# Patient Record
Sex: Female | Born: 1965 | Race: White | Hispanic: No | Smoking: Never smoker
Health system: Southern US, Community
[De-identification: ages and names within clinical notes are randomized; demographics above are authoritative.]

## PROBLEM LIST (undated history)

## (undated) DIAGNOSIS — F32A Depression, unspecified: Secondary | ICD-10-CM

## (undated) DIAGNOSIS — T8859XA Other complications of anesthesia, initial encounter: Secondary | ICD-10-CM

## (undated) DIAGNOSIS — Z9889 Other specified postprocedural states: Secondary | ICD-10-CM

## (undated) DIAGNOSIS — K219 Gastro-esophageal reflux disease without esophagitis: Secondary | ICD-10-CM

## (undated) DIAGNOSIS — C50919 Malignant neoplasm of unspecified site of unspecified female breast: Secondary | ICD-10-CM

## (undated) DIAGNOSIS — F329 Major depressive disorder, single episode, unspecified: Secondary | ICD-10-CM

## (undated) DIAGNOSIS — Z9221 Personal history of antineoplastic chemotherapy: Secondary | ICD-10-CM

## (undated) DIAGNOSIS — F419 Anxiety disorder, unspecified: Secondary | ICD-10-CM

## (undated) DIAGNOSIS — Z9882 Breast implant status: Secondary | ICD-10-CM

## (undated) DIAGNOSIS — IMO0002 Reserved for concepts with insufficient information to code with codable children: Secondary | ICD-10-CM

## (undated) DIAGNOSIS — R112 Nausea with vomiting, unspecified: Secondary | ICD-10-CM

## (undated) DIAGNOSIS — T4145XA Adverse effect of unspecified anesthetic, initial encounter: Secondary | ICD-10-CM

## (undated) DIAGNOSIS — Z923 Personal history of irradiation: Secondary | ICD-10-CM

## (undated) HISTORY — DX: Breast implant status: Z98.82

## (undated) HISTORY — PX: OTHER SURGICAL HISTORY: SHX169

## (undated) HISTORY — DX: Malignant neoplasm of unspecified site of unspecified female breast: C50.919

## (undated) HISTORY — DX: Personal history of antineoplastic chemotherapy: Z92.21

## (undated) HISTORY — PX: PORTACATH PLACEMENT: SHX2246

## (undated) HISTORY — DX: Personal history of irradiation: Z92.3

## (undated) HISTORY — DX: Gastro-esophageal reflux disease without esophagitis: K21.9

## (undated) HISTORY — PX: TONSILLECTOMY AND ADENOIDECTOMY: SUR1326

---

## 2000-11-04 ENCOUNTER — Other Ambulatory Visit: Admission: RE | Admit: 2000-11-04 | Discharge: 2000-11-04 | Payer: Self-pay | Admitting: Obstetrics & Gynecology

## 2000-12-15 DIAGNOSIS — C50919 Malignant neoplasm of unspecified site of unspecified female breast: Secondary | ICD-10-CM

## 2000-12-15 HISTORY — DX: Malignant neoplasm of unspecified site of unspecified female breast: C50.919

## 2001-11-08 ENCOUNTER — Other Ambulatory Visit: Admission: RE | Admit: 2001-11-08 | Discharge: 2001-11-08 | Payer: Self-pay | Admitting: Obstetrics & Gynecology

## 2001-11-10 ENCOUNTER — Encounter: Payer: Self-pay | Admitting: Obstetrics & Gynecology

## 2001-11-10 ENCOUNTER — Encounter (INDEPENDENT_AMBULATORY_CARE_PROVIDER_SITE_OTHER): Payer: Self-pay | Admitting: *Deleted

## 2001-11-10 ENCOUNTER — Encounter: Admission: RE | Admit: 2001-11-10 | Discharge: 2001-11-10 | Payer: Self-pay | Admitting: Obstetrics & Gynecology

## 2001-11-10 ENCOUNTER — Other Ambulatory Visit: Admission: RE | Admit: 2001-11-10 | Discharge: 2001-11-10 | Payer: Self-pay | Admitting: Obstetrics & Gynecology

## 2001-11-12 ENCOUNTER — Encounter: Admission: RE | Admit: 2001-11-12 | Discharge: 2001-11-12 | Payer: Self-pay | Admitting: Obstetrics & Gynecology

## 2001-11-12 ENCOUNTER — Encounter: Payer: Self-pay | Admitting: Obstetrics & Gynecology

## 2001-11-14 HISTORY — PX: BREAST LUMPECTOMY: SHX2

## 2001-11-14 HISTORY — PX: SENTINEL LYMPH NODE BIOPSY: SHX2392

## 2001-11-17 ENCOUNTER — Encounter: Payer: Self-pay | Admitting: Surgery

## 2001-11-17 ENCOUNTER — Encounter: Admission: RE | Admit: 2001-11-17 | Discharge: 2001-11-17 | Payer: Self-pay | Admitting: Surgery

## 2001-11-17 ENCOUNTER — Ambulatory Visit: Admission: RE | Admit: 2001-11-17 | Discharge: 2002-02-15 | Payer: Self-pay | Admitting: Radiation Oncology

## 2001-11-19 ENCOUNTER — Encounter (INDEPENDENT_AMBULATORY_CARE_PROVIDER_SITE_OTHER): Payer: Self-pay | Admitting: Specialist

## 2001-11-19 ENCOUNTER — Encounter: Admission: RE | Admit: 2001-11-19 | Discharge: 2001-11-19 | Payer: Self-pay | Admitting: Otolaryngology

## 2001-11-19 ENCOUNTER — Ambulatory Visit (HOSPITAL_BASED_OUTPATIENT_CLINIC_OR_DEPARTMENT_OTHER): Admission: RE | Admit: 2001-11-19 | Discharge: 2001-11-19 | Payer: Self-pay | Admitting: Surgery

## 2001-11-19 ENCOUNTER — Encounter: Payer: Self-pay | Admitting: Surgery

## 2001-12-15 HISTORY — PX: BREAST ENHANCEMENT SURGERY: SHX7

## 2001-12-16 ENCOUNTER — Encounter: Payer: Self-pay | Admitting: *Deleted

## 2001-12-16 ENCOUNTER — Ambulatory Visit (HOSPITAL_COMMUNITY): Admission: RE | Admit: 2001-12-16 | Discharge: 2001-12-16 | Payer: Self-pay | Admitting: *Deleted

## 2001-12-17 ENCOUNTER — Ambulatory Visit (HOSPITAL_BASED_OUTPATIENT_CLINIC_OR_DEPARTMENT_OTHER): Admission: RE | Admit: 2001-12-17 | Discharge: 2001-12-17 | Payer: Self-pay | Admitting: Surgery

## 2001-12-18 ENCOUNTER — Emergency Department (HOSPITAL_COMMUNITY): Admission: EM | Admit: 2001-12-18 | Discharge: 2001-12-18 | Payer: Self-pay | Admitting: Emergency Medicine

## 2001-12-18 ENCOUNTER — Encounter: Payer: Self-pay | Admitting: Surgery

## 2001-12-19 ENCOUNTER — Emergency Department (HOSPITAL_COMMUNITY): Admission: EM | Admit: 2001-12-19 | Discharge: 2001-12-19 | Payer: Self-pay | Admitting: Emergency Medicine

## 2002-02-28 ENCOUNTER — Ambulatory Visit: Admission: RE | Admit: 2002-02-28 | Discharge: 2002-05-29 | Payer: Self-pay | Admitting: Radiation Oncology

## 2002-11-09 ENCOUNTER — Other Ambulatory Visit: Admission: RE | Admit: 2002-11-09 | Discharge: 2002-11-09 | Payer: Self-pay | Admitting: Obstetrics & Gynecology

## 2002-11-14 ENCOUNTER — Encounter: Payer: Self-pay | Admitting: Oncology

## 2002-11-14 ENCOUNTER — Encounter: Admission: RE | Admit: 2002-11-14 | Discharge: 2002-11-14 | Payer: Self-pay | Admitting: Oncology

## 2003-01-05 ENCOUNTER — Ambulatory Visit: Admission: RE | Admit: 2003-01-05 | Discharge: 2003-01-05 | Payer: Self-pay | Admitting: Radiation Oncology

## 2003-05-23 ENCOUNTER — Encounter: Admission: RE | Admit: 2003-05-23 | Discharge: 2003-05-23 | Payer: Self-pay | Admitting: Oncology

## 2003-05-23 ENCOUNTER — Encounter: Payer: Self-pay | Admitting: Oncology

## 2003-11-13 ENCOUNTER — Other Ambulatory Visit: Admission: RE | Admit: 2003-11-13 | Discharge: 2003-11-13 | Payer: Self-pay | Admitting: Obstetrics & Gynecology

## 2003-11-17 ENCOUNTER — Encounter: Admission: RE | Admit: 2003-11-17 | Discharge: 2003-11-17 | Payer: Self-pay | Admitting: Obstetrics & Gynecology

## 2004-11-21 ENCOUNTER — Other Ambulatory Visit: Admission: RE | Admit: 2004-11-21 | Discharge: 2004-11-21 | Payer: Self-pay | Admitting: Obstetrics & Gynecology

## 2004-11-22 ENCOUNTER — Encounter: Admission: RE | Admit: 2004-11-22 | Discharge: 2004-11-22 | Payer: Self-pay | Admitting: Surgery

## 2004-11-22 ENCOUNTER — Other Ambulatory Visit: Admission: RE | Admit: 2004-11-22 | Discharge: 2004-11-22 | Payer: Self-pay | Admitting: Obstetrics & Gynecology

## 2005-01-20 ENCOUNTER — Ambulatory Visit: Payer: Self-pay | Admitting: Oncology

## 2005-07-28 ENCOUNTER — Ambulatory Visit: Payer: Self-pay | Admitting: Oncology

## 2005-09-16 ENCOUNTER — Ambulatory Visit: Payer: Self-pay | Admitting: Oncology

## 2005-11-24 ENCOUNTER — Encounter: Admission: RE | Admit: 2005-11-24 | Discharge: 2005-11-24 | Payer: Self-pay | Admitting: Oncology

## 2005-11-24 ENCOUNTER — Other Ambulatory Visit: Admission: RE | Admit: 2005-11-24 | Discharge: 2005-11-24 | Payer: Self-pay | Admitting: Obstetrics & Gynecology

## 2006-01-26 ENCOUNTER — Ambulatory Visit: Payer: Self-pay | Admitting: Oncology

## 2006-02-07 ENCOUNTER — Encounter: Admission: RE | Admit: 2006-02-07 | Discharge: 2006-02-07 | Payer: Self-pay | Admitting: Oncology

## 2006-06-29 ENCOUNTER — Encounter: Admission: RE | Admit: 2006-06-29 | Discharge: 2006-06-29 | Payer: Self-pay | Admitting: Oncology

## 2006-07-23 ENCOUNTER — Ambulatory Visit: Payer: Self-pay | Admitting: Oncology

## 2006-07-28 LAB — CBC WITH DIFFERENTIAL/PLATELET
BASO%: 0.3 % (ref 0.0–2.0)
Basophils Absolute: 0 10*3/uL (ref 0.0–0.1)
EOS%: 3.1 % (ref 0.0–7.0)
Eosinophils Absolute: 0.2 10*3/uL (ref 0.0–0.5)
MCH: 29.9 pg (ref 26.0–34.0)
MCHC: 34.1 g/dL (ref 32.0–36.0)
MCV: 87.6 fL (ref 81.0–101.0)
MONO#: 0.5 10*3/uL (ref 0.1–0.9)
NEUT#: 4.3 10*3/uL (ref 1.5–6.5)
Platelets: 240 10*3/uL (ref 145–400)
RDW: 14 % (ref 11.3–14.5)
lymph#: 2.3 10*3/uL (ref 0.9–3.3)

## 2006-07-28 LAB — COMPREHENSIVE METABOLIC PANEL
ALT: 15 U/L (ref 0–40)
Albumin: 4.7 g/dL (ref 3.5–5.2)
Calcium: 9.5 mg/dL (ref 8.4–10.5)
Chloride: 103 mEq/L (ref 96–112)
Potassium: 3.7 mEq/L (ref 3.5–5.3)

## 2006-11-30 ENCOUNTER — Encounter: Admission: RE | Admit: 2006-11-30 | Discharge: 2006-11-30 | Payer: Self-pay | Admitting: Oncology

## 2007-01-14 ENCOUNTER — Ambulatory Visit: Payer: Self-pay | Admitting: Oncology

## 2007-01-19 LAB — COMPREHENSIVE METABOLIC PANEL
AST: 16 U/L (ref 0–37)
Albumin: 4.3 g/dL (ref 3.5–5.2)
CO2: 23 mEq/L (ref 19–32)
Chloride: 105 mEq/L (ref 96–112)
Creatinine, Ser: 0.87 mg/dL (ref 0.40–1.20)
Glucose, Bld: 88 mg/dL (ref 70–99)

## 2007-01-19 LAB — CBC WITH DIFFERENTIAL/PLATELET
EOS%: 2.9 % (ref 0.0–7.0)
HGB: 13.7 g/dL (ref 11.6–15.9)
LYMPH%: 32.4 % (ref 14.0–48.0)
MCHC: 34.9 g/dL (ref 32.0–36.0)
MCV: 87 fL (ref 81.0–101.0)
MONO#: 0.6 10*3/uL (ref 0.1–0.9)
MONO%: 8.1 % (ref 0.0–13.0)
NEUT#: 4.2 10*3/uL (ref 1.5–6.5)
NEUT%: 56.2 % (ref 39.6–76.8)
Platelets: 252 10*3/uL (ref 145–400)
RBC: 4.49 10*6/uL (ref 3.70–5.32)
RDW: 13.9 % (ref 11.3–14.5)
lymph#: 2.4 10*3/uL (ref 0.9–3.3)

## 2007-07-16 ENCOUNTER — Ambulatory Visit: Payer: Self-pay | Admitting: Oncology

## 2007-07-20 LAB — COMPREHENSIVE METABOLIC PANEL
ALT: 16 U/L (ref 0–35)
Alkaline Phosphatase: 60 U/L (ref 39–117)
CO2: 17 mEq/L — ABNORMAL LOW (ref 19–32)
Creatinine, Ser: 0.88 mg/dL (ref 0.40–1.20)
Total Bilirubin: 0.7 mg/dL (ref 0.3–1.2)

## 2007-07-20 LAB — CBC WITH DIFFERENTIAL/PLATELET
BASO%: 0.3 % (ref 0.0–2.0)
HCT: 40.2 % (ref 34.8–46.6)
LYMPH%: 21.3 % (ref 14.0–48.0)
MCH: 30.8 pg (ref 26.0–34.0)
MCHC: 34.6 g/dL (ref 32.0–36.0)
MCV: 88.9 fL (ref 81.0–101.0)
MONO#: 0.6 10*3/uL (ref 0.1–0.9)
MONO%: 7.4 % (ref 0.0–13.0)
NEUT%: 68.4 % (ref 39.6–76.8)
Platelets: 203 10*3/uL (ref 145–400)
WBC: 7.5 10*3/uL (ref 3.9–10.0)

## 2007-07-20 LAB — CANCER ANTIGEN 27.29: CA 27.29: 16 U/mL (ref 0–39)

## 2007-12-02 ENCOUNTER — Encounter: Admission: RE | Admit: 2007-12-02 | Discharge: 2007-12-02 | Payer: Self-pay | Admitting: Oncology

## 2008-01-14 ENCOUNTER — Ambulatory Visit: Payer: Self-pay | Admitting: Oncology

## 2008-01-18 LAB — COMPREHENSIVE METABOLIC PANEL
ALT: 10 U/L (ref 0–35)
Albumin: 4.4 g/dL (ref 3.5–5.2)
Alkaline Phosphatase: 68 U/L (ref 39–117)
BUN: 16 mg/dL (ref 6–23)
CO2: 23 mEq/L (ref 19–32)
Chloride: 107 mEq/L (ref 96–112)
Creatinine, Ser: 0.97 mg/dL (ref 0.40–1.20)
Potassium: 4.1 mEq/L (ref 3.5–5.3)
Total Bilirubin: 0.3 mg/dL (ref 0.3–1.2)

## 2008-01-18 LAB — CBC WITH DIFFERENTIAL/PLATELET
Basophils Absolute: 0 10*3/uL (ref 0.0–0.1)
EOS%: 4.3 % (ref 0.0–7.0)
HCT: 42.1 % (ref 34.8–46.6)
HGB: 14.4 g/dL (ref 11.6–15.9)
LYMPH%: 36.2 % (ref 14.0–48.0)
NEUT#: 2.3 10*3/uL (ref 1.5–6.5)
NEUT%: 46.9 % (ref 39.6–76.8)
Platelets: 204 10*3/uL (ref 145–400)
WBC: 4.9 10*3/uL (ref 3.9–10.0)
lymph#: 1.8 10*3/uL (ref 0.9–3.3)

## 2008-12-04 ENCOUNTER — Encounter: Admission: RE | Admit: 2008-12-04 | Discharge: 2008-12-04 | Payer: Self-pay | Admitting: Oncology

## 2009-02-26 ENCOUNTER — Ambulatory Visit: Payer: Self-pay | Admitting: Oncology

## 2009-02-28 LAB — CBC WITH DIFFERENTIAL/PLATELET
BASO%: 0.1 % (ref 0.0–2.0)
EOS%: 2.9 % (ref 0.0–7.0)
HCT: 42.8 % (ref 34.8–46.6)
HGB: 14.8 g/dL (ref 11.6–15.9)
LYMPH%: 29.4 % (ref 14.0–49.7)
MCH: 30 pg (ref 25.1–34.0)
MCHC: 34.6 g/dL (ref 31.5–36.0)
NEUT#: 5.8 10*3/uL (ref 1.5–6.5)
nRBC: 0 % (ref 0–0)

## 2009-02-28 LAB — COMPREHENSIVE METABOLIC PANEL
Albumin: 4.5 g/dL (ref 3.5–5.2)
BUN: 14 mg/dL (ref 6–23)
Sodium: 138 mEq/L (ref 135–145)
Total Bilirubin: 0.3 mg/dL (ref 0.3–1.2)
Total Protein: 7.2 g/dL (ref 6.0–8.3)

## 2009-02-28 LAB — CANCER ANTIGEN 27.29: CA 27.29: 17 U/mL (ref 0–39)

## 2009-04-09 LAB — HEPATIC FUNCTION PANEL
ALT: 20 U/L (ref 0–35)
Albumin: 4.3 g/dL (ref 3.5–5.2)
Total Bilirubin: 0.4 mg/dL (ref 0.3–1.2)

## 2009-12-06 ENCOUNTER — Encounter: Admission: RE | Admit: 2009-12-06 | Discharge: 2009-12-06 | Payer: Self-pay | Admitting: Oncology

## 2010-02-15 ENCOUNTER — Ambulatory Visit: Payer: Self-pay | Admitting: Oncology

## 2010-02-19 LAB — CBC WITH DIFFERENTIAL/PLATELET
HCT: 42.6 % (ref 34.8–46.6)
LYMPH%: 28.2 % (ref 14.0–49.7)
MCHC: 34.6 g/dL (ref 31.5–36.0)
MONO%: 7.8 % (ref 0.0–14.0)
NEUT#: 4.2 10*3/uL (ref 1.5–6.5)
Platelets: 191 10*3/uL (ref 145–400)
WBC: 6.9 10*3/uL (ref 3.9–10.3)

## 2010-02-19 LAB — COMPREHENSIVE METABOLIC PANEL
AST: 17 U/L (ref 0–37)
BUN: 12 mg/dL (ref 6–23)
CO2: 19 mEq/L (ref 19–32)
Chloride: 106 mEq/L (ref 96–112)
Creatinine, Ser: 0.84 mg/dL (ref 0.40–1.20)
Glucose, Bld: 88 mg/dL (ref 70–99)
Potassium: 4.2 mEq/L (ref 3.5–5.3)
Sodium: 138 mEq/L (ref 135–145)

## 2010-12-10 ENCOUNTER — Encounter
Admission: RE | Admit: 2010-12-10 | Discharge: 2010-12-10 | Payer: Self-pay | Source: Home / Self Care | Attending: Oncology | Admitting: Oncology

## 2011-01-03 ENCOUNTER — Other Ambulatory Visit: Payer: Self-pay | Admitting: Obstetrics & Gynecology

## 2011-01-05 ENCOUNTER — Encounter: Payer: Self-pay | Admitting: Oncology

## 2011-01-05 ENCOUNTER — Encounter: Payer: Self-pay | Admitting: Orthopaedic Surgery

## 2011-02-20 ENCOUNTER — Other Ambulatory Visit: Payer: Self-pay | Admitting: Oncology

## 2011-02-20 ENCOUNTER — Encounter (HOSPITAL_BASED_OUTPATIENT_CLINIC_OR_DEPARTMENT_OTHER): Payer: 59 | Admitting: Oncology

## 2011-02-20 DIAGNOSIS — Z853 Personal history of malignant neoplasm of breast: Secondary | ICD-10-CM

## 2011-02-20 LAB — COMPREHENSIVE METABOLIC PANEL
ALT: 20 U/L (ref 0–35)
AST: 19 U/L (ref 0–37)
CO2: 24 mEq/L (ref 19–32)
Calcium: 9.2 mg/dL (ref 8.4–10.5)
Chloride: 105 mEq/L (ref 96–112)
Creatinine, Ser: 0.73 mg/dL (ref 0.40–1.20)
Glucose, Bld: 117 mg/dL — ABNORMAL HIGH (ref 70–99)
Sodium: 137 mEq/L (ref 135–145)
Total Bilirubin: 0.5 mg/dL (ref 0.3–1.2)
Total Protein: 7.1 g/dL (ref 6.0–8.3)

## 2011-02-20 LAB — CBC WITH DIFFERENTIAL/PLATELET
Basophils Absolute: 0 10*3/uL (ref 0.0–0.1)
MCH: 30.5 pg (ref 25.1–34.0)
MCV: 89.4 fL (ref 79.5–101.0)
RBC: 4.64 10*6/uL (ref 3.70–5.45)

## 2011-02-20 LAB — CANCER ANTIGEN 27.29: CA 27.29: 23 U/mL (ref 0–39)

## 2011-02-27 ENCOUNTER — Encounter (HOSPITAL_BASED_OUTPATIENT_CLINIC_OR_DEPARTMENT_OTHER): Payer: 59 | Admitting: Oncology

## 2011-02-27 DIAGNOSIS — Z853 Personal history of malignant neoplasm of breast: Secondary | ICD-10-CM

## 2011-05-02 NOTE — Op Note (Signed)
[place]  Patient:    Christina Blackwell, Christina Blackwell Visit Number: [id number] MRN: [id number]          Service Type: DSU Location: [facility] Attending Physician:  Andre Lefort Dictated by:   Sandria Bales. Ezzard Standing, M.D. Proc. Date: 11/19/01 Admit Date:  11/19/2001   CC:         Christina Blackwell, M.D.  Christina Blackwell. Aldona Bar, M.D.  Christina Blackwell, M.D.   Operative Report  DATE OF BIRTH:  25-Nov-1966  PREOPERATIVE DIAGNOSIS:  Carcinoma of left breast 12 oclock position.  POSTOPERATIVE DIAGNOSIS:  Carcinoma of left breast 12 oclock position with grossly clear margins and negative sentinel lymph node.  PROCEDURE:  Left partial mastectomy (lumpectomy), with needle localization, injection of ______ blue, removal of sentinel lymph nodes.  SURGEON:  Sandria Bales. Ezzard Standing, M.D.  ANESTHESIA:  LMA general.  COMPLICATIONS:  None.  INDICATIONS FOR PROCEDURE:  [name] is a 45 year old white female who has a biopsy proven carcinoma of her left breast.  This mass is barely palpable, therefore, I used a needle localization from [facility] to identify the mass.  She was also injected with radioisotope as a marker for identifying a sentinel lymph node.  DESCRIPTION OF PROCEDURE:  The patient presented to the OR at [facility] with a wire coming from her left breast at the 2 oclock position with an X over the 12 oclock position approximately 3 to 4 cm above the edge of the areola.  I injected around the nipple with ______ blue using about 1 cc total.  Four injections sites around the nipple, and directly over the tumor site.  I then prepped the left breast with Betadine solution and sterilely draped.  I had also identified before draping it a hot spot in her left axilla which I thought would coincide with her sentinel lymph node.  At first, after the breast was prepped and draped was towards the sentinel lymph node, I took out some fatty tissue which did  not appear to have lymph node, and I sent this for permanent to identify the approximately 1 cm hot blue node off the edge of the pectoralis major muscle and her left axilla.  The node had counts of 7200, with a background of 15, and was blue in color.  The sentinel lymph had a touch prep by Dr. Jimmy Picket, and he said this was a negative touch prep.  The wound was then irrigated, and closed with 3-0 Vicryl suture and a 5-0 subcuticular Monocryl suture.  I then turned my attention to the breast where I tried to excise at least a 2 to 3 cm margin around the area that was palpably abnormal.  Again, I had the guide wire as a marker.  I went down to the chest wall, excising the breast tissue, excising a mass of breast tissue about maybe 8 or 9 cm, about 3 cm deep.  I placed a short suture medially, a long suture cephalad, and the wire came out lateral. I then sent it for specimen mammography, where they confirmed the mass had been excised, and sent it for a gross examination of the margins where Dr. Luisa Hart felt that I had at least 1 cm margins, actually the closest margin was a deep margin of which I was already down to the chest wall.  I then irrigated the wound.  I marked the borders of the lumpectomy site or partial mastectomy site with medium clips.  I then closed the subcutaneous tissues with 3-0 Vicryl suture, and the skin with a 5-0 subcuticular Monocryl suture.  I then painted both wounds with Benzoin, placed Steri-Strips, and sterilely dressed the wound.  She will be discharged home today to see me back in 5 to 7 days for a check of her wound and review of her pathology. Dictated by:   Sandria Bales. Ezzard Standing, M.D. Attending Physician:  Andre Lefort DD:  11/19/01 TD:  11/20/01 Job: [id number]

## 2011-05-02 NOTE — Op Note (Signed)
[facility]  Patient:    Christina Blackwell, Christina Blackwell. Visit Number: [id number] MRN: [id number]          Service Type: Attending:  Zigmund Daniel, M.D. Dictated by:   Zigmund Daniel, M.D. Proc. Date: 12/17/01                             Operative Report  PREOPERATIVE DIAGNOSIS:  Breast cancer.  POSTOPERATIVE DIAGNOSIS:  Breast cancer.  PROCEDURE:  Implantation of a central venous access port.  SURGEON:  Zigmund Daniel, M.D.  ANESTHESIA:  Local with sedation.  DESCRIPTION OF PROCEDURE:  After the patient was adequately monitored and sedated and had routine preparation and draping of the anterior chest and neck, I infused local anesthetic in the right deltopectoral groove and subclavicular region and anterior chest wall.  I made a small incision in the deltopectoral groove and accessed the subclavian vein on the first stick with the patient in Trendelenburg position.  I passed in a J-wire and confirmed that it went into the superior vena cava.  I then created a pocket for the port and implanted it with two sutures of 2-0 Vicryl, and it lay in a very comfortable and accessible position.  I passed the venous tubing up to the vein access incision and cut it to estimated necessary length fluoroscopically.  Then passed the dilator and introducer assembly over the J-wire, removed the J-wire and the dilator, and passed the venous tubing through the introducer.  It went down easily, and then I stripped away the introducer sheath.  I confirmed that it was in good position without any kinks or twists by fluoroscopic exam.  It allowed easy withdrawal of blood, and the infusion was easy as well.  I flushed it with concentrated heparin solution. I closed the skin with intracuticular 4-0 Vicryl and Steri-Strips for both incisions and concluded the procedure.  The patient tolerated the operation well. Dictated by:   Zigmund Daniel, M.D. Attending:   Zigmund Daniel, M.D. DD:  12/17/01 TD:  12/17/01 Job: [id number]

## 2011-11-11 ENCOUNTER — Other Ambulatory Visit: Payer: Self-pay | Admitting: Oncology

## 2011-11-11 DIAGNOSIS — Z1231 Encounter for screening mammogram for malignant neoplasm of breast: Secondary | ICD-10-CM

## 2011-11-20 ENCOUNTER — Telehealth: Payer: Self-pay | Admitting: *Deleted

## 2011-11-20 NOTE — Telephone Encounter (Signed)
left voice messge to inform the patient of  the new date and time on 04-29-2012 at 3:30pm dr.magrinat 04-22-2012 for labs at 4:00pm

## 2011-12-12 ENCOUNTER — Ambulatory Visit: Payer: 59

## 2011-12-23 ENCOUNTER — Inpatient Hospital Stay: Admission: RE | Admit: 2011-12-23 | Payer: 59 | Source: Ambulatory Visit

## 2011-12-24 ENCOUNTER — Ambulatory Visit: Payer: 59

## 2011-12-31 ENCOUNTER — Ambulatory Visit: Admission: RE | Admit: 2011-12-31 | Payer: 59 | Source: Ambulatory Visit

## 2011-12-31 ENCOUNTER — Ambulatory Visit
Admission: RE | Admit: 2011-12-31 | Discharge: 2011-12-31 | Disposition: A | Discharge status: Home / Self Care | Payer: 59 | Source: Ambulatory Visit | Attending: Oncology | Admitting: Oncology

## 2011-12-31 ENCOUNTER — Other Ambulatory Visit: Payer: Self-pay | Admitting: Oncology

## 2011-12-31 DIAGNOSIS — Z1239 Encounter for other screening for malignant neoplasm of breast: Secondary | ICD-10-CM

## 2012-01-06 ENCOUNTER — Other Ambulatory Visit: Payer: Self-pay | Admitting: Oncology

## 2012-01-06 DIAGNOSIS — R928 Other abnormal and inconclusive findings on diagnostic imaging of breast: Secondary | ICD-10-CM

## 2012-01-09 ENCOUNTER — Other Ambulatory Visit: Payer: Self-pay | Admitting: Oncology

## 2012-01-09 ENCOUNTER — Ambulatory Visit
Admission: RE | Admit: 2012-01-09 | Discharge: 2012-01-09 | Disposition: A | Discharge status: Home / Self Care | Payer: 59 | Source: Ambulatory Visit | Attending: Oncology | Admitting: Oncology

## 2012-01-09 DIAGNOSIS — R928 Other abnormal and inconclusive findings on diagnostic imaging of breast: Secondary | ICD-10-CM

## 2012-01-12 ENCOUNTER — Other Ambulatory Visit: Payer: Self-pay | Admitting: Oncology

## 2012-01-12 ENCOUNTER — Ambulatory Visit
Admission: RE | Admit: 2012-01-12 | Discharge: 2012-01-12 | Disposition: A | Discharge status: Home / Self Care | Payer: 59 | Source: Ambulatory Visit | Attending: Oncology | Admitting: Oncology

## 2012-01-12 DIAGNOSIS — R928 Other abnormal and inconclusive findings on diagnostic imaging of breast: Secondary | ICD-10-CM

## 2012-01-12 DIAGNOSIS — C50911 Malignant neoplasm of unspecified site of right female breast: Secondary | ICD-10-CM

## 2012-01-16 ENCOUNTER — Ambulatory Visit: Payer: 59

## 2012-01-16 ENCOUNTER — Ambulatory Visit (HOSPITAL_BASED_OUTPATIENT_CLINIC_OR_DEPARTMENT_OTHER): Payer: 59 | Admitting: Oncology

## 2012-01-16 ENCOUNTER — Ambulatory Visit
Admission: RE | Admit: 2012-01-16 | Discharge: 2012-01-16 | Disposition: A | Discharge status: Home / Self Care | Payer: 59 | Source: Ambulatory Visit | Attending: Oncology | Admitting: Oncology

## 2012-01-16 VITALS — BP 131/85 | HR 90 | Temp 98.7°F | Ht 65.0 in | Wt 164.9 lb

## 2012-01-16 DIAGNOSIS — Z923 Personal history of irradiation: Secondary | ICD-10-CM

## 2012-01-16 DIAGNOSIS — Z170 Estrogen receptor positive status [ER+]: Secondary | ICD-10-CM

## 2012-01-16 DIAGNOSIS — C50911 Malignant neoplasm of unspecified site of right female breast: Secondary | ICD-10-CM

## 2012-01-16 DIAGNOSIS — C50919 Malignant neoplasm of unspecified site of unspecified female breast: Secondary | ICD-10-CM

## 2012-01-16 DIAGNOSIS — Z853 Personal history of malignant neoplasm of breast: Secondary | ICD-10-CM

## 2012-01-16 MED ORDER — GADOBENATE DIMEGLUMINE 529 MG/ML IV SOLN
15.0000 mL | Freq: Once | INTRAVENOUS | Status: AC | PRN
  Administered 2012-01-16: 15 mL via INTRAVENOUS

## 2012-01-16 NOTE — Progress Notes (Signed)
Christina Blackwell  DOB: 23-Jun-1966  MR#: [id number]  CSN#: [id number]    History of present illness:   Christina Blackwell is a 46 year old [place] woman formerly followed here for a left-sided stage I invasive ductal carcinoma, which was treated with adjuvant chemotherapy completed in 2003. She was released from followup last year.  On 12/28/2011 she had bilateral screening mammography showing a possible area of distortion in the right breast. She was recalled for additional views 01/09/2012 and this confirmed an area of architectural distortion in the lateral aspect of the right breast measuring 2.9 cm. This was not palpable by exam. Biopsy was performed the same day and showed ([id number]) and invasive lobular breast cancer, e-cadherin negative, strongly estrogen receptor positive at 85%, progesterone receptor positive at 98%, with an MIB-1 of 9%, and no HER-2 amplification.  With this information the patient has been set up for bilateral breast MRIs later today, and to see Dr. Ovidio Kin next week to discuss surgical options.  Past medical history:    Prior left-sided breast cancer as discussed above, with bilateral implant placement; history of GERD, history of tonsillectomy and adenoidectomy; history of port placement and removal remotely.  Family history:   The patient's father is alive at age 53 the patient's mother is alive at age 24. She was diagnosed with breast cancer at age 20, and is doing well. The patient is an only child. One of the patient's father's 4 sisters was diagnosed with breast cancer at the age of 23.   Gynecologic history:   Menarche age 69, she is GX P0. She is currently using a Mirena implant   Social history:   She works for an [employer], but her job was recently eliminated, and she is temporarily employed by them at present. She is divorced and lives by herself, with 2 birds, and [place] parrot and a cockateal. 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    ADVANCED  DIRECTIVES:  Health maintenance:       History  Substance Use Topics  . Smoking status: Not on file  . Smokeless tobacco: Not on file  . Alcohol Use: Not on file   she never smoked. She drinks one or 2 beers a week at most. She tells me her cholesterol is okay. Pap smear is due January of this year under Dr. Aldona Bar. She has never had a colonoscopy or bone density.     Review of systems:  She had no symptoms at the time of mammography. She feels a little tired and currently very stressed of course. Otherwise a detailed review of systems today was entirely negative.  Allergies:    No Known Allergies  Medications:      Current Outpatient Prescriptions  Medication Sig Dispense Refill  . zolpidem (AMBIEN) 5 MG tablet Take 5 mg by mouth at bedtime as needed.        Physical exam:  Middle-aged white woman who is tearrful    Filed Vitals:   01/16/12 1409  BP: 131/85  Pulse: 90  Temp: 98.7 F (37.1 C)     Body mass index is 27.44 kg/(m^2).  ECOG PS:  Sclerae unicteric Oropharynx clear No peripheral adenopathy Lungs no rales or rhonchi Heart regular rate and rhythm Abd benign MSK no focal spinal tenderness, no peripheral edema Neuro: nonfocal Breasts: Right breast shows ecchymosis in the lower outer quadrant. There is no palpable mass there. There are no skin changes or nipple retraction. The right axilla is unremarkable. The left breast is  status post remote lumpectomy. There is no evidence of local recurrence.   Lab results:      CBC No results found for this basename: WBC:5,HGB:5,HCT:5,PLT:5,MCV:5,MCH:5,MCHC:5,RDW:5,NEUTRABS:5,LYMPHSABS:5,MONOABS:5,EOSABS:5,BASOSABS:5,BANDABS:5,BANDSABD:5 in the last 168 hours  Chemistries  No results found for this basename: NA:5,K:5,CL:5,CO2:5,GLUCOSE:5,BUN:5,CREATININE:5,CALCIUM:5,MG:5 in the last 168 hours   Studies:    01/09/2012  *RADIOLOGY REPORT*  Clinical Data:  Possible right breast architectural distortion at recent screening  mammography.  Status post left lumpectomy, radiation therapy and chemotherapy for breast cancer in 2002.  DIGITAL DIAGNOSTIC RIGHT MAMMOGRAM  AND RIGHT BREAST ULTRASOUND:  Comparison:  Previous examinations, including the screening mammogram dated 12/31/2011.  Findings:  Spot compression views of the right breast confirm an area of architectural distortion and increased density in the outer right breast.  On physical exam, no mass is palpable in the outer right breast.  Ultrasound is performed, showing a 2.9 x 2.0 x 1.5 cm irregular hypoechoic area in the 9 o'clock position of the right breast, 8 cm from the nipple.  This corresponds to the architectural distortion and increased density seen mammographically.  IMPRESSION: 2.9 cm irregular hypoechoic area with architectural distortion in the 9 o'clock position of the right breast.  This is suspicious for malignancy.  Ultrasound-guided core needle biopsy is recommended and scheduled to follow.  BI-RADS CATEGORY 4:  Suspicious abnormality - biopsy should be considered.  Recommendation:  Right breast ultrasound guided core needle biopsy (scheduled to follow).  Original Report Authenticated By: Darrol Angel, M.D.     Assessment: 46 year old [place] woman status post left lumpectomy and sentinel lymph node sampling December of 2002 for a T1 N0, stage I invasive ductal carcinoma, triple negative, treated with dose dense cyclophosphamide and doxorubicin x4 followed by radiation all treatment completed in June of 2003; now with a right breast invasive lobular breast cancer, clinically T2 N0, strongly estrogen and progesterone receptor positive, HER-2 negative, with an MIB-1 of less than 10.      Plan: We spent the better part of the visit discussing her cancer. She understands this is a completely different cancer and not related to the earlier one. It is of concern that she has bilateral breast cancers and a first degree relative with breast cancer. We had  originally offered her genetic testing in 2002 but she could not afford it at that time. I think she will easily meet the criteria now and that appointment is being scheduled. She is very likely to need to have her ovaries and fallopian tubes removed. She is planning to get her Mirena exchanged next week, and that probably should not be replaced, given the above.  This cancer is a very low proliferation fraction is lobular, as well as strongly estrogen and progesterone receptor positive. If she proves to be lymph node negative, I will send an Oncotype I. an effort to spare her of further chemotherapy. On the other hand she is likely to get significant benefit from anti-estrogens, and I would likely treat her on a ten-year plan, with 5 years of tamoxifen followed by 5 years of an aromatase inhibitor.  On the assumption that she will have surgery within the next 2 or 3 weeks and that the Oncotype DX about 2 weeks for results I have made her return appointment with me in March. We should double the information available at that point to make a definitive decision regarding adjuvant treatment Brenan Modesto C 01/16/2012

## 2012-01-20 ENCOUNTER — Encounter (INDEPENDENT_AMBULATORY_CARE_PROVIDER_SITE_OTHER): Payer: Self-pay

## 2012-01-22 ENCOUNTER — Other Ambulatory Visit (INDEPENDENT_AMBULATORY_CARE_PROVIDER_SITE_OTHER): Payer: Self-pay | Admitting: Surgery

## 2012-01-22 ENCOUNTER — Encounter (INDEPENDENT_AMBULATORY_CARE_PROVIDER_SITE_OTHER): Payer: Self-pay | Admitting: Surgery

## 2012-01-22 ENCOUNTER — Telehealth: Payer: Self-pay | Admitting: *Deleted

## 2012-01-22 ENCOUNTER — Ambulatory Visit (INDEPENDENT_AMBULATORY_CARE_PROVIDER_SITE_OTHER): Payer: 59 | Admitting: Surgery

## 2012-01-22 VITALS — BP 124/82 | HR 80 | Temp 98.3°F | Resp 16 | Ht 65.0 in | Wt 165.4 lb

## 2012-01-22 DIAGNOSIS — C50919 Malignant neoplasm of unspecified site of unspecified female breast: Secondary | ICD-10-CM

## 2012-01-22 NOTE — Telephone Encounter (Signed)
Confirmed 01/26/12 genetics appt w/ pt.  Pt a little hesitant and wants to speak w/ Dr. Ezzard Standing about it today. Called [facility] & they will let Zella Ball know to address the issue today.

## 2012-01-22 NOTE — Progress Notes (Signed)
 Re:   Christina Blackwell DOB:   02/19/1966 MRN:   [id number]  ASSESSMENT AND PLAN: 1.  Right breast cancer.  Lobular.  About 7 o'clock position.  ER - 85%.  PR - 98%. Ki67 - 9%.  Her2Neu - neg.  Saw Dr. G. Magrinat.  This area measures 3.9 cm by MRI.  She'll need a generous lumpectomy or a mastectomy. This makes it a T2 tumor.  I discussed the options for breast cancer treatment with the patient.  I discussed the idea of a multidisciplinary approach to the treatment of breast cancer, which often includes medical oncology and radiation oncology.  I discussed the surgical options of lumpectomy vs. mastectomy.  I discussed the options of lymph node biopsy.  The treatment plan depends on the pathologic staging of the tumor and the patient's personal wishes.  The risks of surgery include, but are not limited to, bleeding, infection, the need for further surgery, and nerve injury.  The patient has been given literature on the treatment of breast cancer.  I talked about the genetic counseling/testing.  Though large on MRI, I think she is a candidate for lumpectomy (needle loc) and we will set this up.  2.  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 Had cyclophosphamide and doxorubicin.  Radiation by Dr. M. Manning. 3.  Has subpectoral implants.  By Dr. H. Holderness. 4.  She had a lot of pain with the nuc med injection.  I gave her some EMLA cream    Chief Complaint  Patient presents with  . Breast Cancer    eval rt breast cancer   REFERRING PHYSICIAN:   Dr. G. Magrinat  HISTORY OF PRESENT ILLNESS: Christina Blackwell is a 46 y.o. (DOB: 07/30/1966)  white female whose primary care physician is  Lori Beane, PA, [facility] [place], and comes to me today for new right sided breast cancer. This new right breast cancer was found on a screening mammogram.  She could not feel anything.  She has no concerns of her left breast.   Her right breast biopsy showed Right breast cancer,  Lobular.  ER - 85%.  PR - 98%. Ki67 - 9%.  Her2Neu - neg.  Uses Mirena implant. Mother had breast cancer at age 60. (about 3 years ago) Father's sister had breast cancer in her 60's.  First cousin had breast cancer in her 50's.  She went through BRCA testing, which was negative.    Past Medical History  Diagnosis Date  . Breast cancer 2002    left   . Breast cancer 2012    right  . History of bilateral breast implants   . GERD (gastroesophageal reflux disease)       Past Surgical History  Procedure Date  . Portacath placement   . Portacath removal   . Tonsillectomy and adenoidectomy   . Breast lumpectomy 11/2001    left  . Sentinel lymph node biopsy 11/2001    left axillary  . Breast enhancement surgery 2003    bilateral      Current Outpatient Prescriptions  Medication Sig Dispense Refill  . LORazepam (ATIVAN) 0.5 MG tablet Take 0.5 mg by mouth as needed.      . zolpidem (AMBIEN) 5 MG tablet Take 5 mg by mouth at bedtime as needed.         No Known Allergies  REVIEW OF SYSTEMS: Skin:  No history of rash.  No history of abnormal moles. Infection:  No   history of hepatitis or HIV.  No history of MRSA. Neurologic:  No history of stroke.  No history of seizure.  No history of headaches. Cardiac:  No history of hypertension. No history of heart disease.  No history of prior cardiac catheterization.  No history of seeing a cardiologist. Pulmonary:  Does not smoke cigarettes.  No asthma or bronchitis.  No OSA/CPAP.  Endocrine:  No diabetes. No thyroid disease. Gastrointestinal:  No history of stomach disease.  No history of liver disease.  No history of gall bladder disease.  No history of pancreas disease.  No history of colon disease. Urologic:  Kindey stones at age 11. Musculoskeletal:  No history of joint or back disease. Hematologic:  No bleeding disorder.  No history of anemia.  Not anticoagulated. Psycho-social:  The patient is oriented.   The patient has no obvious  psychologic or social impairment to understanding our conversation and plan.  SOCIAL and FAMILY HISTORY: Divorced.  No children. Works for [employer].  PHYSICAL EXAM: BP 124/82  Pulse 80  Temp(Src) 98.3 F (36.8 C) (Temporal)  Resp 16  Ht 5' 5" (1.651 m)  Wt 165 lb 6.4 oz (75.025 kg)  BMI 27.52 kg/m2  General: WN WF who is alert and generally healthy appearing.  HEENT: Normal. Pupils equal. Good dentition. Neck: Supple. No mass.  No thyroid mass.  Carotid pulse okay with no bruit. Lymph Nodes:  No supraclavicular or cervical nodes. Lungs: Clear to auscultation and symmetric breath sounds. Heart:  RRR. No murmur or rub. Breasts:  Right:  Bruise at  6 o'clock and tender.  But no mass.  Left:  No mass or nipple discharge.  Abdomen: Soft. No mass. No tenderness. No hernia. Normal bowel sounds.  No abdominal scars. Rectal: Not done. Extremities:  Good strength and ROM  in upper and lower extremities. Neurologic:  Grossly intact to motor and sensory function. Psychiatric: Has normal mood and affect. Behavior is normal.   DATA REVIEWED: Path and x-rays.  Tayanna Talford, MD,  FACS [facility] [address]  [address]   [place], [place]    [address] Phone:  [phone] FAX:  [phone]  

## 2012-01-23 ENCOUNTER — Other Ambulatory Visit (INDEPENDENT_AMBULATORY_CARE_PROVIDER_SITE_OTHER): Payer: Self-pay | Admitting: Surgery

## 2012-01-23 DIAGNOSIS — C50919 Malignant neoplasm of unspecified site of unspecified female breast: Secondary | ICD-10-CM

## 2012-01-26 ENCOUNTER — Ambulatory Visit: Payer: 59

## 2012-01-26 NOTE — Progress Notes (Signed)
Pt. Seen for genetic counseling.  Blood drawn for BRCA 1/2 

## 2012-01-30 ENCOUNTER — Telehealth (INDEPENDENT_AMBULATORY_CARE_PROVIDER_SITE_OTHER): Payer: Self-pay

## 2012-01-30 NOTE — Telephone Encounter (Signed)
The patient called because she is scheduled for surgery next Thursday.  She had her needle bx on January 18th.  She is still very tender and sensitive in the area.  She wanted Dr Ezzard Standing to know before surgery.  She hopes this gets better.  She does not like for anything to touch her skin.  She has been taking Ibuprofen.  I asked her if she tried ice or heat but she does not want to.

## 2012-02-02 ENCOUNTER — Encounter (HOSPITAL_BASED_OUTPATIENT_CLINIC_OR_DEPARTMENT_OTHER): Payer: Self-pay | Admitting: *Deleted

## 2012-02-02 NOTE — Progress Notes (Signed)
No labs needed Had lt lump-snbx 02-screamed during nuc meds-really wants to be sedated

## 2012-02-05 ENCOUNTER — Encounter (HOSPITAL_BASED_OUTPATIENT_CLINIC_OR_DEPARTMENT_OTHER): Payer: Self-pay | Admitting: Anesthesiology

## 2012-02-05 ENCOUNTER — Encounter (HOSPITAL_BASED_OUTPATIENT_CLINIC_OR_DEPARTMENT_OTHER): Payer: Self-pay | Admitting: *Deleted

## 2012-02-05 ENCOUNTER — Ambulatory Visit
Admission: RE | Admit: 2012-02-05 | Discharge: 2012-02-05 | Disposition: A | Discharge status: Home / Self Care | Payer: 59 | Source: Ambulatory Visit | Attending: Surgery | Admitting: Surgery

## 2012-02-05 ENCOUNTER — Ambulatory Visit (HOSPITAL_BASED_OUTPATIENT_CLINIC_OR_DEPARTMENT_OTHER): Payer: 59 | Admitting: Anesthesiology

## 2012-02-05 ENCOUNTER — Other Ambulatory Visit (INDEPENDENT_AMBULATORY_CARE_PROVIDER_SITE_OTHER): Payer: Self-pay | Admitting: Surgery

## 2012-02-05 ENCOUNTER — Ambulatory Visit (HOSPITAL_BASED_OUTPATIENT_CLINIC_OR_DEPARTMENT_OTHER)
Admission: RE | Admit: 2012-02-05 | Discharge: 2012-02-05 | Disposition: A | Discharge status: Home / Self Care | Payer: 59 | Source: Ambulatory Visit | Attending: Surgery | Admitting: Surgery

## 2012-02-05 ENCOUNTER — Encounter (HOSPITAL_BASED_OUTPATIENT_CLINIC_OR_DEPARTMENT_OTHER)
Admission: RE | Disposition: A | Discharge status: Home / Self Care | Payer: Self-pay | Source: Ambulatory Visit | Attending: Surgery

## 2012-02-05 ENCOUNTER — Ambulatory Visit (HOSPITAL_COMMUNITY)
Admission: RE | Admit: 2012-02-05 | Discharge: 2012-02-05 | Disposition: A | Discharge status: Home / Self Care | Payer: 59 | Source: Ambulatory Visit | Attending: Surgery | Admitting: Surgery

## 2012-02-05 DIAGNOSIS — C50919 Malignant neoplasm of unspecified site of unspecified female breast: Secondary | ICD-10-CM

## 2012-02-05 DIAGNOSIS — Z803 Family history of malignant neoplasm of breast: Secondary | ICD-10-CM | POA: Insufficient documentation

## 2012-02-05 DIAGNOSIS — K219 Gastro-esophageal reflux disease without esophagitis: Secondary | ICD-10-CM | POA: Insufficient documentation

## 2012-02-05 DIAGNOSIS — Z853 Personal history of malignant neoplasm of breast: Secondary | ICD-10-CM | POA: Insufficient documentation

## 2012-02-05 DIAGNOSIS — C773 Secondary and unspecified malignant neoplasm of axilla and upper limb lymph nodes: Secondary | ICD-10-CM | POA: Insufficient documentation

## 2012-02-05 HISTORY — PX: BREAST LUMPECTOMY W/ NEEDLE LOCALIZATION: SHX1266

## 2012-02-05 HISTORY — DX: Other specified postprocedural states: Z98.890

## 2012-02-05 HISTORY — DX: Nausea with vomiting, unspecified: R11.2

## 2012-02-05 HISTORY — DX: Adverse effect of unspecified anesthetic, initial encounter: T41.45XA

## 2012-02-05 HISTORY — DX: Other complications of anesthesia, initial encounter: T88.59XA

## 2012-02-05 LAB — POCT HEMOGLOBIN-HEMACUE: Hemoglobin: 13.7 g/dL (ref 12.0–15.0)

## 2012-02-05 SURGERY — BREAST LUMPECTOMY WITH NEEDLE LOCALIZATION AND AXILLARY SENTINEL LYMPH NODE BX
Anesthesia: General | Site: Breast | Laterality: Right | Wound class: Clean

## 2012-02-05 MED ORDER — ACETAMINOPHEN 10 MG/ML IV SOLN
1000.0000 mg | Freq: Once | INTRAVENOUS | Status: AC
  Administered 2012-02-05: 1000 mg via INTRAVENOUS

## 2012-02-05 MED ORDER — ONDANSETRON HCL 4 MG/2ML IJ SOLN
INTRAMUSCULAR | Status: DC | PRN
  Administered 2012-02-05: 4 mg via INTRAVENOUS

## 2012-02-05 MED ORDER — PROPOFOL 10 MG/ML IV EMUL
INTRAVENOUS | Status: DC | PRN
  Administered 2012-02-05: 200 mg via INTRAVENOUS

## 2012-02-05 MED ORDER — DROPERIDOL 2.5 MG/ML IJ SOLN
INTRAMUSCULAR | Status: DC | PRN
  Administered 2012-02-05: 0.625 mg via INTRAVENOUS

## 2012-02-05 MED ORDER — TECHNETIUM TC 99M SULFUR COLLOID FILTERED
1.0000 | Freq: Once | INTRAVENOUS | Status: AC | PRN
  Administered 2012-02-05: 1 via INTRADERMAL

## 2012-02-05 MED ORDER — LACTATED RINGERS IV SOLN
INTRAVENOUS | Status: DC
  Administered 2012-02-05 (×2): via INTRAVENOUS

## 2012-02-05 MED ORDER — CEFAZOLIN SODIUM 1-5 GM-% IV SOLN
1.0000 g | INTRAVENOUS | Status: AC
  Administered 2012-02-05: 1 g via INTRAVENOUS

## 2012-02-05 MED ORDER — SODIUM CHLORIDE 0.9 % IJ SOLN
INTRAMUSCULAR | Status: DC | PRN
  Administered 2012-02-05: 10:00:00 via INTRAMUSCULAR

## 2012-02-05 MED ORDER — CHLORHEXIDINE GLUCONATE 4 % EX LIQD: 1.0000 "application " | Freq: Once | CUTANEOUS | Status: DC

## 2012-02-05 MED ORDER — MIDAZOLAM HCL 2 MG/2ML IJ SOLN
0.5000 mg | INTRAMUSCULAR | Status: DC | PRN
  Administered 2012-02-05 (×2): 2 mg via INTRAVENOUS

## 2012-02-05 MED ORDER — HYDROMORPHONE HCL PF 1 MG/ML IJ SOLN
0.2500 mg | INTRAMUSCULAR | Status: DC | PRN
  Administered 2012-02-05 (×2): 0.5 mg via INTRAVENOUS

## 2012-02-05 MED ORDER — BUPIVACAINE HCL (PF) 0.25 % IJ SOLN
INTRAMUSCULAR | Status: DC | PRN
  Administered 2012-02-05: 30 mL

## 2012-02-05 MED ORDER — MIDAZOLAM HCL 5 MG/5ML IJ SOLN
INTRAMUSCULAR | Status: DC | PRN
  Administered 2012-02-05: 2 mg via INTRAVENOUS

## 2012-02-05 MED ORDER — HYDROCODONE-ACETAMINOPHEN 5-325 MG PO TABS: 1.0000 | ORAL_TABLET | Freq: Four times a day (QID) | ORAL | Status: AC | PRN

## 2012-02-05 MED ORDER — FENTANYL CITRATE 0.05 MG/ML IJ SOLN
50.0000 ug | INTRAMUSCULAR | Status: DC | PRN
  Administered 2012-02-05: 100 ug via INTRAVENOUS

## 2012-02-05 MED ORDER — FENTANYL CITRATE 0.05 MG/ML IJ SOLN
INTRAMUSCULAR | Status: DC | PRN
  Administered 2012-02-05 (×4): 25 ug via INTRAVENOUS

## 2012-02-05 MED ORDER — LIDOCAINE HCL (CARDIAC) 20 MG/ML IV SOLN
INTRAVENOUS | Status: DC | PRN
  Administered 2012-02-05: 100 mg via INTRAVENOUS

## 2012-02-05 MED ORDER — METOCLOPRAMIDE HCL 5 MG/ML IJ SOLN: 10.0000 mg | Freq: Once | INTRAMUSCULAR | Status: DC | PRN

## 2012-02-05 MED ORDER — MIDAZOLAM HCL 2 MG/2ML IJ SOLN: 4.0000 mg | INTRAMUSCULAR | Status: DC | PRN

## 2012-02-05 MED ORDER — DEXAMETHASONE SODIUM PHOSPHATE 4 MG/ML IJ SOLN
INTRAMUSCULAR | Status: DC | PRN
  Administered 2012-02-05: 10 mg via INTRAVENOUS

## 2012-02-05 SURGICAL SUPPLY — 65 items
ADH SKN CLS APL DERMABOND .7 (GAUZE/BANDAGES/DRESSINGS) ×1
APPLIER CLIP 11 MED OPEN (CLIP)
BANDAGE ELASTIC 6 VELCRO ST LF (GAUZE/BANDAGES/DRESSINGS) ×2 IMPLANT
BINDER BREAST LRG (GAUZE/BANDAGES/DRESSINGS) ×2 IMPLANT
BINDER BREAST MEDIUM (GAUZE/BANDAGES/DRESSINGS) IMPLANT
BINDER BREAST XLRG (GAUZE/BANDAGES/DRESSINGS) IMPLANT
BINDER BREAST XXLRG (GAUZE/BANDAGES/DRESSINGS) IMPLANT
BLADE HEX COATED 2.75 (ELECTRODE) ×2 IMPLANT
BLADE SURG 10 STRL SS (BLADE) ×2 IMPLANT
BLADE SURG 15 STRL LF DISP TIS (BLADE) ×1 IMPLANT
BLADE SURG 15 STRL SS (BLADE) ×1
CANISTER SUCTION 1200CC (MISCELLANEOUS) ×2 IMPLANT
CHLORAPREP W/TINT 26ML (MISCELLANEOUS) ×2 IMPLANT
CLIP APPLIE 11 MED OPEN (CLIP) IMPLANT
CLIP TI WIDE RED SMALL 6 (CLIP) ×4 IMPLANT
CLOTH BEACON ORANGE TIMEOUT ST (SAFETY) ×2 IMPLANT
COVER MAYO STAND STRL (DRAPES) ×2 IMPLANT
COVER PROBE W GEL 5X96 (DRAPES) ×2 IMPLANT
COVER TABLE BACK 60X90 (DRAPES) ×2 IMPLANT
DECANTER SPIKE VIAL GLASS SM (MISCELLANEOUS) IMPLANT
DERMABOND ADVANCED (GAUZE/BANDAGES/DRESSINGS) ×1
DERMABOND ADVANCED .7 DNX12 (GAUZE/BANDAGES/DRESSINGS) ×1 IMPLANT
DEVICE DUBIN W/COMP PLATE 8390 (MISCELLANEOUS) ×2 IMPLANT
DRAIN CHANNEL 19F RND (DRAIN) IMPLANT
DRAIN HEMOVAC 1/8 X 5 (WOUND CARE) IMPLANT
DRAPE LAPAROSCOPIC ABDOMINAL (DRAPES) ×2 IMPLANT
DRAPE UTILITY XL STRL (DRAPES) ×2 IMPLANT
ELECT COATED BLADE 2.86 ST (ELECTRODE) ×2 IMPLANT
ELECT REM PT RETURN 9FT ADLT (ELECTROSURGICAL) ×2
ELECTRODE REM PT RTRN 9FT ADLT (ELECTROSURGICAL) ×1 IMPLANT
EVACUATOR SILICONE 100CC (DRAIN) IMPLANT
GAUZE SPONGE 4X4 12PLY STRL LF (GAUZE/BANDAGES/DRESSINGS) IMPLANT
GAUZE SPONGE 4X4 16PLY XRAY LF (GAUZE/BANDAGES/DRESSINGS) IMPLANT
GLOVE BIOGEL PI IND STRL 7.0 (GLOVE) ×1 IMPLANT
GLOVE BIOGEL PI INDICATOR 7.0 (GLOVE) ×1
GLOVE ECLIPSE 6.5 STRL STRAW (GLOVE) ×2 IMPLANT
GLOVE SURG SIGNA 7.5 PF LTX (GLOVE) ×4 IMPLANT
GOWN PREVENTION PLUS XLARGE (GOWN DISPOSABLE) IMPLANT
GOWN PREVENTION PLUS XXLARGE (GOWN DISPOSABLE) ×4 IMPLANT
KIT MARKER MARGIN INK (KITS) ×2 IMPLANT
NDL SAFETY ECLIPSE 18X1.5 (NEEDLE) ×1 IMPLANT
NEEDLE HYPO 18GX1.5 SHARP (NEEDLE) ×2
NEEDLE HYPO 25X1 1.5 SAFETY (NEEDLE) ×4 IMPLANT
NS IRRIG 1000ML POUR BTL (IV SOLUTION) ×2 IMPLANT
PACK BASIN DAY SURGERY FS (CUSTOM PROCEDURE TRAY) ×2 IMPLANT
PAD ALCOHOL SWAB (MISCELLANEOUS) ×2 IMPLANT
PENCIL BUTTON HOLSTER BLD 10FT (ELECTRODE) ×2 IMPLANT
PIN SAFETY STERILE (MISCELLANEOUS) IMPLANT
SHEET MEDIUM DRAPE 40X70 STRL (DRAPES) ×2 IMPLANT
SLEEVE SCD COMPRESS KNEE MED (MISCELLANEOUS) ×2 IMPLANT
SPONGE GAUZE 4X4 12PLY (GAUZE/BANDAGES/DRESSINGS) ×2 IMPLANT
SPONGE LAP 18X18 X RAY DECT (DISPOSABLE) ×2 IMPLANT
STAPLER VISISTAT 35W (STAPLE) IMPLANT
SUT ETHILON 2 0 FS 18 (SUTURE) IMPLANT
SUT MON AB 5-0 PS2 18 (SUTURE) ×2 IMPLANT
SUT SILK 3 0 TIES 17X18 (SUTURE)
SUT SILK 3-0 18XBRD TIE BLK (SUTURE) IMPLANT
SUT VICRYL 3-0 CR8 SH (SUTURE) ×2 IMPLANT
SYR CONTROL 10ML LL (SYRINGE) ×4 IMPLANT
TAPE CLOTH SURG 6X10 WHT LF (GAUZE/BANDAGES/DRESSINGS) ×2 IMPLANT
TOWEL OR 17X24 6PK STRL BLUE (TOWEL DISPOSABLE) ×2 IMPLANT
TOWEL OR NON WOVEN STRL DISP B (DISPOSABLE) ×2 IMPLANT
TUBE CONNECTING 20X1/4 (TUBING) ×2 IMPLANT
WATER STERILE IRR 1000ML POUR (IV SOLUTION) IMPLANT
YANKAUER SUCT BULB TIP NO VENT (SUCTIONS) ×2 IMPLANT

## 2012-02-05 NOTE — H&P (View-Only) (Signed)
Re:   Christina Blackwell DOB:   [birth year] MRN:   [id number]  ASSESSMENT AND PLAN: 1.  Right breast cancer.  Lobular.  About 7 o'clock position.  ER - 85%.  PR - 98%. Ki67 - 9%.  Her2Neu - neg.  Saw Dr. Patrice Paradise.  This area measures 3.9 cm by MRI.  She'll need a generous lumpectomy or a mastectomy. This makes it a T2 tumor.  I discussed the options for breast cancer treatment with the patient.  I discussed the idea of a multidisciplinary approach to the treatment of breast cancer, which often includes medical oncology and radiation oncology.  I discussed the surgical options of lumpectomy vs. mastectomy.  I discussed the options of lymph node biopsy.  The treatment plan depends on the pathologic staging of the tumor and the patient's personal wishes.  The risks of surgery include, but are not limited to, bleeding, infection, the need for further surgery, and nerve injury.  The patient has been given literature on the treatment of breast cancer.  I talked about the genetic counseling/testing.  Though large on MRI, I think she is a candidate for lumpectomy (needle loc) and we will set this up.  2.  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 Had cyclophosphamide and doxorubicin.  Radiation by Dr. Reginal Lutes. 3.  Has subpectoral implants.  By Dr. Rexene Edison. Holderness. 4.  She had a lot of pain with the nuc med injection.  I gave her some EMLA cream    Chief Complaint  Patient presents with  . Breast Cancer    eval rt breast cancer   REFERRING PHYSICIAN:   Dr. Reece Agar. Magrinat  HISTORY OF PRESENT ILLNESS: Christina Blackwell is a 46 y.o. (DOB: [birth year])  white female whose primary care physician is  Daphane Shepherd, [place], [facility], and comes to me today for new right sided breast cancer. This new right breast cancer was found on a screening mammogram.  She could not feel anything.  She has no concerns of her left breast.   Her right breast biopsy showed Right breast cancer,  Lobular.  ER - 85%.  PR - 98%. Ki67 - 9%.  Her2Neu - neg.  Uses Mirena implant. Mother had breast cancer at age 87. (about 3 years ago) Father's sister had breast cancer in her 95's.  First cousin had breast cancer in her 41's.  She went through BRCA testing, which was negative.    Past Medical History  Diagnosis Date  . Breast cancer 2002    left   . Breast cancer 2012    right  . History of bilateral breast implants   . GERD (gastroesophageal reflux disease)       Past Surgical History  Procedure Date  . Portacath placement   . Portacath removal   . Tonsillectomy and adenoidectomy   . Breast lumpectomy 11/2001    left  . Sentinel lymph node biopsy 11/2001    left axillary  . Breast enhancement surgery 2003    bilateral      Current Outpatient Prescriptions  Medication Sig Dispense Refill  . LORazepam (ATIVAN) 0.5 MG tablet Take 0.5 mg by mouth as needed.      . zolpidem (AMBIEN) 5 MG tablet Take 5 mg by mouth at bedtime as needed.         No Known Allergies  REVIEW OF SYSTEMS: Skin:  No history of rash.  No history of abnormal moles. Infection:  No  history of hepatitis or HIV.  No history of MRSA. Neurologic:  No history of stroke.  No history of seizure.  No history of headaches. Cardiac:  No history of hypertension. No history of heart disease.  No history of prior cardiac catheterization.  No history of seeing a cardiologist. Pulmonary:  Does not smoke cigarettes.  No asthma or bronchitis.  No OSA/CPAP.  Endocrine:  No diabetes. No thyroid disease. Gastrointestinal:  No history of stomach disease.  No history of liver disease.  No history of gall bladder disease.  No history of pancreas disease.  No history of colon disease. Urologic:  Kindey stones at age 69. Musculoskeletal:  No history of joint or back disease. Hematologic:  No bleeding disorder.  No history of anemia.  Not anticoagulated. Psycho-social:  The patient is oriented.   The patient has no obvious  psychologic or social impairment to understanding our conversation and plan.  SOCIAL and FAMILY HISTORY: Divorced.  No children. Works for [employer].  PHYSICAL EXAM: BP 124/82  Pulse 80  Temp(Src) 98.3 F (36.8 C) (Temporal)  Resp 16  Ht 5\' 5"  (1.651 m)  Wt 165 lb 6.4 oz (75.025 kg)  BMI 27.52 kg/m2  General: WN WF who is alert and generally healthy appearing.  HEENT: Normal. Pupils equal. Good dentition. Neck: Supple. No mass.  No thyroid mass.  Carotid pulse okay with no bruit. Lymph Nodes:  No supraclavicular or cervical nodes. Lungs: Clear to auscultation and symmetric breath sounds. Heart:  RRR. No murmur or rub. Breasts:  Right:  Bruise at  6 o'clock and tender.  But no mass.  Left:  No mass or nipple discharge.  Abdomen: Soft. No mass. No tenderness. No hernia. Normal bowel sounds.  No abdominal scars. Rectal: Not done. Extremities:  Good strength and ROM  in upper and lower extremities. Neurologic:  Grossly intact to motor and sensory function. Psychiatric: Has normal mood and affect. Behavior is normal.   DATA REVIEWED: Path and x-rays.  Ovidio Kin, MD,  [facility] [address] [place]  [address]   [place], [place]    [address] Phone:  [phone] FAX:  [phone]

## 2012-02-05 NOTE — Anesthesia Postprocedure Evaluation (Signed)
Anesthesia Post Note  Patient: Christina Blackwell  Procedure(s) Performed: Procedure(s) (LRB): BREAST LUMPECTOMY WITH NEEDLE LOCALIZATION AND AXILLARY SENTINEL LYMPH NODE BX (Right)  Anesthesia type: General  Patient location: PACU  Post pain: Pain level controlled  Post assessment: Patient's Cardiovascular Status Stable  Last Vitals:  Filed Vitals:   02/05/12 1330  BP: 119/70  Pulse: 76  Temp: 36.8 C  Resp: 16    Post vital signs: Reviewed and stable  Level of consciousness: alert  Complications: No apparent anesthesia complications

## 2012-02-05 NOTE — Progress Notes (Signed)
Emotional support during breast injections °

## 2012-02-05 NOTE — Progress Notes (Signed)
Spoke to Dr Gelene Mink about patient's anxiety, ok to give up to 4 mg Versed for injections

## 2012-02-05 NOTE — Brief Op Note (Signed)
02/05/2012  11:45 AM  PATIENT:  Christina Blackwell, 46 y.o., female, MRN: [id number]  PREOP DIAGNOSIS:  right breast cancer  POSTOP DIAGNOSIS:   Right breast cancer, 7 o'clock position (T2, N0)  PROCEDURE:   Procedure(s):  Right BREAST LUMPECTOMY WITH NEEDLE LOCALIZATION AND right AXILLARY SENTINEL LYMPH NODE BX  SURGEON:   Ovidio Kin, M.D.  ASSISTANT:   NOne  ANESTHESIA:   general  April Carter, CRNA - CRNA Constance Goltz, MD - Anesthesiologist  General  EBL:  <75 cc  BLOOD ADMINISTERED: none  DRAINS: none   LOCAL MEDICATIONS USED:   30 cc 1/4% marcaine  SPECIMEN:   Right breast lumpectomy and right axillary sentinel lymph node  COUNTS CORRECT:  YES  INDICATIONS FOR PROCEDURE:  Christina Blackwell is a 46 y.o. (DOB: 12/21/1965) white female whose primary care physician is Provider Not In System and comes for right breast lumpectomy and right axillary SLNBx.   The indications and risks of the surgery were explained to the patient.  The risks include, but are not limited to, infection, bleeding, and nerve injury.  Note dictated to:   #[id number]

## 2012-02-05 NOTE — Anesthesia Preprocedure Evaluation (Signed)
Anesthesia Evaluation  Patient identified by MRN, date of birth, ID band Patient awake    Reviewed: Allergy & Precautions, H&P , NPO status , Patient's Chart, lab work & pertinent test results, reviewed documented beta blocker date and time   History of Anesthesia Complications (+) PONV  Airway Mallampati: II TM Distance: >3 FB Neck ROM: full    Dental   Pulmonary neg pulmonary ROS,          Cardiovascular neg cardio ROS     Neuro/Psych Negative Neurological ROS  Negative Psych ROS   GI/Hepatic negative GI ROS, Neg liver ROS, GERD-  Medicated and Controlled,  Endo/Other  Negative Endocrine ROS  Renal/GU negative Renal ROS  Genitourinary negative   Musculoskeletal   Abdominal   Peds  Hematology negative hematology ROS (+)   Anesthesia Other Findings See surgeon's H&P   Reproductive/Obstetrics negative OB ROS                           Anesthesia Physical Anesthesia Plan  ASA: II  Anesthesia Plan: General   Post-op Pain Management:    Induction: Intravenous  Airway Management Planned: LMA  Additional Equipment:   Intra-op Plan:   Post-operative Plan: Extubation in OR  Informed Consent: I have reviewed the patients History and Physical, chart, labs and discussed the procedure including the risks, benefits and alternatives for the proposed anesthesia with the patient or authorized representative who has indicated his/her understanding and acceptance.     Plan Discussed with: CRNA and Surgeon  Anesthesia Plan Comments:         Anesthesia Quick Evaluation

## 2012-02-05 NOTE — Transfer of Care (Signed)
Immediate Anesthesia Transfer of Care Note  Patient: Christina Blackwell  Procedure(s) Performed: Procedure(s) (LRB): BREAST LUMPECTOMY WITH NEEDLE LOCALIZATION AND AXILLARY SENTINEL LYMPH NODE BX (Right)  Patient Location: PACU  Anesthesia Type: General  Level of Consciousness: awake and alert   Airway & Oxygen Therapy: Patient Spontanous Breathing and Patient connected to face mask oxygen  Post-op Assessment: Report given to PACU RN and Post -op Vital signs reviewed and stable  Post vital signs: Reviewed and stable  Complications: No apparent anesthesia complications

## 2012-02-05 NOTE — Discharge Instructions (Signed)
DISCHARGE INSTRUCTIONS TO PATIENT  Activity:  Driving - may drive in one to two days if doing wel   Lifting - no heavy lifting for one week (<15 pounds)  Wound Care:   Leave bandage x 2 days, then may remove and shower.  Diet:  Regular  Follow up appointment:  Call Dr. Allene Pyo office [facility]) at [phone] for an appointment in 5 to 7 days  Medications and dosages:  Resume your home medications.  You have a prescription for:  Vicodin  Call Dr. Ezzard Standing or his office  [phone]) if you have:  Temperature greater than 100.4,  Persistent nausea and vomiting,  Severe uncontrolled pain,  Redness, tenderness, or signs of infection (pain, swelling, redness, odor or green/yellow discharge around the site),  Difficulty breathing, headache or visual disturbances,  Any other questions or concerns you may have after discharge.  In an emergency, call 911 or go to an Emergency Department at a nearby hospital.   [facility]  [address] [place], [place] [address] [phone]   Post Anesthesia Home Care Instructions  Activity: Get plenty of rest for the remainder of the day. A responsible adult should stay with you for 24 hours following the procedure.  For the next 24 hours, DO NOT: -Drive a car -Advertising copywriter -Drink alcoholic beverages -Take any medication unless instructed by your physician -Make any legal decisions or sign important papers.  Meals: Start with liquid foods such as gelatin or soup. Progress to regular foods as tolerated. Avoid greasy, spicy, heavy foods. If nausea and/or vomiting occur, drink only clear liquids until the nausea and/or vomiting subsides. Call your physician if vomiting continues.  Special Instructions/Symptoms: Your throat may feel dry or sore from the anesthesia or the breathing tube placed in your throat during surgery. If this causes discomfort, gargle with warm salt water. The discomfort should  disappear within 24 hours.

## 2012-02-05 NOTE — Anesthesia Procedure Notes (Signed)
Procedure Name: LMA Insertion Date/Time: 02/05/2012 10:07 AM Performed by: Gladys Damme Pre-anesthesia Checklist: Patient identified, Emergency Drugs available, Suction available and Patient being monitored Patient Re-evaluated:Patient Re-evaluated prior to inductionOxygen Delivery Method: Circle system utilized Preoxygenation: Pre-oxygenation with 100% oxygen Intubation Type: IV induction Ventilation: Mask ventilation without difficulty LMA: LMA inserted LMA Size: 4.0 Number of attempts: 1 Placement Confirmation: breath sounds checked- equal and bilateral and positive ETCO2 Tube secured with: Tape Dental Injury: Teeth and Oropharynx as per pre-operative assessment

## 2012-02-05 NOTE — Interval H&P Note (Signed)
History and Physical Interval Note:  02/05/2012 9:54 AM  Christina Blackwell  has presented today for surgery, with the diagnosis of right breast cancer  The various methods of treatment have been discussed with the patient and family.  Friend, Babette Relic, at the bedside.  After consideration of risks, benefits and other options for treatment, the patient has consented to  Procedure(s) (LRB): right BREAST LUMPECTOMY WITH NEEDLE LOCALIZATION AND right AXILLARY SENTINEL LYMPH NODE BX (Right) as a surgical intervention .    The patients' history has been reviewed, patient examined, no change in status, stable for surgery.  I have reviewed the patients' chart and labs.  Questions were answered to the patient's satisfaction.     Moishe Schellenberg H

## 2012-02-06 NOTE — Op Note (Signed)
NAME:  LEILAH, POLIMENI                  ACCOUNT NO.:  MEDICAL RECORD NO.:  [id number]  LOCATION:                                 FACILITY:  PHYSICIAN:  Sandria Bales. Ezzard Standing, M.D.       DATE OF BIRTH:  DATE OF PROCEDURE:  02/05/2012                              OPERATIVE REPORT   PREOPERATIVE DIAGNOSIS:  Right breast cancer, 3.9 cm by MRI (T2, N0).  POSTOPERATIVE DIAGNOSIS:  Right breast cancer at 7 o'clock position, 3.9 cm by MRI (T2, N0 tumor).  PROCEDURE:  Injection of right breast with methylene blue, right axillary sentinel lymph node biopsy, and right breast lumpectomy using needle localization.  OPERATIVE SURGEON:  Sandria Bales. Ezzard Standing, M.D.  FIRST ASSISTANT:  None.  ANESTHESIA:  General LMA supervised by Dr. Hart Robinsons.  COMPLICATIONS:  None.  BLOOD LOSS:  Less than 75 mL.  INDICATION FOR PROCEDURE:  Ms. Buske is a 46 year old white female whose primary care doctor is Daphane Shepherd, [place] at [facility] in [place]. She was found to have an abnormality in right breast, underwent a biopsy, which revealed a lobular carcinoma of the right breast.  This measured on MRI about 3.9 cm.  It is a T2 tumor.  Discussion was carried out about proceeding with lumpectomy versus mastectomy.  She preferred to try a lumpectomy.    The indications, potential complications of surgery explained to the patient.  Potential complications include, but are not limited to, bleeding, infection, nerve injury, and need for further surgery.  OPERATIVE NOTE:  The patient placed in a supine position in room number 8 at [facility].  She underwent general anesthesia supervised by Dr. Hart Robinsons.  She was given Ancef  1 g at initiation of procedure.  Her right breast was prepped with ChloraPrep and sterilely draped.  A time-out was held and surgical checklist run.  She had a wire coming from her right breast at about the 7 o'clock Position that localized the tumor.  In the preoperative  area, she had been injected with 1 millicuries of technetium sulfur colloid and immediately preop, I injected her with about 1.5 mL of 40% methylene blue into her right breast.  I started first at the sentinel lymph node.  I found a lymph node in the right axilla that had counts of about 300 with a background of between 5 and 10.  The node was also slightly blue.  This was sent for pathologic evaluation, and it is pending at the time of this dictation.  I controlled bleeding with Bovie electrocautery.  I closed the subcutaneous tissues with 3-0 Vicryl suture, the skin with a 5-0 Monocryl suture.  I then turned my attention to the breast.  The clip lay in the middle of a broad abnormality, so I tried to make a generous lumpectomy.  I followed the guidewire down, excised a block of breast tissue approximately 8 cm x 10 cm.  I went down the chest wall and the pectoralis major muscle.  I did not expose or see her subpectoral implant.  I then painted the specimen with 6 color paint kit and did a specimen mammogram, which confirmed the  clip and the wire were in the middle of the specimen.  I then irrigated the wound, I used approximately 30 mL of 0.25% Marcaine as a local anesthetic, to the main breast biopsy cavity in the axilla. I used small clips to mark the outer borders of the biopsy cavity at 3, 6, 9, and 12 o'clock position.  I put 2 clips on the chest wall.  I then irrigated the wound with about 500 mL of saline.  I closed this wound with interrupted 3-0 Vicryl sutures.  I closed both wounds with 5-0 Monocryl suture, painted wounds with Dermabond and sterilely dressed with 4x4s and a breast binder.  The patient be discharged home today.  He will return to see me in 1 week for followup.  DISCHARGE CONDITION:  Good.  COUNTS:  Final sponge and needle count were correct.   Sandria Bales. Ezzard Standing, M.D., FACS   DHN/MEDQ  D:  02/05/2012  T:  02/05/2012  Job:  [id number]  cc:   Lowella Dell, M.D. Oneita Hurt, M.D. Daphane Shepherd, [place]

## 2012-02-10 ENCOUNTER — Encounter (INDEPENDENT_AMBULATORY_CARE_PROVIDER_SITE_OTHER): Payer: Self-pay

## 2012-02-12 ENCOUNTER — Encounter (INDEPENDENT_AMBULATORY_CARE_PROVIDER_SITE_OTHER): Payer: Self-pay | Admitting: Surgery

## 2012-02-12 ENCOUNTER — Other Ambulatory Visit (INDEPENDENT_AMBULATORY_CARE_PROVIDER_SITE_OTHER): Payer: Self-pay | Admitting: Surgery

## 2012-02-12 ENCOUNTER — Ambulatory Visit (INDEPENDENT_AMBULATORY_CARE_PROVIDER_SITE_OTHER): Payer: 59 | Admitting: Surgery

## 2012-02-12 VITALS — BP 100/70 | HR 72 | Temp 98.7°F | Resp 12 | Ht 65.0 in | Wt 165.4 lb

## 2012-02-12 DIAGNOSIS — Z853 Personal history of malignant neoplasm of breast: Secondary | ICD-10-CM

## 2012-02-12 DIAGNOSIS — C50412 Malignant neoplasm of upper-outer quadrant of left female breast: Secondary | ICD-10-CM | POA: Insufficient documentation

## 2012-02-12 NOTE — Progress Notes (Addendum)
Re:   Christina Blackwell DOB:   [birth year] MRN:   [id number]  ASSESSMENT AND PLAN: 1.  Right breast cancer.  Lobular.  About 7 o'clock position.  ER - 85%.  PR - 98%. Ki67 - 9%.  Her2Neu - neg.  Saw Dr. Patrice Paradise.  Path reveals 4.3 cm invasive lobular ca with positive lateral margins.  And 2/2 nodes positive.  Dr. Bevelyn Buckles is out of town for a week.  Will discuss with Dr. Reginal Lutes.    She needs wider excision or mastectomy, possible axillary node dissection, probable porta cath.  She wants to try a wider excision of the lumpectomy cavity.  The patient had a terrible experience with a porta cath during her last chemo and she is adamant about not having a porta cath.  I explained the findings with the patient and the options going forward. [BRCA 1/2 neg. 01/26/2012.  DN  02/18/2012] Maeda has seen Drs. Manning and Holderness.  She is to see Dr. Darnelle Catalan this Friday.  She wants to go ahead with a wide excision of her right breast and right axillary node dissection. This is scheduled for 03/02/2012.  I talked to her on the phone.  DN 02/23/2012] 2.  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 Had cyclophosphamide and doxorubicin.  Radiation by Dr. Reginal Lutes. 3.  Has subpectoral implants.  By Dr. Rexene Edison. Holderness. 4.  She had a lot of pain with the nuc med injection.  I gave her some EMLA cream    Chief Complaint  Patient presents with  . Routine Post Op   REFERRING PHYSICIAN:   Dr. Reece Agar. Magrinat  HISTORY OF PRESENT ILLNESS: Christina Blackwell is a 46 y.o. (DOB: November 26, 1966)  white female whose primary care physician is  Daphane Shepherd, [place], [facility], and post op right breast lumpectomy and right axillary SLNBx.  Unfortunately, her final path report showed postitive lateral margins and 2/2 positive nodes.   Her right breast biopsy showed Right breast cancer, Lobular.  ER - 85%.  PR - 98%. Ki67 - 9%.  Her2Neu - neg.  Uses Mirena implant. Mother had breast cancer at age  64. (about 3 years ago) Father's sister had breast cancer in her 34's.  First cousin had breast cancer in her 34's.  She went through BRCA testing, which was negative.    Past Medical History  Diagnosis Date  . History of bilateral breast implants   . GERD (gastroesophageal reflux disease)   . Breast cancer 2002    left   . Breast cancer 2012    right  . Complication of anesthesia   . PONV (postoperative nausea and vomiting)       Past Surgical History  Procedure Date  . Portacath placement   . Portacath removal   . Tonsillectomy and adenoidectomy   . Breast lumpectomy 11/2001    left  . Sentinel lymph node biopsy 11/2001    left axillary  . Breast enhancement surgery 2003    bilateral  . Breast lumpectomy w/ needle localization 02/05/2012    right/ slnbx      Current Outpatient Prescriptions  Medication Sig Dispense Refill  . HYDROcodone-acetaminophen (NORCO) 5-325 MG per tablet Take 1-2 tablets by mouth every 6 (six) hours as needed for pain.  30 tablet  1  . LORazepam (ATIVAN) 0.5 MG tablet Take 0.5 mg by mouth as needed.      Marland Kitchen omeprazole (PRILOSEC) 20 MG capsule Take 20 mg  by mouth daily.      Marland Kitchen zolpidem (AMBIEN) 5 MG tablet Take 5 mg by mouth at bedtime as needed.         No Known Allergies  REVIEW OF SYSTEMS: Urologic:  Kindey stones at age 59.  SOCIAL and FAMILY HISTORY: Divorced.  No children. Works for [employer].  PHYSICAL EXAM: BP 100/70  Pulse 72  Temp(Src) 98.7 F (37.1 C) (Temporal)  Resp 12  Ht 5\' 5"  (1.651 m)  Wt 165 lb 6 oz (75.014 kg)  BMI 27.52 kg/m2  General: WN WF who is alert and generally healthy appearing.  Breasts:  Right:  Thought tender, LOQ incision looks good.  Right axillary incision okay.  Left:  No mass or nipple discharge.  DATA REVIEWED: Path to patient.  Ovidio Kin, MD,  [facility] [address] [place]  [address]   [place], [place]    [address] Phone:  [phone] FAX:   [phone]

## 2012-02-13 ENCOUNTER — Telehealth: Payer: Self-pay | Admitting: Oncology

## 2012-02-13 NOTE — Telephone Encounter (Signed)
Met with Dr Ezzard Standing and she requests telephone call from or appointment with Dr Darnelle Catalan sooner than 02/27/12. She  is confused about needing second surgery and needs clarification. I told her I would pass this information on to Dr Darnelle Catalan and [facility] for follow up next week. Christina Blackwell call back number is [phone]

## 2012-02-16 ENCOUNTER — Other Ambulatory Visit: Payer: Self-pay | Admitting: *Deleted

## 2012-02-16 ENCOUNTER — Telehealth (INDEPENDENT_AMBULATORY_CARE_PROVIDER_SITE_OTHER): Payer: Self-pay

## 2012-02-16 ENCOUNTER — Telehealth (INDEPENDENT_AMBULATORY_CARE_PROVIDER_SITE_OTHER): Payer: Self-pay | Admitting: Surgery

## 2012-02-16 NOTE — Telephone Encounter (Signed)
This RN spoke with pt per her call stating " I feel like I am going crazy "  Above statement is relating to " I saw Dr Ezzard Standing and he says the 1st surgery didn't get all the cancer so now I need a second surgery and they want to remove all my lymph nodes"  This RN reviewed Dr Allene Pyo note and discussed with pt her concerns including anxiety of having to have additional therapy. Christina Blackwell also had question regarding " will I need radiation and chemo again " " and I am needing to know about when I could return to work"  Note pt's statement of " crazy  " was verbally clarified as anxiety relating to additional surgery and possible chemotherapy.  Pt has in the home clonazepam ordered recently by Dr Jilda Roche which pt will use if anxiety increases and interferes with ADL's.  Per this conversation pt was able to state she would prefer to do a mastectomy if Dr Ezzard Standing had to remove a large part of her breast and  if it meant she would not need radiation.  Pt will follow up with Dr Allene Pyo office.  Christina Blackwell is established with Dr Stephens November and will call his office to inquire about an apt ASAP to discuss reconstruction choices if she was to proceed to a mastectomy.  No other questions at present - pt understands to call if needed.  This RN called and spoke with [name] with Dr Ezzard Standing per above.

## 2012-02-16 NOTE — Telephone Encounter (Signed)
I called the patient to answer a question she left on my voicemail.  I advised regarding postop fluid collection after lymphnode removal that the body usually absorbs it.  Sometimes if it gets tender or red the dr may need to use a needle and draw fluid off.  The patient got a little tearful this am saying she is overwhelmed and has more questions.  She is having second thoughts about whether she should have a mastectomy.  She is feeling like she is not important to [place] because we are too busy and the process is too drawn out.  She wanted to get in sooner with the oncologist dr Darnelle Catalan..  I told her I would have Dr Ezzard Standing call her and I left a message for Kaylyn Lim to get her appointment moved up and speak with the pt.

## 2012-02-16 NOTE — Telephone Encounter (Signed)
She spoke to the patient and the pt feels a mastectomy with reconstruction may be best.  She still needs to meet with the plastic surgeon.  The pt would like to know how much tissue will be removed at surgery.  I told Christina Blackwell that I will have Dr Ezzard Standing call the pt.

## 2012-02-17 ENCOUNTER — Other Ambulatory Visit (INDEPENDENT_AMBULATORY_CARE_PROVIDER_SITE_OTHER): Payer: Self-pay | Admitting: Surgery

## 2012-02-17 ENCOUNTER — Telehealth (INDEPENDENT_AMBULATORY_CARE_PROVIDER_SITE_OTHER): Payer: Self-pay

## 2012-02-17 DIAGNOSIS — C50919 Malignant neoplasm of unspecified site of unspecified female breast: Secondary | ICD-10-CM

## 2012-02-17 NOTE — Telephone Encounter (Signed)
Dr Ezzard Standing spoke with the patient

## 2012-02-17 NOTE — Telephone Encounter (Signed)
Message copied by Ivory Broad on Tue Feb 17, 2012  9:18 AM ------      Message from: Ezzard Standing, DAVID H      Created: Mon Feb 16, 2012  6:29 PM      Regarding: Obtain consults with Dr. Kathrynn Running and Dr. Stephens November.       [name],            I talked to Shafer.  She is undecided about what to do.  I do want to talk to Magrinat before scheduling her.            In the meantime, if you could make an appointments with Dr. Kathrynn Running and Dr. Stephens November to talk to her.  She has not seen either in years.            Remind me to call Dr. Stephens November tomorrow so he knows she is coming.            Thanks,      Onalee Hua

## 2012-02-17 NOTE — Telephone Encounter (Signed)
I called the patient and instructed her that I made a referral to Dr Kathrynn Running and the cancer center will call her.  I scheduled her to see Dr Stephens November 3/6 at 1pm.

## 2012-02-18 ENCOUNTER — Encounter: Payer: Self-pay | Admitting: *Deleted

## 2012-02-18 NOTE — Progress Notes (Signed)
Pt reports extreme lethargy. Has has a lumpectomy 2 weeks ago. Not under current tx. Pt was advised to call her surgeon and reassured that it may take longer than  2 weeks to recover from general anesthesia.

## 2012-02-23 ENCOUNTER — Ambulatory Visit
Admission: RE | Admit: 2012-02-23 | Discharge: 2012-02-23 | Disposition: A | Discharge status: Home / Self Care | Payer: 59 | Source: Ambulatory Visit | Attending: Radiation Oncology | Admitting: Radiation Oncology

## 2012-02-23 ENCOUNTER — Encounter: Payer: Self-pay | Admitting: *Deleted

## 2012-02-23 ENCOUNTER — Telehealth: Payer: Self-pay | Admitting: Oncology

## 2012-02-23 ENCOUNTER — Encounter (INDEPENDENT_AMBULATORY_CARE_PROVIDER_SITE_OTHER): Payer: Self-pay

## 2012-02-23 ENCOUNTER — Other Ambulatory Visit (INDEPENDENT_AMBULATORY_CARE_PROVIDER_SITE_OTHER): Payer: Self-pay | Admitting: Surgery

## 2012-02-23 ENCOUNTER — Encounter: Payer: Self-pay | Admitting: Radiation Oncology

## 2012-02-23 ENCOUNTER — Other Ambulatory Visit: Payer: Self-pay | Admitting: Oncology

## 2012-02-23 VITALS — BP 129/90 | HR 76 | Temp 98.1°F | Resp 20 | Ht 65.0 in | Wt 162.7 lb

## 2012-02-23 DIAGNOSIS — Z809 Family history of malignant neoplasm, unspecified: Secondary | ICD-10-CM | POA: Insufficient documentation

## 2012-02-23 DIAGNOSIS — K219 Gastro-esophageal reflux disease without esophagitis: Secondary | ICD-10-CM | POA: Insufficient documentation

## 2012-02-23 DIAGNOSIS — C50919 Malignant neoplasm of unspecified site of unspecified female breast: Secondary | ICD-10-CM

## 2012-02-23 DIAGNOSIS — Z79899 Other long term (current) drug therapy: Secondary | ICD-10-CM | POA: Insufficient documentation

## 2012-02-23 DIAGNOSIS — H409 Unspecified glaucoma: Secondary | ICD-10-CM | POA: Insufficient documentation

## 2012-02-23 DIAGNOSIS — C773 Secondary and unspecified malignant neoplasm of axilla and upper limb lymph nodes: Secondary | ICD-10-CM | POA: Insufficient documentation

## 2012-02-23 HISTORY — DX: Depression, unspecified: F32.A

## 2012-02-23 HISTORY — DX: Major depressive disorder, single episode, unspecified: F32.9

## 2012-02-23 HISTORY — DX: Anxiety disorder, unspecified: F41.9

## 2012-02-23 NOTE — Progress Notes (Signed)
Dr Kathrynn Running suggested some staging studies in this pt given the long delay since her mammogram. I have entered a CT of the chest to be done hopefully before her next visit (this week).  GM

## 2012-02-23 NOTE — Telephone Encounter (Signed)
I called the patient to let her know that a scheduler would be calling to set her up for a CT of the Chest.   Dr. Kathrynn Running contacted Dr. Darnelle Catalan after seeing her today- and Dr. Darnelle Catalan ordered a CT Chest because she has lobular carcinoma with two positive sentinel nodes.   Pt verbalized understanding and agreed with the plan.  She had to go because Dr. Ezzard Standing was calling on the other line.

## 2012-02-23 NOTE — Progress Notes (Signed)
Radiation Oncology         [phone] ________________________________  Initial outpatient Consultation  Name: Christina Blackwell MRN: [id number]  Date: 02/23/2012  DOB: 09/23/1966  [id number] Not In System  Kandis Cocking, MD   REFERRING PHYSICIAN: Kandis Cocking, MD  DIAGNOSIS: The encounter diagnosis was Breast cancer, Lobular. Right. 4.3 cm tumor, 1/1 node. T2, N1.  Lumpectomy 02/05/2012.Marland Kitchen  HISTORY OF PRESENT ILLNESS::Christina Blackwell is a 46 y.o. woman who received radiotherapy to the left breast under my direction in April through June of 2003 when she was 46 years old for stage TIc N0 disease. In routine followup, patient was found to have a new architectural distortion left breast prompting ultrasound-guided biopsy on June 25 which demonstrated invasive mammary carcinoma with lobular features. Breast MRI showed what appeared to be a 3.9 cm primary tumor with irregular spiculated borders seen in the lateral central right breast extending toward the pectoralis muscle. The patient proceeded to image guided lumpectomy with sentinel lymph node dissection under the care of Dr. Ovidio Kin on February 21st. The likely specimen contained a 4.3 Centimeter grade 1 invasive lobular carcinoma broadly involving me in cauterized lateral margin and close to the inked superior margin. 2 sentinel lymph nodes were sampled and both were involved with metastatic disease. She has kindly been referred today to discuss the recent findings and discuss possible breast conservation. She is also due to see Dr. Darnelle Catalan on March 15 prior to returning to the operating room the following Tuesday.  PREVIOUS RADIATION THERAPY: Yes  PAST MEDICAL HISTORY:  has a past medical history of History of bilateral breast implants; GERD (gastroesophageal reflux disease); Complication of anesthesia; PONV (postoperative nausea and vomiting); History of radiation therapy (03/30/02-05/16/02); History of chemotherapy; Breast cancer  (2002); Breast cancer (2012); Anxiety; Depression; and Glaucoma.    PAST SURGICAL HISTORY: Past Surgical History  Procedure Date  . Portacath placement   . Portacath removal   . Tonsillectomy and adenoidectomy   . Breast lumpectomy 11/2001    left  . Sentinel lymph node biopsy 11/2001    left axillary  . Breast enhancement surgery 2003    bilateral  . Breast lumpectomy w/ needle localization 02/05/2012    right/ slnbx  . Mirena iud     FAMILY HISTORY: family history includes Cancer in her cousin, mother, and paternal aunt.  SOCIAL HISTORY:  reports that she has never smoked. She does not have any smokeless tobacco history on file. She reports that she drinks alcohol. She reports that she does not use illicit drugs.  ALLERGIES: Review of patient's allergies indicates no known allergies.  MEDICATIONS:  Current Outpatient Prescriptions  Medication Sig Dispense Refill  . cetirizine (ZYRTEC) 10 MG tablet Take 10 mg by mouth as needed.      . clonazePAM (KLONOPIN) 0.5 MG tablet Take 0.5 mg by mouth 2 (two) times daily as needed. Per patient stated 02/23/12      . ibuprofen (ADVIL,MOTRIN) 200 MG tablet Take 200 mg by mouth every 6 (six) hours as needed.      . ranitidine (ZANTAC) 150 MG tablet Take 150 mg by mouth 2 (two) times daily.      Marland Kitchen zolpidem (AMBIEN) 5 MG tablet Take 5 mg by mouth at bedtime as needed.      Marland Kitchen HYDROcodone-acetaminophen (NORCO) 5-325 MG per tablet Take 1 tablet by mouth every 6 (six) hours as needed.      Marland Kitchen LORazepam (ATIVAN) 0.5 MG tablet Take 0.5  mg by mouth as needed.      Marland Kitchen omeprazole (PRILOSEC) 20 MG capsule Take 20 mg by mouth daily.        REVIEW OF SYSTEMS:  A 15 point review of systems is documented in the electronic medical record. This was obtained by the nursing staff. However, I reviewed this with the patient to discuss relevant findings and make appropriate changes.  Pertinent items are noted in HPI.   PHYSICAL EXAM:  height is 5\' 5"  (1.651 m) and  weight is 162 lb 11.2 oz (73.8 kg). Her oral temperature is 98.1 F (36.7 C). Her blood pressure is 129/90 and her pulse is 76. Her respiration is 20.   The patient is in no acute distress today. She alert and oriented. Her right breast shows a well attended axillary incision which is well-healed with no evidence of infection. The lumpectomy incision appears in a curvilinear orientation the lower outer quadrant with of the right breast with no evidence of infection or drainage. Cosmetically, there is no deformity of the right breast. The arms are free of lymphedema. The extremities are free of cyanosis clubbing or edema. The patient's speech is fluent articulate. Gait is unremarkable. Motor is intact in upper and lower extremity. Her mood is somewhat anxious given the recent findings and she is in particular anxious about the length of time she has known about the abnormality in the right breast prior to completing definitive treatment.  LABORATORY DATA:  Lab Results  Component Value Date   WBC 7.0 02/20/2011   HGB 13.7 02/05/2012   HCT 41.4 02/20/2011   MCV 89.4 02/20/2011   PLT 248 02/20/2011   Lab Results  Component Value Date   NA 137 02/20/2011   NA 137 02/20/2011   K 3.8 02/20/2011   K 3.8 02/20/2011   CL 105 02/20/2011   CL 105 02/20/2011   CO2 24 02/20/2011   CO2 24 02/20/2011   Lab Results  Component Value Date   ALT 20 02/20/2011   ALT 20 02/20/2011   AST 19 02/20/2011   AST 19 02/20/2011   ALKPHOS 50 02/20/2011   ALKPHOS 50 02/20/2011   BILITOT 0.5 02/20/2011   BILITOT 0.5 02/20/2011     RADIOGRAPHY: Nm Sentinel Node Inj-no Rpt (breast)  02/05/2012  CLINICAL DATA: right breast cancer   Sulfur colloid was injected intradermally by the nuclear medicine  technologist for breast cancer sentinel node localization.     Mm Breast Surgical Specimen  02/05/2012  *RADIOLOGY REPORT*  Clinical Data:  46 year old female with newly diagnosed right breast cancer.  Wire localization prior to right lumpectomy.  NEEDLE  LOCALIZATION WITH MAMMOGRAPHIC GUIDANCE AND SPECIMEN RADIOGRAPH  Patient presents for needle localization prior to right lumpectomy. I met with the patient and we discussed the procedure of needle localization including benefits and alternatives. We discussed the high likelihood of a successful procedure. We discussed the risks of the procedure, including infection, bleeding, tissue injury, and further surgery. Informed, written consent was given.  Using mammographic guidance, sterile technique, 2% lidocaine and a 7 cm modified Kopans needle, the biopsy clip and mass were localized using a lateral approach.  The films are marked for Dr. Ezzard Standing.  Specimen radiograph was performed at day surgery, and confirms the biopsy clip and wire present in the tissue sample.  The specimen is marked for pathology.  IMPRESSION: Needle localization right breast.  No apparent complications.  Original Report Authenticated By: Rosendo Gros, M.D.   Mm Breast  Wire Localization Right  02/05/2012  *RADIOLOGY REPORT*  Clinical Data:  46 year old female with newly diagnosed right breast cancer.  Wire localization prior to right lumpectomy.  NEEDLE LOCALIZATION WITH MAMMOGRAPHIC GUIDANCE AND SPECIMEN RADIOGRAPH  Patient presents for needle localization prior to right lumpectomy. I met with the patient and we discussed the procedure of needle localization including benefits and alternatives. We discussed the high likelihood of a successful procedure. We discussed the risks of the procedure, including infection, bleeding, tissue injury, and further surgery. Informed, written consent was given.  Using mammographic guidance, sterile technique, 2% lidocaine and a 7 cm modified Kopans needle, the biopsy clip and mass were localized using a lateral approach.  The films are marked for Dr. Ezzard Standing.  Specimen radiograph was performed at day surgery, and confirms the biopsy clip and wire present in the tissue sample.  The specimen is marked for  pathology.  IMPRESSION: Needle localization right breast.  No apparent complications.  Original Report Authenticated By: Rosendo Gros, M.D.      IMPRESSION: Ms. Kirschbaum is a very nice 46 year old woman with a new node-positive lobular carcinoma the right breast with a history of left-sided breast cancer 10 years ago. Her initial lumpectomy and sentinel lymph node dissection was notable for positive surgical margin in both sentinel lymph nodes were positive for involvement at this point, she would benefit from reexcision with axillary completion dissection versus mastectomy. She has discussed these 2 options with Dr. Ezzard Standing and remains most interested in pursuing breast conservation.  PLAN: Today, talked to the patient about her findings and workup thus far proven talked about the role of radiation treatment management of breast cancer. She is quite familiar with this discussion and remembered the effects related to radiation the short and long-term. She also remembered the details of radiation setup and treatment. She expressed most of her anxiety about the long delay from her initial abnormal mammogram to surgery but understands that the diagnostic workup and multidisciplinary consensus development can be time consuming but least do much more informed decisions.  I talked to her son today about what to expect with upcoming imaging and discussion of systemic therapy as well as local therapy. I explained that at this point she appears to have a possible stage IIB right breast cancer, pending additional surgery. I explained the size the tumor may be adjusted based on her additional finding and the amount of lymph node involvement can also affect a tumor stage up to stage III. She also understands that systemic staging has not occurred. I suspect the patient may benefit from imaging of the chest abdomen and pelvis either preoperatively or postoperatively.   I enjoyed seeing Ms. Dowda again today and will be  happy to follow her progress over the next few weeks. I will look forward to following up on her surgery results to discuss potential radiation treatment options.  I spent 60 minutes minutes face to face with the patient and more than 50% of that time was spent in counseling and/or coordination of care.   ------------------------------------------------  Artist Pais. Kathrynn Running, M.D.

## 2012-02-23 NOTE — Progress Notes (Signed)
Recon Hx Left Breast Cancer treated Radiation 03/30/02-05/16/02 Triple neg. Left IDC, cyclophosphamide ,doxorubicin, Dx 01/09/12 right breast 9 0'clock Subpectoral implants  Right Breast Lumpectomy 02/05/12=Invasive Lobular CA, lymph node,right axilla sentinel bxs=(2/2)positive for Metastatic Ca,ER/PR=positive Her 2Neu-neg.  Allergies:NKDa

## 2012-02-23 NOTE — Progress Notes (Signed)
Please see the Nurse Progress Note in the MD Initial Consult Encounter for this patient. 

## 2012-02-24 ENCOUNTER — Telehealth: Payer: Self-pay | Admitting: Oncology

## 2012-02-24 NOTE — Telephone Encounter (Signed)
The patient called with questions about her plan of care.   The CT of the Chest was scheduled for Thursday and I gave her that information.   I also answered general questions about the process of her appointments, re-excision and what to expect.   Patient expressed understanding.  Gave her my direct number should she have any more questions.

## 2012-02-25 NOTE — Progress Notes (Signed)
Pt here 2/13 Did well Having nodes dissected-no RCC bed requested, but pt told to bring overnight bag and meds just i case she needs to stay

## 2012-02-26 ENCOUNTER — Ambulatory Visit (HOSPITAL_COMMUNITY)
Admission: RE | Admit: 2012-02-26 | Discharge: 2012-02-26 | Disposition: A | Discharge status: Home / Self Care | Payer: 59 | Source: Ambulatory Visit | Attending: Oncology | Admitting: Oncology

## 2012-02-26 DIAGNOSIS — R911 Solitary pulmonary nodule: Secondary | ICD-10-CM | POA: Insufficient documentation

## 2012-02-26 DIAGNOSIS — C50919 Malignant neoplasm of unspecified site of unspecified female breast: Secondary | ICD-10-CM | POA: Insufficient documentation

## 2012-02-26 MED ORDER — IOHEXOL 300 MG/ML  SOLN
80.0000 mL | Freq: Once | INTRAMUSCULAR | Status: AC | PRN
  Administered 2012-02-26: 80 mL via INTRAVENOUS

## 2012-02-27 ENCOUNTER — Telehealth: Payer: Self-pay | Admitting: Oncology

## 2012-02-27 ENCOUNTER — Encounter: Payer: Self-pay | Admitting: *Deleted

## 2012-02-27 ENCOUNTER — Other Ambulatory Visit: Payer: Self-pay | Admitting: *Deleted

## 2012-02-27 ENCOUNTER — Ambulatory Visit (HOSPITAL_BASED_OUTPATIENT_CLINIC_OR_DEPARTMENT_OTHER): Payer: 59 | Admitting: Oncology

## 2012-02-27 VITALS — BP 120/77 | HR 88 | Temp 97.7°F | Wt 163.3 lb

## 2012-02-27 DIAGNOSIS — Z853 Personal history of malignant neoplasm of breast: Secondary | ICD-10-CM

## 2012-02-27 DIAGNOSIS — C50919 Malignant neoplasm of unspecified site of unspecified female breast: Secondary | ICD-10-CM

## 2012-02-27 DIAGNOSIS — Z170 Estrogen receptor positive status [ER+]: Secondary | ICD-10-CM

## 2012-02-27 DIAGNOSIS — Z9221 Personal history of antineoplastic chemotherapy: Secondary | ICD-10-CM

## 2012-02-27 MED ORDER — VENLAFAXINE HCL ER 75 MG PO CP24: 75.0000 mg | ORAL_CAPSULE | Freq: Every day | ORAL | Status: DC

## 2012-02-27 MED ORDER — LORAZEPAM 0.5 MG PO TABS: 0.5000 mg | ORAL_TABLET | Freq: Three times a day (TID) | ORAL | Status: DC | PRN

## 2012-02-27 NOTE — Progress Notes (Signed)
ID: Christina Blackwell   DOB: 11-03-1966  MR#: [id number]  WUJ#:[id number]  HISTORY OF PRESENT ILLNESS:  INTERVAL HISTORY: Since her last visit here, Christina Blackwell had her right lumpectomy and sentinel lymph node sampling under Dr. Ovidio Blackwell. The pathology confirmed an invasive lobular carcinoma, measuring 4.3 cm. Unfortunately as frequently happens with lobular tumors, the margins were positive. She also had both sentinel lymph nodes positive.  REVIEW OF SYSTEMS: She is having a hard time dealing with this. She met with Dr. Kathrynn Blackwell and somehow ended up feeling that she was at dense store. She is crying all the time, not exercising, not eating or sleeping well. She has a little bit of soreness from her surgery but otherwise a detailed review of systems was noncontributory  PAST MEDICAL HISTORY: Past Medical History  Diagnosis Date  . History of bilateral breast implants   . GERD (gastroesophageal reflux disease)   . Complication of anesthesia   . PONV (postoperative nausea and vomiting)   . History of radiation therapy 03/30/02-05/16/02    left breast   . History of chemotherapy     cyclosphosphamide/ doxorubicin  . Breast cancer 2002    left   . Breast cancer 2012    right  . Anxiety   . Depression   . Glaucoma     PAST SURGICAL HISTORY: Past Surgical History  Procedure Date  . Portacath placement   . Portacath removal   . Tonsillectomy and adenoidectomy   . Breast lumpectomy 11/2001    left  . Sentinel lymph node biopsy 11/2001    left axillary  . Breast enhancement surgery 2003    bilateral  . Breast lumpectomy w/ needle localization 02/05/2012    right/ slnbx  . Mirena iud     FAMILY HISTORY Family History  Problem Relation Age of Onset  . Cancer Mother     breast  . Cancer Paternal Aunt     breast  . Cancer Cousin     breast ca 56's 1st cousin    GYNECOLOGIC HISTORY:  SOCIAL HISTORY:    ADVANCED DIRECTIVES:  HEALTH MAINTENANCE: History  Substance Use Topics    . Smoking status: Never Smoker   . Smokeless tobacco: Not on file  . Alcohol Use: Yes     social occasio0nally 1-2      Colonoscopy:  PAP:  Bone density:  Lipid panel:  No Known Allergies  Current Outpatient Prescriptions  Medication Sig Dispense Refill  . cetirizine (ZYRTEC) 10 MG tablet Take 10 mg by mouth as needed.      . clonazePAM (KLONOPIN) 0.5 MG tablet Take 0.5 mg by mouth 2 (two) times daily as needed. Per patient stated 02/23/12      . HYDROcodone-acetaminophen (NORCO) 5-325 MG per tablet Take 1 tablet by mouth every 6 (six) hours as needed.      Marland Kitchen ibuprofen (ADVIL,MOTRIN) 200 MG tablet Take 200 mg by mouth every 6 (six) hours as needed.      Marland Kitchen LORazepam (ATIVAN) 0.5 MG tablet Take 0.5 mg by mouth as needed.      Marland Kitchen omeprazole (PRILOSEC) 20 MG capsule Take 20 mg by mouth daily.      . ranitidine (ZANTAC) 150 MG tablet Take 150 mg by mouth 2 (two) times daily.      Marland Kitchen zolpidem (AMBIEN) 5 MG tablet Take 5 mg by mouth at bedtime as needed.       No current facility-administered medications for this visit.   Facility-Administered Medications Ordered  in Other Visits  Medication Dose Route Frequency Provider Last Rate Last Dose  . iohexol (OMNIPAQUE) 300 MG/ML solution 80 mL  80 mL Intravenous Once PRN Medication Radiologist, MD   80 mL at 02/26/12 1124    OBJECTIVE: There were no vitals filed for this visit.   There is no height or weight on file to calculate BMI.    ECOG FS:  Sclerae unicteric Oropharynx clear No peripheral adenopathy  Lungs no rales or rhonchi Heart regular rate and rhythm Abd benign MSK no focal spinal tenderness, no peripheral edema Neuro: nonfocal Breasts:Right breast is status post recent lumpectomy and sentinel lymph node sampling. The incisions are healing very nicely. I do not pop in any mass. There are it are no skin changes or nipple retraction noted. And specifically no palpable right axillary adenopathy  LAB RESULTS: Lab Results   Component Value Date   WBC 7.0 02/20/2011   NEUTROABS 3.9 02/20/2011   HGB 13.7 02/05/2012   HCT 41.4 02/20/2011   MCV 89.4 02/20/2011   PLT 248 02/20/2011      Chemistry      Component Value Date/Time   NA 137 02/20/2011 1515   NA 137 02/20/2011 1515   K 3.8 02/20/2011 1515   K 3.8 02/20/2011 1515   CL 105 02/20/2011 1515   CL 105 02/20/2011 1515   CO2 24 02/20/2011 1515   CO2 24 02/20/2011 1515   BUN 19 02/20/2011 1515   BUN 19 02/20/2011 1515   CREATININE 0.73 02/20/2011 1515   CREATININE 0.73 02/20/2011 1515      Component Value Date/Time   CALCIUM 9.2 02/20/2011 1515   CALCIUM 9.2 02/20/2011 1515   ALKPHOS 50 02/20/2011 1515   ALKPHOS 50 02/20/2011 1515   AST 19 02/20/2011 1515   AST 19 02/20/2011 1515   ALT 20 02/20/2011 1515   ALT 20 02/20/2011 1515   BILITOT 0.5 02/20/2011 1515   BILITOT 0.5 02/20/2011 1515       Lab Results  Component Value Date   LABCA2 23 02/20/2011    No components found with this basename: [id number]    No results found for this basename: INR:1;PROTIME:1 in the last 168 hours  Urinalysis No results found for this basename: colorurine, appearanceur, labspec, phurine, glucoseu, hgbur, bilirubinur, ketonesur, proteinur, urobilinogen, nitrite, leukocytesur       STUDIES: Ct Chest W Contrast  02/26/2012  *RADIOLOGY REPORT*  Clinical Data: History of breast cancer.  CT CHEST WITH CONTRAST  Technique:  Multidetector CT imaging of the chest was performed following the standard protocol during bolus administration of intravenous contrast.  Contrast: 80mL OMNIPAQUE IOHEXOL 300 MG/ML IJ SOLN  Comparison: No priors.  Findings:  Mediastinum: The Heart size is normal. There is no significant pericardial fluid, thickening or pericardial calcification. No pathologically enlarged mediastinal, hilar or bilateral internal mammary lymph nodes. Esophagus is unremarkable in appearance.  Lungs/Pleura: A 6 mm subpleural nodule the periphery of the right lower lobe (image 29 of series 5).  No other definite  suspicious- appearing pulmonary nodules or masses are otherwise identified.  No consolidative airspace disease.  No pleural effusions.  A small focus of peripheral linear ground-glass attenuation in the posterior aspect of the right upper lobe likely reflects some scarring.  Upper Abdomen: Unremarkable.  Musculoskeletal: The patient is status post bilateral subpectoral breast implants.  Post lumpectomy changes are noted in the anterior aspect of the left breast.  Anterior and inferior to the right- sided implant there is a  large 8.9 x 4.4 cm low attenuation collection that may represent a postoperative seroma.  No definite axillary lymphadenopathy. There are no aggressive appearing lytic or blastic lesions noted in the visualized portions of the skeleton.  IMPRESSION: 1.  No definite findings to suggest metastatic disease in the thorax. There is a 6 mm pulmonary nodule in the posterior aspect of the right lower lobe, which is subpleural in location and nonspecific.  Statistically, this is likely to represent a subpleural lymph node, however, continued attention on future follow up studies is recommended.  At this time, a 12-month follow- up CT scan would be recommended.   2.  Probable postoperative seroma (per chart review, the patient underwent right-sided lumpectomy on the 02/05/2012) in the anterior/inferior aspect of the right breast, as above. 3.  Status post left lumpectomy and bilateral breast augmentation with subpectoral implants.  Original Report Authenticated By: Florencia Reasons, M.D.   Nm Sentinel Node Inj-no Rpt (breast)  02/05/2012  CLINICAL DATA: right breast cancer   Sulfur colloid was injected intradermally by the nuclear medicine  technologist for breast cancer sentinel node localization.     Mm Breast Surgical Specimen  02/05/2012  *RADIOLOGY REPORT*  Clinical Data:  46 year old female with newly diagnosed right breast cancer.  Wire localization prior to right lumpectomy.  NEEDLE  LOCALIZATION WITH MAMMOGRAPHIC GUIDANCE AND SPECIMEN RADIOGRAPH  Patient presents for needle localization prior to right lumpectomy. I met with the patient and we discussed the procedure of needle localization including benefits and alternatives. We discussed the high likelihood of a successful procedure. We discussed the risks of the procedure, including infection, bleeding, tissue injury, and further surgery. Informed, written consent was given.  Using mammographic guidance, sterile technique, 2% lidocaine and a 7 cm modified Kopans needle, the biopsy clip and mass were localized using a lateral approach.  The films are marked for Dr. Ezzard Standing.  Specimen radiograph was performed at day surgery, and confirms the biopsy clip and wire present in the tissue sample.  The specimen is marked for pathology.  IMPRESSION: Needle localization right breast.  No apparent complications.  Original Report Authenticated By: Rosendo Gros, M.D.   Mm Breast Wire Localization Right  02/05/2012  *RADIOLOGY REPORT*  Clinical Data:  46 year old female with newly diagnosed right breast cancer.  Wire localization prior to right lumpectomy.  NEEDLE LOCALIZATION WITH MAMMOGRAPHIC GUIDANCE AND SPECIMEN RADIOGRAPH  Patient presents for needle localization prior to right lumpectomy. I met with the patient and we discussed the procedure of needle localization including benefits and alternatives. We discussed the high likelihood of a successful procedure. We discussed the risks of the procedure, including infection, bleeding, tissue injury, and further surgery. Informed, written consent was given.  Using mammographic guidance, sterile technique, 2% lidocaine and a 7 cm modified Kopans needle, the biopsy clip and mass were localized using a lateral approach.  The films are marked for Dr. Ezzard Standing.  Specimen radiograph was performed at day surgery, and confirms the biopsy clip and wire present in the tissue sample.  The specimen is marked for  pathology.  IMPRESSION: Needle localization right breast.  No apparent complications.  Original Report Authenticated By: Rosendo Gros, M.D.    ASSESSMENT: 46 year old BRCA 1-2 negative [place] woman   (1) status post left lumpectomy and sentinel lymph node biopsy December 2002 for a 1.9 cm invasive ductal carcinoma, no lymph node involvement, triple negative, treated with 4 cycles of Cytoxan and Adriamycin followed by radiation, with no evidence of  disease recurrence to date.    (2) status post Right lumpectomy and sentinel lymph node sampling 02/05/2012 for a T2 in one-to invasive lobular breast cancer, estrogen receptor 85% and progesterone receptor 98% positive, with HER-2 nonamplified and an MIB-1 of 9%; with positive margins.   PLAN: She is ready scheduled for reexcision and plans on breast conservation if possible. She understands that with lobular is it very hard to assess margins and she again in may have positive margins. Assuming we do get clear margins and that she follows up with radiation eventually there is absolutely no contraindication to breast conservation and I again encouraged her to keep her breast if at all possible.  She will need adjuvant chemotherapy and we will use Cytoxan and docetaxel. This will be given every 3 weeks x4. She does not 1 a port. She is very concerned about nausea and we will use our strongest anti-emetics given her prior experiences. Once she is done with chemotherapy she will proceed to radiation and then anti-estrogen therapy.  She has been considering bilateral salpingo-oophorectomy. Since she turns out to be BRCA negative there is no strong indication for that and we will simply wait on it especially as further chemotherapy may well flip her ovaries into menopause.  She is going to return to see me on March 29. We will plan to start chemotherapy on April 4 assuming there is no other reason for delay.  Today I started her on Effexor 75 mg daily and  wrote her for lorazepam 0.5 mg to take twice a day as needed. I have also asked our social worker to call her at to inform her of our support services   Damante Spragg C    02/27/2012

## 2012-02-27 NOTE — Telephone Encounter (Signed)
gve the pt her march-June 2013 appt calendars

## 2012-02-27 NOTE — Progress Notes (Signed)
Addended by: Jeanette Caprice C on: 02/27/2012 10:22 AM   Modules accepted: Level of Service

## 2012-03-01 ENCOUNTER — Encounter: Payer: Self-pay | Admitting: *Deleted

## 2012-03-01 ENCOUNTER — Telehealth: Payer: Self-pay | Admitting: *Deleted

## 2012-03-01 NOTE — Telephone Encounter (Signed)
per orders from 02-27-2012 labs have all ready been added on for the 03-18-2012

## 2012-03-01 NOTE — Progress Notes (Signed)
[facility] Psychosocial Distress Screening Clinical Social Work  Clinical Social Work was referred by Systems developer and distress screening protocol.  The patient scored a 10 on the Psychosocial Distress Thermometer which indicates severe distress. Clinical Social Worker was unable to meet with pt in the exam room; therefore contacted pt at home to assess for distress and other psychosocial needs.  Pt stated she was feeling anxious about having surgery tomorrow.  CSW validated pt's concerns and offered support in processing her feelings.  CSW informed pt of the support team and support services available at [facility].  Pt expressed interest in support programs, but would like to meet with CSW before agreeing to a referral.  CSW and pt plan to meet after her appointment on 03/12/12 to further discuss support opportunities and pt's needs.  Pt was appreciative of contact, and CSW encouraged pt to call if she had any questions or concerns.        Clinical Social Worker follow up needed: yes  If yes, follow up plan: CSW and pt are schedule to meet on 03/12/12 to discuss support opportunities and pt needs.    Tamala Julian, MSW, LCSW Clinical Social Worker [facility] [phone]

## 2012-03-02 ENCOUNTER — Encounter (HOSPITAL_BASED_OUTPATIENT_CLINIC_OR_DEPARTMENT_OTHER): Payer: Self-pay | Admitting: *Deleted

## 2012-03-02 ENCOUNTER — Ambulatory Visit (HOSPITAL_BASED_OUTPATIENT_CLINIC_OR_DEPARTMENT_OTHER)
Admission: RE | Admit: 2012-03-02 | Discharge: 2012-03-02 | Disposition: A | Discharge status: Home / Self Care | Payer: 59 | Source: Ambulatory Visit | Attending: Surgery | Admitting: Surgery

## 2012-03-02 ENCOUNTER — Encounter (HOSPITAL_BASED_OUTPATIENT_CLINIC_OR_DEPARTMENT_OTHER)
Admission: RE | Disposition: A | Discharge status: Home / Self Care | Payer: Self-pay | Source: Ambulatory Visit | Attending: Surgery

## 2012-03-02 ENCOUNTER — Ambulatory Visit (HOSPITAL_BASED_OUTPATIENT_CLINIC_OR_DEPARTMENT_OTHER): Payer: 59 | Admitting: Certified Registered"

## 2012-03-02 ENCOUNTER — Encounter (HOSPITAL_BASED_OUTPATIENT_CLINIC_OR_DEPARTMENT_OTHER): Payer: Self-pay | Admitting: Certified Registered"

## 2012-03-02 DIAGNOSIS — K219 Gastro-esophageal reflux disease without esophagitis: Secondary | ICD-10-CM | POA: Insufficient documentation

## 2012-03-02 DIAGNOSIS — C50919 Malignant neoplasm of unspecified site of unspecified female breast: Secondary | ICD-10-CM

## 2012-03-02 DIAGNOSIS — D0590 Unspecified type of carcinoma in situ of unspecified breast: Secondary | ICD-10-CM | POA: Insufficient documentation

## 2012-03-02 DIAGNOSIS — C773 Secondary and unspecified malignant neoplasm of axilla and upper limb lymph nodes: Secondary | ICD-10-CM | POA: Insufficient documentation

## 2012-03-02 HISTORY — PX: AXILLARY LYMPH NODE DISSECTION: SHX5229

## 2012-03-02 SURGERY — EXCISION, LESION, BREAST
Anesthesia: General | Site: Breast | Laterality: Right | Wound class: Clean

## 2012-03-02 MED ORDER — ONDANSETRON HCL 4 MG/2ML IJ SOLN: 4.0000 mg | Freq: Once | INTRAMUSCULAR | Status: DC | PRN

## 2012-03-02 MED ORDER — CEFAZOLIN SODIUM 1-5 GM-% IV SOLN
1.0000 g | INTRAVENOUS | Status: AC
  Administered 2012-03-02: 2 g via INTRAVENOUS

## 2012-03-02 MED ORDER — BUPIVACAINE HCL (PF) 0.25 % IJ SOLN
INTRAMUSCULAR | Status: DC | PRN
  Administered 2012-03-02: 30 mL

## 2012-03-02 MED ORDER — CHLORHEXIDINE GLUCONATE 4 % EX LIQD: 1.0000 "application " | Freq: Once | CUTANEOUS | Status: DC

## 2012-03-02 MED ORDER — ACETAMINOPHEN 10 MG/ML IV SOLN
INTRAVENOUS | Status: DC | PRN
  Administered 2012-03-02: 1000 mg via INTRAVENOUS

## 2012-03-02 MED ORDER — PROPOFOL 10 MG/ML IV EMUL
INTRAVENOUS | Status: DC | PRN
  Administered 2012-03-02: 200 mg via INTRAVENOUS

## 2012-03-02 MED ORDER — ONDANSETRON HCL 4 MG/2ML IJ SOLN
INTRAMUSCULAR | Status: DC | PRN
  Administered 2012-03-02: 4 mg via INTRAVENOUS

## 2012-03-02 MED ORDER — CEFAZOLIN SODIUM 1-5 GM-% IV SOLN: 1.0000 g | INTRAVENOUS | Status: DC

## 2012-03-02 MED ORDER — LIDOCAINE HCL (CARDIAC) 20 MG/ML IV SOLN
INTRAVENOUS | Status: DC | PRN
  Administered 2012-03-02: 70 mg via INTRAVENOUS

## 2012-03-02 MED ORDER — ACETAMINOPHEN 10 MG/ML IV SOLN: 1000.0000 mg | Freq: Four times a day (QID) | INTRAVENOUS | Status: DC

## 2012-03-02 MED ORDER — HYDROCODONE-ACETAMINOPHEN 5-325 MG PO TABS
1.0000 | ORAL_TABLET | Freq: Once | ORAL | Status: AC
  Administered 2012-03-02: 1 via ORAL

## 2012-03-02 MED ORDER — DEXAMETHASONE SODIUM PHOSPHATE 4 MG/ML IJ SOLN
INTRAMUSCULAR | Status: DC | PRN
  Administered 2012-03-02: 10 mg via INTRAVENOUS

## 2012-03-02 MED ORDER — MORPHINE SULFATE 2 MG/ML IJ SOLN: 0.0500 mg/kg | INTRAMUSCULAR | Status: DC | PRN

## 2012-03-02 MED ORDER — SCOPOLAMINE 1 MG/3DAYS TD PT72: 1.0000 | MEDICATED_PATCH | TRANSDERMAL | Status: DC

## 2012-03-02 MED ORDER — MIDAZOLAM HCL 5 MG/5ML IJ SOLN
INTRAMUSCULAR | Status: DC | PRN
  Administered 2012-03-02: 1 mg via INTRAVENOUS
  Administered 2012-03-02: 2 mg via INTRAVENOUS

## 2012-03-02 MED ORDER — HYDROMORPHONE HCL PF 1 MG/ML IJ SOLN
0.2500 mg | INTRAMUSCULAR | Status: DC | PRN
  Administered 2012-03-02 (×2): 0.5 mg via INTRAVENOUS

## 2012-03-02 MED ORDER — HYDROCODONE-ACETAMINOPHEN 5-325 MG PO TABS: 1.0000 | ORAL_TABLET | Freq: Four times a day (QID) | ORAL | Status: AC | PRN

## 2012-03-02 MED ORDER — FENTANYL CITRATE 0.05 MG/ML IJ SOLN
INTRAMUSCULAR | Status: DC | PRN
  Administered 2012-03-02: 25 ug via INTRAVENOUS
  Administered 2012-03-02: 50 ug via INTRAVENOUS
  Administered 2012-03-02: 100 ug via INTRAVENOUS
  Administered 2012-03-02: 25 ug via INTRAVENOUS

## 2012-03-02 MED ORDER — LACTATED RINGERS IV SOLN
INTRAVENOUS | Status: DC
  Administered 2012-03-02: 20 mL/h via INTRAVENOUS
  Administered 2012-03-02 (×2): via INTRAVENOUS

## 2012-03-02 SURGICAL SUPPLY — 75 items
ADH SKN CLS APL DERMABOND .7 (GAUZE/BANDAGES/DRESSINGS) ×2
APL SKNCLS STERI-STRIP NONHPOA (GAUZE/BANDAGES/DRESSINGS)
APPLIER CLIP 11 MED OPEN (CLIP) ×3
APR CLP MED 11 20 MLT OPN (CLIP) ×2
BANDAGE ELASTIC 6 VELCRO ST LF (GAUZE/BANDAGES/DRESSINGS) IMPLANT
BENZOIN TINCTURE PRP APPL 2/3 (GAUZE/BANDAGES/DRESSINGS) IMPLANT
BINDER BREAST LRG (GAUZE/BANDAGES/DRESSINGS) IMPLANT
BINDER BREAST MEDIUM (GAUZE/BANDAGES/DRESSINGS) IMPLANT
BINDER BREAST XLRG (GAUZE/BANDAGES/DRESSINGS) ×3 IMPLANT
BINDER BREAST XXLRG (GAUZE/BANDAGES/DRESSINGS) IMPLANT
BLADE SURG 10 STRL SS (BLADE) ×3 IMPLANT
BLADE SURG 15 STRL LF DISP TIS (BLADE) ×2 IMPLANT
BLADE SURG 15 STRL SS (BLADE) ×3
CANISTER SUCTION 1200CC (MISCELLANEOUS) ×3 IMPLANT
CHLORAPREP W/TINT 26ML (MISCELLANEOUS) ×3 IMPLANT
CLEANER CAUTERY TIP 5X5 PAD (MISCELLANEOUS) ×2 IMPLANT
CLIP APPLIE 11 MED OPEN (CLIP) ×2 IMPLANT
CLIP TI MEDIUM 6 (CLIP) IMPLANT
CLIP TI WIDE RED SMALL 6 (CLIP) ×3 IMPLANT
CLOTH BEACON ORANGE TIMEOUT ST (SAFETY) ×3 IMPLANT
COVER MAYO STAND STRL (DRAPES) ×3 IMPLANT
COVER PROBE 5X48 (MISCELLANEOUS)
COVER TABLE BACK 60X90 (DRAPES) ×3 IMPLANT
DECANTER SPIKE VIAL GLASS SM (MISCELLANEOUS) IMPLANT
DERMABOND ADVANCED (GAUZE/BANDAGES/DRESSINGS) ×1
DERMABOND ADVANCED .7 DNX12 (GAUZE/BANDAGES/DRESSINGS) ×2 IMPLANT
DEVICE DUBIN W/COMP PLATE 8390 (MISCELLANEOUS) IMPLANT
DRAIN CHANNEL 19F RND (DRAIN) ×3 IMPLANT
DRAIN HEMOVAC 1/8 X 5 (WOUND CARE) IMPLANT
DRAPE LAPAROTOMY T 102X78X121 (DRAPES) ×3 IMPLANT
DRAPE PED LAPAROTOMY (DRAPES) IMPLANT
DRAPE UTILITY XL STRL (DRAPES) ×3 IMPLANT
ELECT BLADE 4.0 EZ CLEAN MEGAD (MISCELLANEOUS) ×3
ELECT COATED BLADE 2.86 ST (ELECTRODE) ×3 IMPLANT
ELECT REM PT RETURN 9FT ADLT (ELECTROSURGICAL) ×3
ELECTRODE BLDE 4.0 EZ CLN MEGD (MISCELLANEOUS) ×2 IMPLANT
ELECTRODE REM PT RTRN 9FT ADLT (ELECTROSURGICAL) ×2 IMPLANT
EVACUATOR SILICONE 100CC (DRAIN) ×3 IMPLANT
GAUZE SPONGE 4X4 12PLY STRL LF (GAUZE/BANDAGES/DRESSINGS) ×6 IMPLANT
GAUZE SPONGE 4X4 16PLY XRAY LF (GAUZE/BANDAGES/DRESSINGS) IMPLANT
GLOVE BIO SURGEON STRL SZ7 (GLOVE) ×3 IMPLANT
GLOVE SURG SIGNA 7.5 PF LTX (GLOVE) ×6 IMPLANT
GOWN PREVENTION PLUS XLARGE (GOWN DISPOSABLE) ×6 IMPLANT
KIT CVR 48X5XPRB PLUP LF (MISCELLANEOUS) IMPLANT
KIT MARKER MARGIN INK (KITS) ×3 IMPLANT
NDL SAFETY ECLIPSE 18X1.5 (NEEDLE) IMPLANT
NEEDLE HYPO 18GX1.5 SHARP (NEEDLE)
NEEDLE HYPO 25X1 1.5 SAFETY (NEEDLE) ×3 IMPLANT
NS IRRIG 1000ML POUR BTL (IV SOLUTION) ×3 IMPLANT
PACK BASIN DAY SURGERY FS (CUSTOM PROCEDURE TRAY) ×3 IMPLANT
PAD CLEANER CAUTERY TIP 5X5 (MISCELLANEOUS) ×1
PENCIL BUTTON HOLSTER BLD 10FT (ELECTRODE) ×3 IMPLANT
PIN SAFETY STERILE (MISCELLANEOUS) ×3 IMPLANT
SHEET MEDIUM DRAPE 40X70 STRL (DRAPES) ×3 IMPLANT
SLEEVE SCD COMPRESS KNEE MED (MISCELLANEOUS) ×3 IMPLANT
SPONGE LAP 18X18 X RAY DECT (DISPOSABLE) ×6 IMPLANT
SPONGE LAP 4X18 X RAY DECT (DISPOSABLE) ×3 IMPLANT
STAPLER VISISTAT 35W (STAPLE) ×3 IMPLANT
STRIP CLOSURE SKIN 1/2X4 (GAUZE/BANDAGES/DRESSINGS) IMPLANT
STRIP CLOSURE SKIN 1/4X4 (GAUZE/BANDAGES/DRESSINGS) IMPLANT
SUT ETHILON 2 0 FS 18 (SUTURE) ×3 IMPLANT
SUT SILK 3 0 TIES 17X18 (SUTURE) ×3
SUT SILK 3-0 18XBRD TIE BLK (SUTURE) ×2 IMPLANT
SUT VIC AB 5-0 PS2 18 (SUTURE) ×6 IMPLANT
SUT VICRYL 3-0 CR8 SH (SUTURE) ×6 IMPLANT
SUT VICRYL AB 2 0 TIE (SUTURE) IMPLANT
SUT VICRYL AB 2 0 TIES (SUTURE)
SYR BULB 3OZ (MISCELLANEOUS) ×3 IMPLANT
SYR CONTROL 10ML LL (SYRINGE) ×3 IMPLANT
TAPE CLOTH SURG 4X10 WHT LF (GAUZE/BANDAGES/DRESSINGS) ×3 IMPLANT
TOWEL OR 17X24 6PK STRL BLUE (TOWEL DISPOSABLE) ×3 IMPLANT
TOWEL OR NON WOVEN STRL DISP B (DISPOSABLE) ×3 IMPLANT
TUBE CONNECTING 20X1/4 (TUBING) ×3 IMPLANT
WATER STERILE IRR 1000ML POUR (IV SOLUTION) IMPLANT
YANKAUER SUCT BULB TIP NO VENT (SUCTIONS) ×3 IMPLANT

## 2012-03-02 NOTE — Transfer of Care (Signed)
Immediate Anesthesia Transfer of Care Note  Patient: Christina Blackwell  Procedure(s) Performed: Procedure(s) (LRB): RE-EXCISION OF BREAST LUMPECTOMY (Right) AXILLARY LYMPH NODE DISSECTION (Right)  Patient Location: PACU  Anesthesia Type: General  Level of Consciousness: awake, alert , oriented and patient cooperative  Airway & Oxygen Therapy: Patient Spontanous Breathing and Patient connected to face mask oxygen  Post-op Assessment: Report given to PACU RN and Post -op Vital signs reviewed and stable  Post vital signs: Reviewed and stable  Complications: No apparent anesthesia complications

## 2012-03-02 NOTE — Brief Op Note (Signed)
03/02/2012  2:17 PM  PATIENT:  Christina Blackwell, 46 y.o., female, MRN: [id number]  PREOP DIAGNOSIS:  right breast cancer  POSTOP DIAGNOSIS:   Right breast cancer (T2, N1), positive lateral margin from prior lumpectomy.  PROCEDURE:   Procedure(s): RE-EXCISION OF right BREAST LUMPECTOMY and right AXILLARY LYMPH NODE DISSECTION  SURGEON:   Ovidio Kin, M.D.  ASSISTANT:   None  ANESTHESIA:   general  Verlan Friends, CRNA - CRNA Kerby Nora, MD - Anesthesiologist  General  EBL:  100  ml  BLOOD ADMINISTERED: none  DRAINS: none   LOCAL MEDICATIONS USED:   30 cc 1/4% marcaine  SPECIMEN:   Right breast lumpectomy, lateral margin right breast, right axillary contents.  COUNTS CORRECT:  YES  INDICATIONS FOR PROCEDURE:  [name] is a 46 y.o. (DOB: 05-27-1966) white female whose primary care physician is Provider Not In System and comes for wide excision right breast lumpectomy and right axillary dissection   The indications and risks of the surgery were explained to the patient.  The risks include, but are not limited to, infection, bleeding, and nerve injury.  Note dictated to:   #[id number]

## 2012-03-02 NOTE — Anesthesia Postprocedure Evaluation (Signed)
  Anesthesia Post-op Note  Patient: Christina Blackwell  Procedure(s) Performed: Procedure(s) (LRB): RE-EXCISION OF BREAST LUMPECTOMY (Right) AXILLARY LYMPH NODE DISSECTION (Right)  Patient Location: PACU  Anesthesia Type: General  Level of Consciousness: awake, alert  and oriented  Airway and Oxygen Therapy: Patient Spontanous Breathing  Post-op Pain: none  Post-op Assessment: Post-op Vital signs reviewed, Patient's Cardiovascular Status Stable, Respiratory Function Stable, Patent Airway, No signs of Nausea or vomiting and Pain level controlled  Post-op Vital Signs: Reviewed and stable  Complications: No apparent anesthesia complications

## 2012-03-02 NOTE — Discharge Instructions (Addendum)
DISCHARGE INSTRUCTIONS TO PATIENT  Return to work on:  Will decide return to work when I see her back in the office.  Activity:  Driving - May drive in 3 to 4 days, if doing well.   Lifting - Use arm as normal, but limit lifting until I see you back in the office.  Wound Care:   Leave bandage until Thursday, then may remove bandage and shower.       Empty drain a least a couple of times a day and record amount.  Bring the recorded drainage to the office.  Diet:  As tolerated.  Follow up appointment:  Call Dr. Allene Pyo office [facility]) at [phone] for an appointment in about 1 week.  Medications and dosages:  Resume your home medications.  You have a prescription for:  Vicodin.  Call Dr. Ezzard Standing or his office  [phone]) if you have:  Temperature greater than 100.4,  Persistent nausea and vomiting,  Severe uncontrolled pain,  Redness, tenderness, or signs of infection (pain, swelling, redness, odor or green/yellow discharge around the site),  Difficulty breathing, headache or visual disturbances,  Any other questions or concerns you may have after discharge.  In an emergency, call 911 or go to an Emergency Department at a nearby hospital.  [facility]  [address] [place], [place] [address] [phone]   Post Anesthesia Home Care Instructions  Activity: Get plenty of rest for the remainder of the day. A responsible adult should stay with you for 24 hours following the procedure.  For the next 24 hours, DO NOT: -Drive a car -Advertising copywriter -Drink alcoholic beverages -Take any medication unless instructed by your physician -Make any legal decisions or sign important papers.  Meals: Start with liquid foods such as gelatin or soup. Progress to regular foods as tolerated. Avoid greasy, spicy, heavy foods. If nausea and/or vomiting occur, drink only clear liquids until the nausea and/or vomiting subsides. Call your physician if  vomiting continues.  Special Instructions/Symptoms: Your throat may feel dry or sore from the anesthesia or the breathing tube placed in your throat during surgery. If this causes discomfort, gargle with warm salt water. The discomfort should disappear within 24 hours.  About my Jackson-Pratt Bulb Drain  What is a Jackson-Pratt bulb? A Jackson-Pratt is a soft, round device used to collect drainage. It is connected to a long, thin drainage catheter, which is held in place by one or two small stiches near your surgical incision site. When the bulb is squeezed, it forms a vacuum, forcing the drainage to empty into the bulb.  Emptying the Jackson-Pratt bulb- To empty the bulb: 1. Release the plug on the top of the bulb. 2. Pour the bulb's contents into a measuring container which your nurse will provide. 3. Record the time emptied and amount of drainage. Empty the drain(s) as often as your     doctor or nurse recommends.  Date                  Time                    Amount (Drain 1)                 Amount (Drain 2)  _____________________________________________________________________  _____________________________________________________________________  _____________________________________________________________________  _____________________________________________________________________  _____________________________________________________________________  _____________________________________________________________________  _____________________________________________________________________  _____________________________________________________________________  Squeezing the Jackson-Pratt Bulb- To squeeze the bulb: 1. Make sure the plug at the top of the bulb  is open. 2. Squeeze the bulb tightly in your fist. You will hear air squeezing from the bulb. 3. Replace the plug while the bulb is squeezed. 4. Use a safety pin to attach the bulb to your clothing. This will keep  the catheter from     pulling at the bulb insertion site.  When to call your doctor- Call your doctor if:  Drain site becomes red, swollen or hot.  You have a fever greater than 101 degrees F.  There is oozing at the drain site.  Drain falls out (apply a guaze bandage over the drain hole and secure it with tape).  Drainage increases daily not related to activity patterns. (You will usually have more drainage when you are active than when you are resting.)  Drainage has a bad odor.

## 2012-03-02 NOTE — Anesthesia Procedure Notes (Signed)
Procedure Name: LMA Insertion Date/Time: 03/02/2012 12:30 PM Performed by: Verlan Friends Pre-anesthesia Checklist: Patient identified, Emergency Drugs available, Suction available, Patient being monitored and Timeout performed Patient Re-evaluated:Patient Re-evaluated prior to inductionOxygen Delivery Method: Circle System Utilized Preoxygenation: Pre-oxygenation with 100% oxygen Intubation Type: IV induction Ventilation: Mask ventilation without difficulty LMA: LMA inserted LMA Size: 4.0 Number of attempts: 1 (atraumatic) Airway Equipment and Method: bite block (Lett posterior bite gard used) Placement Confirmation: positive ETCO2 Tube secured with: Tape Dental Injury: Teeth and Oropharynx as per pre-operative assessment  Comments: _Patient has max. Roundhouse crowns in very good condition

## 2012-03-02 NOTE — Anesthesia Preprocedure Evaluation (Signed)
Anesthesia Evaluation  Patient identified by MRN, date of birth, ID band Patient awake    Reviewed: Allergy & Precautions, H&P , NPO status , Patient's Chart, lab work & pertinent test results  History of Anesthesia Complications (+) PONV  Airway Mallampati: I TM Distance: >3 FB Neck ROM: Full    Dental  (+) Teeth Intact and Dental Advisory Given   Pulmonary          Cardiovascular Rhythm:Regular     Neuro/Psych    GI/Hepatic   Endo/Other    Renal/GU      Musculoskeletal   Abdominal   Peds  Hematology   Anesthesia Other Findings   Reproductive/Obstetrics                           Anesthesia Physical Anesthesia Plan  ASA: II  Anesthesia Plan:    Post-op Pain Management:    Induction: Intravenous  Airway Management Planned: LMA  Additional Equipment:   Intra-op Plan:   Post-operative Plan: Extubation in OR  Informed Consent:   Dental advisory given  Plan Discussed with: CRNA, Anesthesiologist and Surgeon  Anesthesia Plan Comments:         Anesthesia Quick Evaluation

## 2012-03-02 NOTE — H&P (View-Only) (Signed)
 Re:   Christina Blackwell DOB:   10/22/1966 MRN:   [id number]  ASSESSMENT AND PLAN: 1.  Right breast cancer.  Lobular.  About 7 o'clock position.  ER - 85%.  PR - 98%. Ki67 - 9%.  Her2Neu - neg.  Saw Dr. G. Magrinat.  Path reveals 4.3 cm invasive lobular ca with positive lateral margins.  And 2/2 nodes positive.  Dr. Marginat is out of town for a week.  Will discuss with Dr. M. Manning.    She needs wider excision or mastectomy, possible axillary node dissection, probable porta cath.  She wants to try a wider excision of the lumpectomy cavity.  The patient had a terrible experience with a porta cath during her last chemo and she is adamant about not having a porta cath.  I explained the findings with the patient and the options going forward. [BRCA 1/2 neg. 01/26/2012.  DN  02/18/2012] [Jayla has seen Drs. Manning and Holderness.  She is to see Dr. Magrinat this Friday.  She wants to go ahead with a wide excision of her right breast and right axillary node dissection. This is scheduled for 03/02/2012.  I talked to her on the phone.  DN 02/23/2012] 2.  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 Had cyclophosphamide and doxorubicin.  Radiation by Dr. M. Manning. 3.  Has subpectoral implants.  By Dr. H. Holderness. 4.  She had a lot of pain with the nuc med injection.  I gave her some EMLA cream    Chief Complaint  Patient presents with  . Routine Post Op   REFERRING PHYSICIAN:   Dr. G. Magrinat  HISTORY OF PRESENT ILLNESS: Christina Blackwell is a 46 y.o. (DOB: 03/21/1966)  white female whose primary care physician is  Lori Beane, PA, [facility] [place], and post op right breast lumpectomy and right axillary SLNBx.  Unfortunately, her final path report showed postitive lateral margins and 2/2 positive nodes.   Her right breast biopsy showed Right breast cancer, Lobular.  ER - 85%.  PR - 98%. Ki67 - 9%.  Her2Neu - neg.  Uses Mirena implant. Mother had breast cancer at age  60. (about 3 years ago) Father's sister had breast cancer in her 60's.  First cousin had breast cancer in her 50's.  She went through BRCA testing, which was negative.    Past Medical History  Diagnosis Date  . History of bilateral breast implants   . GERD (gastroesophageal reflux disease)   . Breast cancer 2002    left   . Breast cancer 2012    right  . Complication of anesthesia   . PONV (postoperative nausea and vomiting)       Past Surgical History  Procedure Date  . Portacath placement   . Portacath removal   . Tonsillectomy and adenoidectomy   . Breast lumpectomy 11/2001    left  . Sentinel lymph node biopsy 11/2001    left axillary  . Breast enhancement surgery 2003    bilateral  . Breast lumpectomy w/ needle localization 02/05/2012    right/ slnbx      Current Outpatient Prescriptions  Medication Sig Dispense Refill  . HYDROcodone-acetaminophen (NORCO) 5-325 MG per tablet Take 1-2 tablets by mouth every 6 (six) hours as needed for pain.  30 tablet  1  . LORazepam (ATIVAN) 0.5 MG tablet Take 0.5 mg by mouth as needed.      . omeprazole (PRILOSEC) 20 MG capsule Take 20 mg   by mouth daily.      . zolpidem (AMBIEN) 5 MG tablet Take 5 mg by mouth at bedtime as needed.         No Known Allergies  REVIEW OF SYSTEMS: Urologic:  Kindey stones at age 11.  SOCIAL and FAMILY HISTORY: Divorced.  No children. Works for [employer].  PHYSICAL EXAM: BP 100/70  Pulse 72  Temp(Src) 98.7 F (37.1 C) (Temporal)  Resp 12  Ht 5' 5" (1.651 m)  Wt 165 lb 6 oz (75.014 kg)  BMI 27.52 kg/m2  General: WN WF who is alert and generally healthy appearing.  Breasts:  Right:  Thought tender, LOQ incision looks good.  Right axillary incision okay.  Left:  No mass or nipple discharge.  DATA REVIEWED: Path to patient.  Amay Mijangos, MD,  FACS [facility] [address]  [address]   [place], [place]    [address] Phone:  [phone] FAX:   [phone]  

## 2012-03-02 NOTE — Interval H&P Note (Signed)
History and Physical Interval Note:  03/02/2012 12:09 PM  Christina Blackwell  has presented today for surgery, with the diagnosis of right breast cancer  The various methods of treatment have been discussed with the patient and family. Husband is with patient today.  We have been through all the possibilities.  Will plan a re-excision of right breast lumpectomy and right axillary node dissection.  After consideration of risks, benefits and other options for treatment, the patient has consented to  Procedure(s) (LRB): RE-EXCISION OF BREAST LUMPECTOMY (Right) and AXILLARY LYMPH NODE DISSECTION (Right) as a surgical intervention .    The patients' history has been reviewed, patient examined, no change in status, stable for surgery.  I have reviewed the patients' chart and labs.  Questions were answered to the patient's satisfaction.     Jerlyn Pain H

## 2012-03-03 ENCOUNTER — Telehealth (INDEPENDENT_AMBULATORY_CARE_PROVIDER_SITE_OTHER): Payer: Self-pay

## 2012-03-03 LAB — POCT HEMOGLOBIN-HEMACUE: Hemoglobin: 14.3 g/dL (ref 12.0–15.0)

## 2012-03-03 NOTE — Telephone Encounter (Signed)
I called to remind the patient of her appt on 3/27.  She had a question about some burning in her armpit and wanted to know if it's normal.   I told her this is related to the incision in her armpit and is probably some nerve pain she is feeling that should get better.  She has a wrap on that makes it feel good.  She has one drain and plans to take the dressing off Friday.  She is concerned that she won't be able to due to being right handed.  I told her to give me a call and I can help her.

## 2012-03-03 NOTE — Op Note (Signed)
NAME:  ADDASYN, MCBREEN                  ACCOUNT NO.:  MEDICAL RECORD NO.:  [id number]  LOCATION:                                 FACILITY:  PHYSICIAN:  Sandria Bales. Ezzard Standing, M.D.  DATE OF BIRTH:  05-01-1966  DATE OF PROCEDURE:  03/02/2012 DATE OF DISCHARGE:                              OPERATIVE REPORT   PREOPERATIVE DIAGNOSIS:  Right breast cancer (T2 N1) with positive lateral margin from prior lumpectomy. Positive lymph nodes 2/2.  POSTOPERATIVE DIAGNOSIS:  Right breast cancer (T2 N1), positive lateral right margin from prior lumpectomy. Positive lymph nodes 2/2.  PROCEDURE:  Re-excision of right breast lumpectomy and right axillary lymph node dissection.  SURGEON:  Sandria Bales. Ezzard Standing, MD  FIRST ASSISTANT:  None.  ANESTHESIA:  General LMA, supervised by Dr. Johnathan Hausen.  ESTIMATED BLOOD LOSS:  100 mL.  LOCAL ANESTHETIC:  30 mL of 0.25% Marcaine.  COMPLICATIONS:  None.  INDICATION FOR PROCEDURE:  Ms. Lagace is a 46 year old white female, who sees Daphane Shepherd, [place], [place], in [place] who is her primary medical doctor.  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  She has been recently found to have a right breast cancer which was lobular.  She underwent a lumpectomy and right axillary sentinel lymph node biopsy on April 05, 2011. Unfortunately, final pathology revealed a 4.3 cm invasive lobular carcinoma with positive lateral margin 2 of 2 nodes positive.  So she comes back to the OR for 2 purposes, #1, to do a wide excision of her right breast lumpectomy and, secondly, to do a right axillary node dissection.  Potential complications of surgery explained to the patient.  Potential complications include, but are not limited to, bleeding, infection, nerve injury, and the possibility of needing further surgery.  OPERATIVE NOTE:  The patient presented to the room #5 at [facility].  Had a general LMA anesthesia, supervised by Dr. Johnathan Hausen. Her  right breast axilla prepped with ChloraPrep, sterilely draped.  She was given Ancef to initiate procedure.  A time-out was held and surgical checklist run.  She was sterilely draped.  I started with a right axillary lymph node dissection, and went down to the pectoralis major muscle, identified the long thoracodorsal nerve and tried to spare these nerves during the dissection.  I went up to the axillary vein, took at least 2 tributaries off the inferior aspect.  I went behind the pectoralis major muscle for trying to do a level 2 dissection.  She had several hard lymph nodes, but whether these are secondary to inflammatory changes or tumor was unclear at the time of surgery.  The axillary fat pad was removed and sent to Pathology.  I then controlled hemostasis again with Bovie electrocautery.  I did use some clips and some 3-0 Vicryl ties.  I left a lap in the wound and went ahead dissected out the breast.    I re-excised the old scar except for the medial 2 cm.  I tried to get a 2 cm extra lateral from the old scar, tried to take at least 1 or 2 cm of tissue were laterally.  I went  down all the way to the chest wall shows no more chest wall to takeoff because the margin was closed, the superior margin and involved the lateral margin.  I tried to take both about a cm thickness.  I then painted the specimen with the 6 color paint set, I have used mainly colors lateral, superior, medial which was opposite to the lateral.  I left the under surface of superior margin un-painted and skin covered anterior.  Hemostasis controlled with Bovie electrocautery and 3-0 Vicryl sutures. I placed new clips laterally and superiorly.  I irrigated both wounds out with saline using about a liter and half to two liters, I then closed, placed a 19 Blake drain in the right axilla.  I sewed in place with 2-0 nylon suture.  I closed the subcutaneous tissues with 3-0 Vicryl suture, multiple layers of the skin with  a 5-0 Vicryl suture.  I then painted both wounds with Dermabond and then placed heavy dressings and then wrapped in with breast support wrap.    Sponge and needle count were correct at the end of the case.  The patient tolerated procedure well.  She was transferred to the recovery room in good condition.   Sandria Bales. Ezzard Standing, M.D., FACS   DHN/MEDQ  D:  03/02/2012  T:  03/02/2012  Job:  [id number]  cc:   Lowella Dell, M.D. Oneita Hurt, M.D. Consuello Bossier., M.D. [facility], [place] Daphane Shepherd, physician assistant

## 2012-03-04 ENCOUNTER — Encounter (HOSPITAL_BASED_OUTPATIENT_CLINIC_OR_DEPARTMENT_OTHER): Payer: Self-pay | Admitting: Surgery

## 2012-03-05 ENCOUNTER — Telehealth (INDEPENDENT_AMBULATORY_CARE_PROVIDER_SITE_OTHER): Payer: Self-pay

## 2012-03-05 NOTE — Telephone Encounter (Signed)
Patient called again about the burning in her armpit to see if it's normal that it is still there.  She has been putting ice on it and is uncomfortable.  She also wants to know if she can leave the dressing on.  I spoke to Dr Ezzard Standing who said this burning is from the nerves and takes time to get better and she can leave the dsg on.  I informed the pt.  I told her when the path is in and Dr Ezzard Standing reviews it he will call her.

## 2012-03-09 ENCOUNTER — Telehealth (INDEPENDENT_AMBULATORY_CARE_PROVIDER_SITE_OTHER): Payer: Self-pay

## 2012-03-09 NOTE — Telephone Encounter (Signed)
Patient called to report that her arm pain is worse now and she can hardly lift her arm.  She is not sure if it is infected or not.  She still has her dressing on and her drain is in.  The output is decreasing.  She has been using ice and taking Vicodin and Ibuprofen without relief.  It is 4pm and I advised I don't have anyone here that could see her.  She has an appointment tomorrow here with [place].  I paged Dr Ezzard Standing.  He just said he needs to see her.  I asked the patient to arrive at 0830 to check in and I will put her in the room early and take her bandage off for Dr Ezzard Standing to see her incision

## 2012-03-10 ENCOUNTER — Ambulatory Visit (INDEPENDENT_AMBULATORY_CARE_PROVIDER_SITE_OTHER): Payer: 59 | Admitting: Surgery

## 2012-03-10 ENCOUNTER — Encounter: Payer: Self-pay | Admitting: *Deleted

## 2012-03-10 VITALS — BP 110/82 | HR 84 | Temp 98.7°F | Resp 18 | Ht 65.0 in | Wt 158.0 lb

## 2012-03-10 DIAGNOSIS — C50919 Malignant neoplasm of unspecified site of unspecified female breast: Secondary | ICD-10-CM

## 2012-03-10 NOTE — Progress Notes (Unsigned)
Attempted to return pt call. Pt left message requesting a call back from val, pt reports that she has several questions and is having some anxiety. A message was left for pt to call this desk nurse for assurance. Pt is to see mD on Friday 03/13/12

## 2012-03-10 NOTE — Progress Notes (Signed)
Re:   Christina Blackwell DOB:   1966-06-19 MRN:   [id number]  ASSESSMENT AND PLAN: 1.  Right breast cancer.  Lobular.  About 7 o'clock position.  ER - 85%.  PR - 98%. Ki67 - 9%.  Her2Neu - neg.  Saw Dr. Patrice Paradise.  Path reveals 4.3 cm invasive lobular ca with positive lateral margins.  And 2/2 nodes positive.  Re-excision (03/02/2012) - three small foci of Lobular carcinoma.  10/10 nodes positive.  But margins are clear.  Wounds okay. Gave her a prescription to [facility].  Patient to see Dr. Darnelle Catalan Friday, 3/29 and start chemo next week.  I'll see her back in 4 weeks.  2.  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 Had cyclophosphamide and doxorubicin.  Radiation by Dr. Reginal Lutes. 3.  Has subpectoral implants.  By Dr. Rexene Edison. Holderness. 4.  BRCA 1/2 neg. 01/26/2012.    No chief complaint on file.  REFERRING PHYSICIAN:   Dr. Reece Agar Magrinat  HISTORY OF PRESENT ILLNESS: Christina Blackwell is a 46 y.o. (DOB: [birth year])  white female whose primary care physician is  Daphane Shepherd, [place], [facility] [place], and post op right breast lumpectomy and right axillary SLNBx.  Unfortunately, her final path report showed postitive lateral margins and 2/2 positive nodes.   Her right breast biopsy showed Right breast cancer, Lobular.  ER - 85%.  PR - 98%. Ki67 - 9%.  Her2Neu - neg.  She comes back for follow up of a re-excision of her right breast lumpectomy and right ALND.  Unfortunately, her path showed 10/10 nodes positive.    Past Medical History  Diagnosis Date  . History of bilateral breast implants   . GERD (gastroesophageal reflux disease)   . Complication of anesthesia   . PONV (postoperative nausea and vomiting)   . History of radiation therapy 03/30/02-05/16/02    left breast   . History of chemotherapy     cyclosphosphamide/ doxorubicin  . Breast cancer 2002    left   . Breast cancer 2012    right  . Anxiety   . Depression   . Glaucoma      Current Outpatient  Prescriptions  Medication Sig Dispense Refill  . cetirizine (ZYRTEC) 10 MG tablet Take 10 mg by mouth as needed.      . clonazePAM (KLONOPIN) 0.5 MG tablet Take 0.5 mg by mouth 2 (two) times daily as needed. Per patient stated 02/23/12      . HYDROcodone-acetaminophen (NORCO) 5-325 MG per tablet Take 1 tablet by mouth every 6 (six) hours as needed.      Marland Kitchen HYDROcodone-acetaminophen (NORCO) 5-325 MG per tablet Take 1-2 tablets by mouth every 6 (six) hours as needed for pain.  30 tablet  1  . ibuprofen (ADVIL,MOTRIN) 200 MG tablet Take 200 mg by mouth every 6 (six) hours as needed.      Marland Kitchen LORazepam (ATIVAN) 0.5 MG tablet Take 1 tablet (0.5 mg total) by mouth every 8 (eight) hours as needed for anxiety.  60 tablet  3  . omeprazole (PRILOSEC) 20 MG capsule Take 20 mg by mouth daily.      . ranitidine (ZANTAC) 150 MG tablet Take 150 mg by mouth 2 (two) times daily.      Marland Kitchen venlafaxine (EFFEXOR-XR) 75 MG 24 hr capsule Take 1 capsule (75 mg total) by mouth daily.  30 capsule  3  . zolpidem (AMBIEN) 5 MG tablet Take 5 mg by mouth at bedtime  as needed.         No Known Allergies  REVIEW OF SYSTEMS: Urologic:  Kindey stones at age 24.  SOCIAL and FAMILY HISTORY: Divorced.  No children. Works for [employer].  PHYSICAL EXAM: There were no vitals taken for this visit.  General: WN WF who is alert and generally healthy appearing.  Breasts:  Right:  Right axillary drain < 20 cc/day. Drain removed and wounds look good.  Left:  No mass or nipple discharge.  DATA REVIEWED: Path to patient.  Ovidio Kin, MD,  [facility] [address]  [address]   [place], [place]    [address] Phone:  [phone] FAX:  [phone]

## 2012-03-11 ENCOUNTER — Telehealth: Payer: Self-pay | Admitting: *Deleted

## 2012-03-11 ENCOUNTER — Other Ambulatory Visit: Payer: Self-pay | Admitting: Oncology

## 2012-03-11 DIAGNOSIS — C50919 Malignant neoplasm of unspecified site of unspecified female breast: Secondary | ICD-10-CM

## 2012-03-11 NOTE — Telephone Encounter (Signed)
patient confirmed over the  phone the new date and time 03-16-2012 at 9:00am

## 2012-03-12 ENCOUNTER — Ambulatory Visit: Payer: 59 | Admitting: Oncology

## 2012-03-16 ENCOUNTER — Ambulatory Visit (HOSPITAL_BASED_OUTPATIENT_CLINIC_OR_DEPARTMENT_OTHER): Payer: 59 | Admitting: Oncology

## 2012-03-16 VITALS — BP 129/82 | HR 85 | Temp 98.6°F | Ht 65.0 in | Wt 160.5 lb

## 2012-03-16 DIAGNOSIS — C50919 Malignant neoplasm of unspecified site of unspecified female breast: Secondary | ICD-10-CM

## 2012-03-16 MED ORDER — ONDANSETRON HCL 8 MG PO TABS: 8.0000 mg | ORAL_TABLET | Freq: Three times a day (TID) | ORAL | Status: AC | PRN

## 2012-03-16 MED ORDER — DEXAMETHASONE 4 MG PO TABS: 8.0000 mg | ORAL_TABLET | Freq: Two times a day (BID) | ORAL | Status: AC

## 2012-03-16 MED ORDER — PROCHLORPERAZINE MALEATE 10 MG PO TABS: 10.0000 mg | ORAL_TABLET | Freq: Four times a day (QID) | ORAL | Status: DC | PRN

## 2012-03-16 NOTE — Progress Notes (Signed)
ID: [name]   DOB: 02-12-1966  MR#: [id number]  CSN#:[id number]  HISTORY OF PRESENT ILLNESS:  INTERVAL HISTORY: Christina Blackwell returns today with her friend Gabriel Rung for followup of her breast cancer. Since her last visit here she had her definitive surgery under Dr. Ezzard Standing and he found 10 out of the 10 lymph nodes sampled were positive. Margins are close but negative. She recovered well from her surgery, but still has significant numbness of soreness in the right upper extremity, no edema.  REVIEW OF SYSTEMS: She has not started doing her arm exercises. She has gone back to work. She sleeps 12 hours a day. She simply overwhelmed with her new diagnosis. She has not started the venlafaxine, which I think would be helpful to her. She is using the lorazepam at bedtime. Otherwise she has had no bleeding, fever, and she is controlling the soreness of postsurgical lady with Advil alone. A detailed review of systems was otherwise significant chiefly for her anxiety and depressive symptoms.  PAST MEDICAL HISTORY: Past Medical History  Diagnosis Date  . History of bilateral breast implants   . GERD (gastroesophageal reflux disease)   . Complication of anesthesia   . PONV (postoperative nausea and vomiting)   . History of radiation therapy 03/30/02-05/16/02    left breast   . History of chemotherapy     cyclosphosphamide/ doxorubicin  . Breast cancer 2002    left   . Breast cancer 2012    right  . Anxiety   . Depression   . Glaucoma     PAST SURGICAL HISTORY: Past Surgical History  Procedure Date  . Portacath placement   . Portacath removal   . Tonsillectomy and adenoidectomy   . Breast lumpectomy 11/2001    left  . Sentinel lymph node biopsy 11/2001    left axillary  . Breast enhancement surgery 2003    bilateral  . Breast lumpectomy w/ needle localization 02/05/2012    right/ slnbx  . Mirena iud   . Axillary lymph node dissection 03/02/2012    Procedure: AXILLARY LYMPH NODE DISSECTION;   Surgeon: Kandis Cocking, MD;  Location: [facility];  Service: General;  Laterality: Right;    FAMILY HISTORY Family History  Problem Relation Age of Onset  . Cancer Mother     breast  . Cancer Paternal Aunt     breast  . Cancer Cousin     breast ca 36's 1st cousin    GYNECOLOGIC HISTORY:  SOCIAL HISTORY:    ADVANCED DIRECTIVES:  HEALTH MAINTENANCE: History  Substance Use Topics  . Smoking status: Never Smoker   . Smokeless tobacco: Not on file  . Alcohol Use: Yes     social occasio0nally 1-2      Colonoscopy:  PAP:  Bone density:  Lipid panel:  No Known Allergies  Current Outpatient Prescriptions  Medication Sig Dispense Refill  . cetirizine (ZYRTEC) 10 MG tablet Take 10 mg by mouth as needed.      . clonazePAM (KLONOPIN) 0.5 MG tablet Take 0.5 mg by mouth 2 (two) times daily as needed. Per patient stated 02/23/12      . ibuprofen (ADVIL,MOTRIN) 200 MG tablet Take 200 mg by mouth every 6 (six) hours as needed.      Marland Kitchen LORazepam (ATIVAN) 0.5 MG tablet Take 1 tablet (0.5 mg total) by mouth every 8 (eight) hours as needed for anxiety.  60 tablet  3  . omeprazole (PRILOSEC) 20 MG capsule Take 20 mg by  mouth daily.      . ranitidine (ZANTAC) 150 MG tablet Take 150 mg by mouth 2 (two) times daily.      Marland Kitchen zolpidem (AMBIEN) 5 MG tablet Take 5 mg by mouth at bedtime as needed.      Marland Kitchen HYDROcodone-acetaminophen (NORCO) 5-325 MG per tablet Take 1 tablet by mouth every 6 (six) hours as needed.      . venlafaxine (EFFEXOR-XR) 75 MG 24 hr capsule Take 1 capsule (75 mg total) by mouth daily.  30 capsule  3    OBJECTIVE: Middle-aged white woman who appears anxious Filed Vitals:   03/16/12 0905  BP: 129/82  Pulse: 85  Temp: 98.6 F (37 C)     Body mass index is 26.71 kg/(m^2).    ECOG FS: 1  Sclerae unicteric Oropharynx clear No peripheral adenopathy  Lungs no rales or rhonchi Heart regular rate and rhythm Abd benign MSK no focal spinal tenderness, no  peripheral edema Neuro: nonfocal Breasts: The right breast incisions are healing very nicely. The cosmetic result is excellent. I do not palpate any suspicious masses in the right breast or axilla or in the left breast  LAB RESULTS: Lab Results  Component Value Date   WBC 7.0 02/20/2011   NEUTROABS 3.9 02/20/2011   HGB 14.3 03/02/2012   HCT 41.4 02/20/2011   MCV 89.4 02/20/2011   PLT 248 02/20/2011      Chemistry      Component Value Date/Time   NA 137 02/20/2011 1515   NA 137 02/20/2011 1515   K 3.8 02/20/2011 1515   K 3.8 02/20/2011 1515   CL 105 02/20/2011 1515   CL 105 02/20/2011 1515   CO2 24 02/20/2011 1515   CO2 24 02/20/2011 1515   BUN 19 02/20/2011 1515   BUN 19 02/20/2011 1515   CREATININE 0.73 02/20/2011 1515   CREATININE 0.73 02/20/2011 1515      Component Value Date/Time   CALCIUM 9.2 02/20/2011 1515   CALCIUM 9.2 02/20/2011 1515   ALKPHOS 50 02/20/2011 1515   ALKPHOS 50 02/20/2011 1515   AST 19 02/20/2011 1515   AST 19 02/20/2011 1515   ALT 20 02/20/2011 1515   ALT 20 02/20/2011 1515   BILITOT 0.5 02/20/2011 1515   BILITOT 0.5 02/20/2011 1515       Lab Results  Component Value Date   LABCA2 23 02/20/2011    No components found with this basename: [id number]    No results found for this basename: INR:1;PROTIME:1 in the last 168 hours  Urinalysis No results found for this basename: colorurine,  appearanceur,  labspec,  phurine,  glucoseu,  hgbur,  bilirubinur,  ketonesur,  proteinur,  urobilinogen,  nitrite,  leukocytesur       STUDIES: Ct Chest W Contrast  02/26/2012  *RADIOLOGY REPORT*  Clinical Data: History of breast cancer.  CT CHEST WITH CONTRAST  Technique:  Multidetector CT imaging of the chest was performed following the standard protocol during bolus administration of intravenous contrast.  Contrast: 80mL OMNIPAQUE IOHEXOL 300 MG/ML IJ SOLN  Comparison: No priors.  Findings:  Mediastinum: The Heart size is normal. There is no significant pericardial fluid, thickening or pericardial  calcification. No pathologically enlarged mediastinal, hilar or bilateral internal mammary lymph nodes. Esophagus is unremarkable in appearance.  Lungs/Pleura: A 6 mm subpleural nodule the periphery of the right lower lobe (image 29 of series 5).  No other definite suspicious- appearing pulmonary nodules or masses are otherwise identified.  No consolidative airspace  disease.  No pleural effusions.  A small focus of peripheral linear ground-glass attenuation in the posterior aspect of the right upper lobe likely reflects some scarring.  Upper Abdomen: Unremarkable.  Musculoskeletal: The patient is status post bilateral subpectoral breast implants.  Post lumpectomy changes are noted in the anterior aspect of the left breast.  Anterior and inferior to the right- sided implant there is a large 8.9 x 4.4 cm low attenuation collection that may represent a postoperative seroma.  No definite axillary lymphadenopathy. There are no aggressive appearing lytic or blastic lesions noted in the visualized portions of the skeleton.  IMPRESSION: 1.  No definite findings to suggest metastatic disease in the thorax. There is a 6 mm pulmonary nodule in the posterior aspect of the right lower lobe, which is subpleural in location and nonspecific.  Statistically, this is likely to represent a subpleural lymph node, however, continued attention on future follow up studies is recommended.  At this time, a 22-month follow- up CT scan would be recommended.   2.  Probable postoperative seroma (per chart review, the patient underwent right-sided lumpectomy on the 02/05/2012) in the anterior/inferior aspect of the right breast, as above. 3.  Status post left lumpectomy and bilateral breast augmentation with subpectoral implants.  Original Report Authenticated By: Florencia Reasons, M.D.     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    (2) status post Right lumpectomy and axillary lymph node dissection 03/02/2012 for a T2 N2 invasive lobular breast cancer, grade 1,  estrogen receptor 85% and progesterone receptor 98% positive, with HER-2 nonamplified and an MIB-1 of 9%;    PLAN:  The adjuvant! Program would quote her a risk of death within the next 10 years with local treatment only of 50% or so. Adding adjuvant anti-estrogens in chemotherapy especially cut that risk in half. That means this means she has a [place] for chance of being alive 10 years from now with standard treatment. I have encouraged her to go over these numbers often because there certainly a lot better than what she is expected.  She is starting chemotherapy in 2 days. She knows she needs to start her dexamethasone tomorrow. I gave her a "map" on how to take her antinausea medicines and I wrote her a prescription for dexamethasone ondansetron and prochlorperazine. She did well with Phenergan suppositories last time and certainly we can do that as well if necessary.  She will see [place] again on April 11, that's a week after her first cycle, and I have encouraged her to bring a list of side effects and complications so we can troubleshoot before the next cycle of treatment. I have encouraged her to start the venlafaxine and to start a daily walking program. She knows to call for any questions or concerns that may develop before the next visit here     Alexandra Posadas C    03/16/2012

## 2012-03-17 ENCOUNTER — Telehealth: Payer: Self-pay | Admitting: *Deleted

## 2012-03-17 ENCOUNTER — Telehealth: Payer: Self-pay | Admitting: Oncology

## 2012-03-17 ENCOUNTER — Other Ambulatory Visit: Payer: Self-pay | Admitting: Oncology

## 2012-03-17 NOTE — Telephone Encounter (Signed)
gave patient appointment for 04-08-2012 03-25-2012 04-29-2012 05-20-2012 printed out calendar and gave to the patient

## 2012-03-17 NOTE — Telephone Encounter (Signed)
lmonvm adviisng the pt of her r/s appt time on 03/19/2012 due to the np is out sick that day

## 2012-03-17 NOTE — Telephone Encounter (Signed)
S/w the pt and she is aware of the r/s appt times on 03/19/2012 of her appts due to the np is out sick. Pt is aware she will not be seeing the np.

## 2012-03-18 ENCOUNTER — Encounter: Payer: Self-pay | Admitting: Specialist

## 2012-03-18 ENCOUNTER — Ambulatory Visit (HOSPITAL_BASED_OUTPATIENT_CLINIC_OR_DEPARTMENT_OTHER): Payer: 59

## 2012-03-18 ENCOUNTER — Other Ambulatory Visit (HOSPITAL_BASED_OUTPATIENT_CLINIC_OR_DEPARTMENT_OTHER): Payer: 59 | Admitting: Lab

## 2012-03-18 VITALS — BP 115/68 | HR 91 | Temp 98.5°F

## 2012-03-18 DIAGNOSIS — C50919 Malignant neoplasm of unspecified site of unspecified female breast: Secondary | ICD-10-CM

## 2012-03-18 DIAGNOSIS — Z5111 Encounter for antineoplastic chemotherapy: Secondary | ICD-10-CM

## 2012-03-18 DIAGNOSIS — Z853 Personal history of malignant neoplasm of breast: Secondary | ICD-10-CM

## 2012-03-18 LAB — CBC WITH DIFFERENTIAL/PLATELET
BASO%: 0.1 % (ref 0.0–2.0)
EOS%: 0 % (ref 0.0–7.0)
HCT: 42.4 % (ref 34.8–46.6)
LYMPH%: 9 % — ABNORMAL LOW (ref 14.0–49.7)
MCH: 31.1 pg (ref 25.1–34.0)
MCHC: 34.9 g/dL (ref 31.5–36.0)
MCV: 89.1 fL (ref 79.5–101.0)
MONO%: 4.4 % (ref 0.0–14.0)
NEUT%: 86.5 % — ABNORMAL HIGH (ref 38.4–76.8)
Platelets: 324 10*3/uL (ref 145–400)
lymph#: 1.5 10*3/uL (ref 0.9–3.3)

## 2012-03-18 MED ORDER — SODIUM CHLORIDE 0.9 % IV SOLN
600.0000 mg/m2 | Freq: Once | INTRAVENOUS | Status: AC
  Administered 2012-03-18: 1100 mg via INTRAVENOUS
  Filled 2012-03-18: qty 55

## 2012-03-18 MED ORDER — SODIUM CHLORIDE 0.9 % IV SOLN
Freq: Once | INTRAVENOUS | Status: AC
  Administered 2012-03-18: 10:00:00 via INTRAVENOUS

## 2012-03-18 MED ORDER — DOCETAXEL CHEMO INJECTION 160 MG/16ML
75.0000 mg/m2 | Freq: Once | INTRAVENOUS | Status: AC
  Administered 2012-03-18: 140 mg via INTRAVENOUS
  Filled 2012-03-18: qty 14

## 2012-03-18 MED ORDER — ONDANSETRON 16 MG/50ML IVPB (CHCC)
16.0000 mg | Freq: Once | INTRAVENOUS | Status: AC
  Administered 2012-03-18: 16 mg via INTRAVENOUS

## 2012-03-18 MED ORDER — DEXAMETHASONE SODIUM PHOSPHATE 4 MG/ML IJ SOLN
20.0000 mg | Freq: Once | INTRAMUSCULAR | Status: AC
  Administered 2012-03-18: 20 mg via INTRAVENOUS

## 2012-03-18 NOTE — Progress Notes (Signed)
I saw patient in the infusion room, where she was getting her first chemotherapy.  She was accompanied by her best friend.  Christina Blackwell was very receptive to talking about available support services, many of which did not exist when she had breast cancer ten years ago.  She became tearful when she talked about this diagnosis and said she knew it meant she had a greater chance of dying.  She acknowledged that she is feeling anxious and that she is having trouble getting out of bed because she is so worried.  She indicated Dr. Darnelle Catalan had prescribed medication for depression but she said she would not take it, because she doesn't like the way it makes her feel.  I encouraged her to take advantage of our counseling services and gave her that information.  I also gave her information about the two breast cancer support groups, which she indicated she would try to attend.  I also gave [name] information about the [facility] and staff contacts there.  In addition, I talked with Zollie Scale, PA, who will see [name] soon and made her aware of my conversation with the patient.

## 2012-03-19 ENCOUNTER — Ambulatory Visit: Payer: 59

## 2012-03-22 ENCOUNTER — Other Ambulatory Visit: Payer: Self-pay | Admitting: *Deleted

## 2012-03-22 ENCOUNTER — Telehealth: Payer: Self-pay | Admitting: *Deleted

## 2012-03-22 MED ORDER — TEMAZEPAM 15 MG PO CAPS: 15.0000 mg | ORAL_CAPSULE | Freq: Every evening | ORAL | Status: DC | PRN

## 2012-03-22 NOTE — Telephone Encounter (Signed)
Called patient for chemotherapy f/u.  Reports she is "keeping nausea under control".  Eating and drinking with no emesis.  Her biggest problem is trouble sleeping.  Reports she is up and walking around.  Denies pain, "just can't get rest and I toss and turn until I get up.  Takes ambien 5mg  but this doesn't work anymore.  Has tries the lorazepam also.  Reports the tylenol PM gives her nightmares.  Uses [facility] on [address] In [place]. Will notify providers.  No questions or further side effects at this time.

## 2012-03-22 NOTE — Telephone Encounter (Signed)
Message copied by Augusto Garbe on Mon Mar 22, 2012  2:46 PM ------      Message from: Carola Rhine A      Created: Thu Mar 18, 2012  2:30 PM      Regarding: Chemo follow up call      Contact: [phone]       1st time Taxotere. Also had Cytoxan today. Has taken Adria and Cytoxan in 2002. Was very anxious at the beginning of treatment because she had severe nausea and vomiting with the treatment in '02. Pt was much more calm at end of treatment today.      Thanks, [employer]

## 2012-03-25 ENCOUNTER — Ambulatory Visit (HOSPITAL_BASED_OUTPATIENT_CLINIC_OR_DEPARTMENT_OTHER): Payer: 59 | Admitting: Physician Assistant

## 2012-03-25 ENCOUNTER — Telehealth: Payer: Self-pay | Admitting: *Deleted

## 2012-03-25 ENCOUNTER — Other Ambulatory Visit: Payer: 59 | Admitting: Lab

## 2012-03-25 ENCOUNTER — Encounter: Payer: Self-pay | Admitting: Physician Assistant

## 2012-03-25 VITALS — BP 102/73 | HR 111 | Temp 99.4°F | Ht 65.0 in | Wt 155.2 lb

## 2012-03-25 DIAGNOSIS — C50919 Malignant neoplasm of unspecified site of unspecified female breast: Secondary | ICD-10-CM

## 2012-03-25 LAB — CBC WITH DIFFERENTIAL/PLATELET
EOS%: 3.1 % (ref 0.0–7.0)
LYMPH%: 72.5 % — ABNORMAL HIGH (ref 14.0–49.7)
MCH: 31.2 pg (ref 25.1–34.0)
MCHC: 34.9 g/dL (ref 31.5–36.0)
MCV: 89.5 fL (ref 79.5–101.0)
MONO%: 21.8 % — ABNORMAL HIGH (ref 0.0–14.0)
Platelets: 174 10*3/uL (ref 145–400)
RBC: 4.33 10*6/uL (ref 3.70–5.45)
RDW: 12.9 % (ref 11.2–14.5)
nRBC: 0 % (ref 0–0)

## 2012-03-25 LAB — COMPREHENSIVE METABOLIC PANEL
ALT: 36 U/L — ABNORMAL HIGH (ref 0–35)
Alkaline Phosphatase: 58 U/L (ref 39–117)
Creatinine, Ser: 0.77 mg/dL (ref 0.50–1.10)
Glucose, Bld: 110 mg/dL — ABNORMAL HIGH (ref 70–99)
Sodium: 134 mEq/L — ABNORMAL LOW (ref 135–145)
Total Bilirubin: 1.2 mg/dL (ref 0.3–1.2)
Total Protein: 7.1 g/dL (ref 6.0–8.3)

## 2012-03-25 MED ORDER — CIPROFLOXACIN HCL 500 MG PO TABS: 500.0000 mg | ORAL_TABLET | Freq: Two times a day (BID) | ORAL | Status: AC

## 2012-03-25 MED ORDER — FLUCONAZOLE 100 MG PO TABS: 100.0000 mg | ORAL_TABLET | Freq: Every day | ORAL | Status: AC

## 2012-03-25 NOTE — Telephone Encounter (Signed)
Pt called to this RN to state that she feels she cannot make appt today due to " I can not get out of the bed ".  " I think I picked up something from work ..people were sick there ".  [name] denies fevers but states she was chilled pm 4/10 and this am " my equilibrium is awful.  This RN reviewed with pt recent treatment regimen including pt did not receive neulasta. This RN did strongly recommend pt to keep appt today and discussed reasons related to treatment side effects.  Plan at present is pt will come in for visit.

## 2012-03-25 NOTE — Progress Notes (Signed)
ID: DANAYSHA KIRN   DOB: 22-Jul-1966  MR#: [id number]  WUJ#:[id number]  HISTORY OF PRESENT ILLNESS: Maylani is a 46 year old [place] woman formerly followed here for a left-sided stage I invasive ductal carcinoma, which was treated with adjuvant chemotherapy completed in 2003. She was released from followup last year.  On 12/28/2011 she had bilateral screening mammography showing a possible area of distortion in the right breast. She was recalled for additional views 01/09/2012 and this confirmed an area of architectural distortion in the lateral aspect of the right breast measuring 2.9 cm. This was not palpable by exam. Biopsy was performed the same day and showed ([id number]) and invasive lobular breast cancer, e-cadherin negative, strongly estrogen receptor positive at 85%, progesterone receptor positive at 98%, with an MIB-1 of 9%, and no HER-2 amplification.  Patient is status post right lumpectomy and x-ray lymph node dissection 03/02/2012 for a T2 N2 invasive lobular breast carcinoma, grade 1. 10 of 10 lymph nodes were positive. Margins were close but negative.  The decision was made to proceed with adjuvant chemotherapy consisting of 4 q. three-week doses of docetaxel/cyclophosphamide with Neulasta on day 2 for granulocyte support.    INTERVAL HISTORY: Karoline Caldwell returns today for assessment of chemotoxicity, currently day 8 cycle 1 of 4 planned q. three-week doses of docetaxel/cyclophosphamide being given for her right breast carcinoma. NG declines the Neulasta injection on day 2. She received Neulasta when she was treated with chemotherapy in 2002, and had poor tolerance secondary to pain.  She contacted our office this morning prior to her appointment with extreme fatigue and said she "was having a hard time getting out of bed". Fortunately she denies any recent fevers.  The patient stopped taking Effexor due to in tolerance. She says it "made her feel funny" and describes a manic feeling.  She's not interested in further antidepressants at this time, although she continues to have signs of depression and anxiety.  REVIEW OF SYSTEMS:  [name]  had some chills last night but no fever and no night sweats. Her temp is normal here in the office today at 99. She had no nausea or emesis but does notice a decreased appetite. Her mouth is very sore and tender, although she has no oral ulcerations. No change in bowel habits. She tends to be a little constipated at baseline. She has some small hemorrhoids we will be following. She occasionally has a mild amount of rectal bleeding with bowel movement. No cough, phlegm production, or shortness of breath. No abnormal headaches or dizziness. No peripheral swelling. No evidence of peripheral neuropathy. No skin changes, rashes, nailbed changes, or abnormal bleeding.   A detailed review of systems is otherwise noncontributory.   PAST MEDICAL HISTORY: Past Medical History  Diagnosis Date  . History of bilateral breast implants   . GERD (gastroesophageal reflux disease)   . Complication of anesthesia   . PONV (postoperative nausea and vomiting)   . History of radiation therapy 03/30/02-05/16/02    left breast   . History of chemotherapy     cyclosphosphamide/ doxorubicin  . Breast cancer 2002    left   . Breast cancer 2012    right  . Anxiety   . Depression   . Glaucoma     PAST SURGICAL HISTORY: Past Surgical History  Procedure Date  . Portacath placement   . Portacath removal   . Tonsillectomy and adenoidectomy   . Breast lumpectomy 11/2001    left  . Sentinel lymph node biopsy 11/2001  left axillary  . Breast enhancement surgery 2003    bilateral  . Breast lumpectomy w/ needle localization 02/05/2012    right/ slnbx  . Mirena iud   . Axillary lymph node dissection 03/02/2012    Procedure: AXILLARY LYMPH NODE DISSECTION;  Surgeon: Kandis Cocking, MD;  Location: [facility];  Service: General;  Laterality: Right;     FAMILY HISTORY Family History  Problem Relation Age of Onset  . Cancer Mother     breast  . Cancer Paternal Aunt     breast  . Cancer Cousin     breast ca 16's 1st cousin    Gynecologic history:  Menarche age 52, she is GX P0. She is currently using a Mirena implant   Social history:  She works for [employer], but her job was recently eliminated, and she is temporarily employed by them at present. She is divorced and lives by herself, with 2 birds, and [place] parrot and a cockateal. 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   ADVANCED DIRECTIVES:  HEALTH MAINTENANCE: History  Substance Use Topics  . Smoking status: Never Smoker   . Smokeless tobacco: Not on file  . Alcohol Use: Yes     social occasio0nally 1-2      Colonoscopy:  PAP:  Bone density:  Lipid panel:  No Known Allergies  Current Outpatient Prescriptions  Medication Sig Dispense Refill  . cetirizine (ZYRTEC) 10 MG tablet Take 10 mg by mouth as needed.      . clonazePAM (KLONOPIN) 0.5 MG tablet Take 0.5 mg by mouth 2 (two) times daily as needed. Per patient stated 02/23/12      . dexamethasone (DECADRON) 4 MG tablet Take 2 tablets (8 mg total) by mouth 2 (two) times daily with a meal.  30 tablet  3  . ibuprofen (ADVIL,MOTRIN) 200 MG tablet Take 200 mg by mouth every 6 (six) hours as needed.      . lidocaine-prilocaine (EMLA) cream       . LORazepam (ATIVAN) 0.5 MG tablet Take 1 tablet (0.5 mg total) by mouth every 8 (eight) hours as needed for anxiety.  60 tablet  3  . omeprazole (PRILOSEC) 20 MG capsule Take 20 mg by mouth daily.      . ondansetron (ZOFRAN) 8 MG tablet       . ranitidine (ZANTAC) 150 MG tablet Take 150 mg by mouth 2 (two) times daily.      . temazepam (RESTORIL) 15 MG capsule Take 1 capsule (15 mg total) by mouth at bedtime as needed for sleep.  30 capsule  1  . zolpidem (AMBIEN) 5 MG tablet Take 5 mg by mouth at bedtime as needed.      . ciprofloxacin (CIPRO) 500 MG tablet Take 1  tablet (500 mg total) by mouth 2 (two) times daily.  14 tablet  3  . fluconazole (DIFLUCAN) 100 MG tablet Take 1 tablet (100 mg total) by mouth daily.  7 tablet  3  . prochlorperazine (COMPAZINE) 10 MG tablet Take 1 tablet (10 mg total) by mouth every 6 (six) hours as needed.  30 tablet  3    OBJECTIVE: Filed Vitals:   03/25/12 1302  BP: 102/73  Pulse: 111  Temp: 99.4 F (37.4 C)     Body mass index is 25.83 kg/(m^2).    ECOG FS: 1  Physical Exam: HEENT:  Sclerae anicteric, conjunctivae pink.  Oropharynx  notable for a white coating on the time and buccal  mucosa, no mucositis or ulcerations.   Nodes:  No cervical, supraclavicular, or axillary lymphadenopathy palpated.  Breast Exam:   deferred.  Lungs:  Clear to auscultation bilaterally.  No crackles, rhonchi, or wheezes.   Heart:  Regular rate and rhythm, mildly tachycardic.   Abdomen:  Soft, nontender.  Positive bowel sounds.  No organomegaly or masses palpated.   Musculoskeletal:  No focal spinal tenderness to palpation.  Extremities:  Benign.  No peripheral edema or cyanosis.   Skin:  Benign.   Neuro:  Nonfocal.    LAB RESULTS: Lab Results  Component Value Date   WBC 1.2* 03/25/2012   NEUTROABS 0.0* 03/25/2012   HGB 13.5 03/25/2012   HCT 38.7 03/25/2012   MCV 89.5 03/25/2012   PLT 174 03/25/2012      Chemistry      Component Value Date/Time   NA 134* 03/25/2012 1248   K 3.8 03/25/2012 1248   CL 97 03/25/2012 1248   CO2 28 03/25/2012 1248   BUN 8 03/25/2012 1248   CREATININE 0.77 03/25/2012 1248      Component Value Date/Time   CALCIUM 8.9 03/25/2012 1248   ALKPHOS 58 03/25/2012 1248   AST 22 03/25/2012 1248   ALT 36* 03/25/2012 1248   BILITOT 1.2 03/25/2012 1248       Lab Results  Component Value Date   LABCA2 23 02/20/2011     STUDIES: Ct Chest W Contrast  02/26/2012  *RADIOLOGY REPORT*  Clinical Data: History of breast cancer.  CT CHEST WITH CONTRAST  Technique:  Multidetector CT imaging of the chest was performed  following the standard protocol during bolus administration of intravenous contrast.  Contrast: 80mL OMNIPAQUE IOHEXOL 300 MG/ML IJ SOLN  Comparison: No priors.  Findings:  Mediastinum: The Heart size is normal. There is no significant pericardial fluid, thickening or pericardial calcification. No pathologically enlarged mediastinal, hilar or bilateral internal mammary lymph nodes. Esophagus is unremarkable in appearance.  Lungs/Pleura: A 6 mm subpleural nodule the periphery of the right lower lobe (image 29 of series 5).  No other definite suspicious- appearing pulmonary nodules or masses are otherwise identified.  No consolidative airspace disease.  No pleural effusions.  A small focus of peripheral linear ground-glass attenuation in the posterior aspect of the right upper lobe likely reflects some scarring.  Upper Abdomen: Unremarkable.  Musculoskeletal: The patient is status post bilateral subpectoral breast implants.  Post lumpectomy changes are noted in the anterior aspect of the left breast.  Anterior and inferior to the right- sided implant there is a large 8.9 x 4.4 cm low attenuation collection that may represent a postoperative seroma.  No definite axillary lymphadenopathy. There are no aggressive appearing lytic or blastic lesions noted in the visualized portions of the skeleton.  IMPRESSION: 1.  No definite findings to suggest metastatic disease in the thorax. There is a 6 mm pulmonary nodule in the posterior aspect of the right lower lobe, which is subpleural in location and nonspecific.  Statistically, this is likely to represent a subpleural lymph node, however, continued attention on future follow up studies is recommended.  At this time, a 51-month follow- up CT scan would be recommended.   2.  Probable postoperative seroma (per chart review, the patient underwent right-sided lumpectomy on the 02/05/2012) in the anterior/inferior aspect of the right breast, as above. 3.  Status post left lumpectomy  and bilateral breast augmentation with subpectoral implants.  Original Report Authenticated By: Florencia Reasons, M.D.    ASSESSMENT: 46 year old  BRCA 1-2 negative [place] woman  (1) status post left lumpectomy and sentinel lymph node biopsy December 2002 for a 1.9 cm invasive ductal carcinoma, no lymph node involvement, triple negative, treated with 4 cycles of Cytoxan and Adriamycin followed by radiation, with no evidence of disease recurrence to date.  (2) status post Right lumpectomy and axillary lymph node dissection 03/02/2012 for a T2 N2 invasive lobular breast cancer, grade 1, estrogen receptor 85% and progesterone receptor 98% positive, with HER-2 nonamplified and an MIB-1 of 9%; 10 of 10 lymph nodes involved. (3)  receiving adjuvant chemotherapy consisting of 4 every 3 week doses of docetaxel/cyclophosphamide, with Neulasta on day 2 for granulocyte support.  (4) afebrile chemotherapy-induced neutropenia  PLAN: This case was reviewed with Dr. Darnelle Catalan, and we will start the patient on Cipro, 500 mg by mouth twice a day for the next 7 days. I spent quite a bit of time today reviewing neutropenic precautions, and the patient understands to call us with any temps of 100 or above. I have also prescribed Diflucan for oral thrush, 100 mg daily for 7 days.  Dr. Darnelle Catalan had ordered a PET scan for the patient, but for some reason this was never scheduled. I will have our schedulers check into this today. I will plan on seeing the patient back in 2 weeks for reevaluation on April 25, prior to her second dose of chemotherapy. We will likely have her come in next week, however, for labs only simply to follow her neutrophil count. She agrees with giving Neulasta with future cycles, and these have been scheduled accordingly.  Patient voices understanding and agreement with our plan, and will call with any changes or problems.   Emory Gallentine    03/25/2012

## 2012-03-29 ENCOUNTER — Telehealth: Payer: Self-pay | Admitting: *Deleted

## 2012-03-29 ENCOUNTER — Telehealth: Payer: Self-pay | Admitting: Oncology

## 2012-03-29 NOTE — Telephone Encounter (Signed)
patient called and changed her appointment time to 4:00 pm

## 2012-03-29 NOTE — Telephone Encounter (Signed)
lmonvm adviisng the pt of her lab appt on 03/31/2012

## 2012-03-31 ENCOUNTER — Other Ambulatory Visit: Payer: 59 | Admitting: Lab

## 2012-04-06 ENCOUNTER — Other Ambulatory Visit: Payer: Self-pay | Admitting: Oncology

## 2012-04-08 ENCOUNTER — Other Ambulatory Visit: Payer: 59 | Admitting: Lab

## 2012-04-08 ENCOUNTER — Encounter: Payer: Self-pay | Admitting: Physician Assistant

## 2012-04-08 ENCOUNTER — Ambulatory Visit (INDEPENDENT_AMBULATORY_CARE_PROVIDER_SITE_OTHER): Payer: 59 | Admitting: Surgery

## 2012-04-08 ENCOUNTER — Telehealth: Payer: Self-pay | Admitting: Oncology

## 2012-04-08 ENCOUNTER — Other Ambulatory Visit (HOSPITAL_BASED_OUTPATIENT_CLINIC_OR_DEPARTMENT_OTHER): Payer: 59 | Admitting: Lab

## 2012-04-08 ENCOUNTER — Ambulatory Visit (HOSPITAL_BASED_OUTPATIENT_CLINIC_OR_DEPARTMENT_OTHER): Payer: 59 | Admitting: Physician Assistant

## 2012-04-08 ENCOUNTER — Ambulatory Visit (HOSPITAL_BASED_OUTPATIENT_CLINIC_OR_DEPARTMENT_OTHER): Payer: 59

## 2012-04-08 VITALS — BP 112/75 | HR 85 | Temp 98.3°F | Ht 65.0 in | Wt 158.5 lb

## 2012-04-08 DIAGNOSIS — Z5111 Encounter for antineoplastic chemotherapy: Secondary | ICD-10-CM

## 2012-04-08 DIAGNOSIS — Z853 Personal history of malignant neoplasm of breast: Secondary | ICD-10-CM

## 2012-04-08 DIAGNOSIS — C50919 Malignant neoplasm of unspecified site of unspecified female breast: Secondary | ICD-10-CM

## 2012-04-08 DIAGNOSIS — Z170 Estrogen receptor positive status [ER+]: Secondary | ICD-10-CM

## 2012-04-08 DIAGNOSIS — F411 Generalized anxiety disorder: Secondary | ICD-10-CM

## 2012-04-08 LAB — CBC WITH DIFFERENTIAL/PLATELET
Eosinophils Absolute: 0 10*3/uL (ref 0.0–0.5)
HCT: 39.1 % (ref 34.8–46.6)
LYMPH%: 6.7 % — ABNORMAL LOW (ref 14.0–49.7)
MCV: 88.7 fL (ref 79.5–101.0)
MONO#: 0.6 10*3/uL (ref 0.1–0.9)
MONO%: 3 % (ref 0.0–14.0)
NEUT#: 16.5 10*3/uL — ABNORMAL HIGH (ref 1.5–6.5)
NEUT%: 90.2 % — ABNORMAL HIGH (ref 38.4–76.8)
Platelets: 266 10*3/uL (ref 145–400)
RBC: 4.41 10*6/uL (ref 3.70–5.45)
WBC: 18.3 10*3/uL — ABNORMAL HIGH (ref 3.9–10.3)
nRBC: 0 % (ref 0–0)

## 2012-04-08 MED ORDER — DEXAMETHASONE SODIUM PHOSPHATE 4 MG/ML IJ SOLN
20.0000 mg | Freq: Once | INTRAMUSCULAR | Status: AC
  Administered 2012-04-08: 20 mg via INTRAVENOUS

## 2012-04-08 MED ORDER — SODIUM CHLORIDE 0.9 % IV SOLN
600.0000 mg/m2 | Freq: Once | INTRAVENOUS | Status: AC
  Administered 2012-04-08: 1100 mg via INTRAVENOUS
  Filled 2012-04-08: qty 55

## 2012-04-08 MED ORDER — SODIUM CHLORIDE 0.9 % IV SOLN
Freq: Once | INTRAVENOUS | Status: AC
  Administered 2012-04-08: 15:00:00 via INTRAVENOUS

## 2012-04-08 MED ORDER — ONDANSETRON 16 MG/50ML IVPB (CHCC)
16.0000 mg | Freq: Once | INTRAVENOUS | Status: AC
  Administered 2012-04-08: 16 mg via INTRAVENOUS

## 2012-04-08 MED ORDER — DOCETAXEL CHEMO INJECTION 160 MG/16ML
75.0000 mg/m2 | Freq: Once | INTRAVENOUS | Status: AC
  Administered 2012-04-08: 140 mg via INTRAVENOUS
  Filled 2012-04-08: qty 14

## 2012-04-08 NOTE — Progress Notes (Signed)
ID: KAYDIE PETSCH   DOB: 1966/09/05  MR#: [id number]  JWJ#:[id number]  HISTORY OF PRESENT ILLNESS: Christina Blackwell is a 46 year old [place] woman formerly followed here for a left-sided stage I invasive ductal carcinoma, which was treated with adjuvant chemotherapy completed in 2003. She was released from followup last year.  On 12/28/2011 she had bilateral screening mammography showing a possible area of distortion in the right breast. She was recalled for additional views 01/09/2012 and this confirmed an area of architectural distortion in the lateral aspect of the right breast measuring 2.9 cm. This was not palpable by exam. Biopsy was performed the same day and showed ([id number]) and invasive lobular breast cancer, e-cadherin negative, strongly estrogen receptor positive at 85%, progesterone receptor positive at 98%, with an MIB-1 of 9%, and no HER-2 amplification.  Patient is status post right lumpectomy and x-ray lymph node dissection 03/02/2012 for a T2 N2 invasive lobular breast carcinoma, grade 1. 10 of 10 lymph nodes were positive. Margins were close but negative.  The decision was made to proceed with adjuvant chemotherapy consisting of 4 q. three-week doses of docetaxel/cyclophosphamide with Neulasta on day 2 for granulocyte support.    INTERVAL HISTORY: Christina Blackwell returns today for followup on day 1 cycle 2 of 4 planned q. three-week doses of docetaxel/cyclophosphamide being given for her right breast carcinoma. She has recovered well from cycle one, and is ready to proceed with treatment today, followed by Neulasta injection tomorrow.  Interval history is remarkable for any Christina Blackwell having a spider bite over the weekend. She tells me it was a black widowed. She was seen in the emergency room. She had some pain in her left foot, some leg cramps, and abdominal cramping. All of these symptoms, however, resolved over approximately 12 hours. She's had no additional cramps or pain.  Christina Blackwell is still  emotional, and tearful on presentation. Over half of our 45 minute appointment today was spent discussing and she is concerns and anxieties, counseling her regarding her prognosis, and coordinating her care. She did attend one of the support groups here at the cancer Center which she found somewhat helpful.  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 I encouraged her to use one of these creative outlets to help her cope with her anxiety and deal with her stress. Also encouraged her to exercise when she felt "stressed out".  We again discussed antidepressants, and she continues to decline. She does take lorazepam on occasion, usually when she is coming in for an appointment here. Although she admits to having both depression and anxiety, she denies suicidal ideations.  REVIEW OF SYSTEMS:  Christina Blackwell completed a course of antibiotics last week for a febrile chemotherapy-induced neutropenia. She's had no fevers, chills, or night sweats. No nausea and no change in bowel habits. No cough, shortness of breath, or chest pain. No abnormal headaches or dizziness. No change in vision or excessive tearing. No peripheral neuropathy. No peripheral swelling. No skin changes, rashes, abnormal bleeding.   A detailed review of systems is otherwise noncontributory.   PAST MEDICAL HISTORY: Past Medical History  Diagnosis Date  . History of bilateral breast implants   . GERD (gastroesophageal reflux disease)   . Complication of anesthesia   . PONV (postoperative nausea and vomiting)   . History of radiation therapy 03/30/02-05/16/02    left breast   . History of chemotherapy     cyclosphosphamide/ doxorubicin  . Breast cancer 2002    left   . Breast cancer  2012    right  . Anxiety   . Depression   . Glaucoma     PAST SURGICAL HISTORY: Past Surgical History  Procedure Date  . Portacath placement   . Portacath removal   . Tonsillectomy and adenoidectomy   . Breast lumpectomy 11/2001    left  .  Sentinel lymph node biopsy 11/2001    left axillary  . Breast enhancement surgery 2003    bilateral  . Breast lumpectomy w/ needle localization 02/05/2012    right/ slnbx  . Mirena iud   . Axillary lymph node dissection 03/02/2012    Procedure: AXILLARY LYMPH NODE DISSECTION;  Surgeon: Kandis Cocking, MD;  Location: [facility];  Service: General;  Laterality: Right;    FAMILY HISTORY Family History  Problem Relation Age of Onset  . Cancer Mother     breast  . Cancer Paternal Aunt     breast  . Cancer Cousin     breast ca 50's 1st cousin    Gynecologic history:  Menarche age 20, she is GX P0. She is currently using a Mirena implant   Social history:  She works for [employer], but her job was recently eliminated, and she is temporarily employed by them at present. She is divorced and lives by herself, with 2 birds, and [place] parrot and a cockateal. 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   ADVANCED DIRECTIVES:  HEALTH MAINTENANCE: History  Substance Use Topics  . Smoking status: Never Smoker   . Smokeless tobacco: Not on file  . Alcohol Use: Yes     social occasio0nally 1-2      Colonoscopy:  PAP:  Bone density:  Lipid panel:  No Known Allergies  Current Outpatient Prescriptions  Medication Sig Dispense Refill  . ciprofloxacin (CIPRO) 500 MG tablet       . dexamethasone (DECADRON) 4 MG tablet       . fluconazole (DIFLUCAN) 100 MG tablet       . cetirizine (ZYRTEC) 10 MG tablet Take 10 mg by mouth as needed.      . clonazePAM (KLONOPIN) 0.5 MG tablet Take 0.5 mg by mouth 2 (two) times daily as needed. Per patient stated 02/23/12      . ibuprofen (ADVIL,MOTRIN) 200 MG tablet Take 200 mg by mouth every 6 (six) hours as needed.      . lidocaine-prilocaine (EMLA) cream       . LORazepam (ATIVAN) 0.5 MG tablet Take 1 tablet (0.5 mg total) by mouth every 8 (eight) hours as needed for anxiety.  60 tablet  3  . omeprazole (PRILOSEC) 20 MG capsule Take 20 mg  by mouth daily.      . ondansetron (ZOFRAN) 8 MG tablet       . prochlorperazine (COMPAZINE) 10 MG tablet Take 1 tablet (10 mg total) by mouth every 6 (six) hours as needed.  30 tablet  3  . ranitidine (ZANTAC) 150 MG tablet Take 150 mg by mouth 2 (two) times daily.      . temazepam (RESTORIL) 15 MG capsule Take 1 capsule (15 mg total) by mouth at bedtime as needed for sleep.  30 capsule  1  . zolpidem (AMBIEN) 5 MG tablet Take 5 mg by mouth at bedtime as needed.       No current facility-administered medications for this visit.   Facility-Administered Medications Ordered in Other Visits  Medication Dose Route Frequency Provider Last Rate Last Dose  . 0.9 %  sodium chloride infusion   Intravenous Once Catalina Gravel, PA 20 mL/hr at 04/08/12 1525    . cyclophosphamide (CYTOXAN) 1,100 mg in sodium chloride 0.9 % 250 mL chemo infusion  600 mg/m2 (Treatment Plan Actual) Intravenous Once Erla Bacchi Allegra Grana, PA      . dexamethasone (DECADRON) injection 20 mg  20 mg Intravenous Once Catalina Gravel, PA   20 mg at 04/08/12 1534  . DOCEtaxel (TAXOTERE) 140 mg in dextrose 5 % 250 mL chemo infusion  75 mg/m2 (Treatment Plan Actual) Intravenous Once Catalina Gravel, PA 264 mL/hr at 04/08/12 1608 140 mg at 04/08/12 1608  . ondansetron (ZOFRAN) IVPB 16 mg  16 mg Intravenous Once Catalina Gravel, PA   16 mg at 04/08/12 1532    OBJECTIVE: Filed Vitals:   04/08/12 1417  BP: 112/75  Pulse: 85  Temp: 98.3 F (36.8 C)     Body mass index is 26.38 kg/(m^2).    ECOG FS: 1  Physical Exam: HEENT:  Sclerae anicteric, conjunctivae pink.  Oropharynx  benign, no mucositis or ulcerations.   Nodes:  No cervical, supraclavicular, or axillary lymphadenopathy palpated.  Breast Exam:   deferred.  Lungs:  Clear to auscultation bilaterally.  No crackles, rhonchi, or wheezes.   Heart:  Regular rate and rhythm, mildly tachycardic.   Abdomen:  Soft, nontender.  Positive bowel sounds.  No organomegaly or masses palpated.   Musculoskeletal:  No  focal spinal tenderness to palpation.  Extremities:  Benign.  No peripheral edema or cyanosis.   Skin:  Benign.   Neuro:  Nonfocal.    LAB RESULTS: Lab Results  Component Value Date   WBC 18.3* 04/08/2012   NEUTROABS 16.5* 04/08/2012   HGB 13.3 04/08/2012   HCT 39.1 04/08/2012   MCV 88.7 04/08/2012   PLT 266 04/08/2012      Chemistry      Component Value Date/Time   NA 134* 03/25/2012 1248   K 3.8 03/25/2012 1248   CL 97 03/25/2012 1248   CO2 28 03/25/2012 1248   BUN 8 03/25/2012 1248   CREATININE 0.77 03/25/2012 1248      Component Value Date/Time   CALCIUM 8.9 03/25/2012 1248   ALKPHOS 58 03/25/2012 1248   AST 22 03/25/2012 1248   ALT 36* 03/25/2012 1248   BILITOT 1.2 03/25/2012 1248       Lab Results  Component Value Date   LABCA2 23 02/20/2011     STUDIES: Ct Chest W Contrast  02/26/2012  *RADIOLOGY REPORT*  Clinical Data: History of breast cancer.  CT CHEST WITH CONTRAST  Technique:  Multidetector CT imaging of the chest was performed following the standard protocol during bolus administration of intravenous contrast.  Contrast: 80mL OMNIPAQUE IOHEXOL 300 MG/ML IJ SOLN  Comparison: No priors.  Findings:  Mediastinum: The Heart size is normal. There is no significant pericardial fluid, thickening or pericardial calcification. No pathologically enlarged mediastinal, hilar or bilateral internal mammary lymph nodes. Esophagus is unremarkable in appearance.  Lungs/Pleura: A 6 mm subpleural nodule the periphery of the right lower lobe (image 29 of series 5).  No other definite suspicious- appearing pulmonary nodules or masses are otherwise identified.  No consolidative airspace disease.  No pleural effusions.  A small focus of peripheral linear ground-glass attenuation in the posterior aspect of the right upper lobe likely reflects some scarring.  Upper Abdomen: Unremarkable.  Musculoskeletal: The patient is status post bilateral subpectoral breast implants.  Post lumpectomy changes are  noted in  the anterior aspect of the left breast.  Anterior and inferior to the right- sided implant there is a large 8.9 x 4.4 cm low attenuation collection that may represent a postoperative seroma.  No definite axillary lymphadenopathy. There are no aggressive appearing lytic or blastic lesions noted in the visualized portions of the skeleton.  IMPRESSION: 1.  No definite findings to suggest metastatic disease in the thorax. There is a 6 mm pulmonary nodule in the posterior aspect of the right lower lobe, which is subpleural in location and nonspecific.  Statistically, this is likely to represent a subpleural lymph node, however, continued attention on future follow up studies is recommended.  At this time, a 71-month follow- up CT scan would be recommended.   2.  Probable postoperative seroma (per chart review, the patient underwent right-sided lumpectomy on the 02/05/2012) in the anterior/inferior aspect of the right breast, as above. 3.  Status post left lumpectomy and bilateral breast augmentation with subpectoral implants.  Original Report Authenticated By: Florencia Reasons, M.D.    ASSESSMENT: 46 year old BRCA 1-2 negative [place] woman  (1) status post left lumpectomy and sentinel lymph node biopsy December 2002 for a 1.9 cm invasive ductal carcinoma, no lymph node involvement, triple negative, treated with 4 cycles of Cytoxan and Adriamycin followed by radiation, with no evidence of disease recurrence to date.  (2) status post Right lumpectomy and axillary lymph node dissection 03/02/2012 for a T2 N2 invasive lobular breast cancer, grade 1, estrogen receptor 85% and progesterone receptor 98% positive, with HER-2 nonamplified and an MIB-1 of 9%; 10 of 10 lymph nodes involved. (3)  receiving adjuvant chemotherapy consisting of 4 every 3 week doses of docetaxel/cyclophosphamide, with Neulasta on day 2 for granulocyte support.  (4) history of afebrile chemotherapy-induced  neutropenia  PLAN: Christina Blackwell will proceed to treatment today as scheduled for her second dose of chemotherapy. She will receive Neulasta tomorrow. I will see her next week for followup on May 2. I encouraged her to use the lorazepam at home when she fills especially anxious. Again we discussed other coping mechanisms as discussed above.   Dr. Darnelle Catalan had ordered a PET scan for the patient, but for some reason this was never scheduled. I will have our schedulers check into this again, and it will be scheduled as soon as possible.  Patient voices understanding and agreement with our plan, and will call with any changes or problems.   Aleia Larocca    04/08/2012

## 2012-04-08 NOTE — Telephone Encounter (Signed)
gve the pt her may,june 2013 appt calendar along with the pet scan appt

## 2012-04-09 ENCOUNTER — Encounter: Payer: Self-pay | Admitting: *Deleted

## 2012-04-09 ENCOUNTER — Ambulatory Visit: Payer: 59

## 2012-04-09 NOTE — Progress Notes (Signed)
Pt has N/S for neulasta injection. Pt has been contacted. Per pt, she does not like the "way the neulasta makes me feel" . At this time pt is refusing neulasta but will call this desk before the end of the day if she reconsiders.

## 2012-04-15 ENCOUNTER — Other Ambulatory Visit (HOSPITAL_BASED_OUTPATIENT_CLINIC_OR_DEPARTMENT_OTHER): Payer: 59 | Admitting: Lab

## 2012-04-15 ENCOUNTER — Ambulatory Visit (HOSPITAL_BASED_OUTPATIENT_CLINIC_OR_DEPARTMENT_OTHER): Payer: 59 | Admitting: Physician Assistant

## 2012-04-15 ENCOUNTER — Telehealth: Payer: Self-pay | Admitting: Oncology

## 2012-04-15 ENCOUNTER — Encounter: Payer: Self-pay | Admitting: Physician Assistant

## 2012-04-15 VITALS — BP 127/76 | HR 93 | Temp 98.6°F | Ht 65.0 in | Wt 154.6 lb

## 2012-04-15 DIAGNOSIS — C50919 Malignant neoplasm of unspecified site of unspecified female breast: Secondary | ICD-10-CM

## 2012-04-15 DIAGNOSIS — C773 Secondary and unspecified malignant neoplasm of axilla and upper limb lymph nodes: Secondary | ICD-10-CM

## 2012-04-15 DIAGNOSIS — Z853 Personal history of malignant neoplasm of breast: Secondary | ICD-10-CM

## 2012-04-15 DIAGNOSIS — D702 Other drug-induced agranulocytosis: Secondary | ICD-10-CM

## 2012-04-15 LAB — COMPREHENSIVE METABOLIC PANEL
Albumin: 3.3 g/dL — ABNORMAL LOW (ref 3.5–5.2)
BUN: 11 mg/dL (ref 6–23)
Calcium: 9.1 mg/dL (ref 8.4–10.5)
Chloride: 102 mEq/L (ref 96–112)
Glucose, Bld: 133 mg/dL — ABNORMAL HIGH (ref 70–99)
Potassium: 3.5 mEq/L (ref 3.5–5.3)

## 2012-04-15 LAB — CBC WITH DIFFERENTIAL/PLATELET
Basophils Absolute: 0 10*3/uL (ref 0.0–0.1)
Eosinophils Absolute: 0 10*3/uL (ref 0.0–0.5)
HGB: 12.9 g/dL (ref 11.6–15.9)
NEUT#: 0.1 10*3/uL — CL (ref 1.5–6.5)
RDW: 13.4 % (ref 11.2–14.5)
lymph#: 0.8 10*3/uL — ABNORMAL LOW (ref 0.9–3.3)

## 2012-04-15 NOTE — Progress Notes (Signed)
ID: Christina Blackwell   DOB: December 20, 1965  MR#: [id number]  WUJ#:[id number]  HISTORY OF PRESENT ILLNESS: Christina Blackwell is a 46 year old [place] woman formerly followed here for a left-sided stage I invasive ductal carcinoma, which was treated with adjuvant chemotherapy completed in 2003. She was released from followup last year.  On 12/28/2011 she had bilateral screening mammography showing a possible area of distortion in the right breast. She was recalled for additional views 01/09/2012 and this confirmed an area of architectural distortion in the lateral aspect of the right breast measuring 2.9 cm. This was not palpable by exam. Biopsy was performed the same day and showed ([id number]) and invasive lobular breast cancer, e-cadherin negative, strongly estrogen receptor positive at 85%, progesterone receptor positive at 98%, with an MIB-1 of 9%, and no HER-2 amplification.  Patient is status post right lumpectomy and x-ray lymph node dissection 03/02/2012 for a T2 N2 invasive lobular breast carcinoma, grade 1. 10 of 10 lymph nodes were positive. Margins were close but negative.  The decision was made to proceed with adjuvant chemotherapy consisting of 4 q. three-week doses of docetaxel/cyclophosphamide with Neulasta on day 2 for granulocyte support.    INTERVAL HISTORY: Christina Blackwell returns today for assessment chemotoxicity on day 8 cycle 2 of 4 planned q. three-week doses of docetaxel/cyclophosphamide being given for her right breast carcinoma. She was scheduled for Neulasta on day 2, but declined the injection.  Christina Blackwell seems to be less emotional and tearful on presentation today, and in fact is much more "upbeat". She admits that she decided against the Neulasta injection on day 2, and she again has afebrile chemotherapy-induced neutropenia with an ANC today is 0.1. Fortunately, she's had no fevers, chills, or night sweats. In fact, other than her fatigue, and some mild taste aversion, she has no additional concerns  today.  She is a little anxious about the upcoming PET scan which is scheduled for tomorrow.  REVIEW OF SYSTEMS:  Christina Blackwell continues to deny any signs of peripheral neuropathy. She's had no peripheral swelling. No rashes or abnormal bleeding. No nausea or change in bowel habits. She has had a little bit of sinus congestion and a cough productive of clear phlegm. She denies any pleurisy or shortness of breath. No abnormal headaches or dizziness, no change in vision or tearing.  A detailed review of systems is otherwise noncontributory.  PAST MEDICAL HISTORY: Past Medical History  Diagnosis Date  . History of bilateral breast implants   . GERD (gastroesophageal reflux disease)   . Complication of anesthesia   . PONV (postoperative nausea and vomiting)   . History of radiation therapy 03/30/02-05/16/02    left breast   . History of chemotherapy     cyclosphosphamide/ doxorubicin  . Breast cancer 2002    left   . Breast cancer 2012    right  . Anxiety   . Depression   . Glaucoma     PAST SURGICAL HISTORY: Past Surgical History  Procedure Date  . Portacath placement   . Portacath removal   . Tonsillectomy and adenoidectomy   . Breast lumpectomy 11/2001    left  . Sentinel lymph node biopsy 11/2001    left axillary  . Breast enhancement surgery 2003    bilateral  . Breast lumpectomy w/ needle localization 02/05/2012    right/ slnbx  . Mirena iud   . Axillary lymph node dissection 03/02/2012    Procedure: AXILLARY LYMPH NODE DISSECTION;  Surgeon: Kandis Cocking, MD;  Location: [facility];  Service: General;  Laterality: Right;    FAMILY HISTORY Family History  Problem Relation Age of Onset  . Cancer Mother     breast  . Cancer Paternal Aunt     breast  . Cancer Cousin     breast ca 47's 1st cousin    Gynecologic history:  Menarche age 3, she is GX P0. She is currently using a Mirena implant   Social history:  She works for [employer], but  her job was recently eliminated, and she is temporarily employed by them at present. She is divorced and lives by herself, with 2 birds, and [place] parrot and a cockateal. 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   ADVANCED DIRECTIVES:  HEALTH MAINTENANCE: History  Substance Use Topics  . Smoking status: Never Smoker   . Smokeless tobacco: Not on file  . Alcohol Use: Yes     social occasio0nally 1-2      Colonoscopy:  PAP:  Bone density:  Lipid panel:  No Known Allergies  Current Outpatient Prescriptions  Medication Sig Dispense Refill  . cetirizine (ZYRTEC) 10 MG tablet Take 10 mg by mouth as needed.      . ciprofloxacin (CIPRO) 500 MG tablet       . clonazePAM (KLONOPIN) 0.5 MG tablet Take 0.5 mg by mouth 2 (two) times daily as needed. Per patient stated 02/23/12      . dexamethasone (DECADRON) 4 MG tablet       . fluconazole (DIFLUCAN) 100 MG tablet       . ibuprofen (ADVIL,MOTRIN) 200 MG tablet Take 200 mg by mouth every 6 (six) hours as needed.      . lidocaine-prilocaine (EMLA) cream       . LORazepam (ATIVAN) 0.5 MG tablet Take 1 tablet (0.5 mg total) by mouth every 8 (eight) hours as needed for anxiety.  60 tablet  3  . omeprazole (PRILOSEC) 20 MG capsule Take 20 mg by mouth daily.      . ondansetron (ZOFRAN) 8 MG tablet       . prochlorperazine (COMPAZINE) 10 MG tablet Take 1 tablet (10 mg total) by mouth every 6 (six) hours as needed.  30 tablet  3  . ranitidine (ZANTAC) 150 MG tablet Take 150 mg by mouth 2 (two) times daily.      . temazepam (RESTORIL) 15 MG capsule Take 1 capsule (15 mg total) by mouth at bedtime as needed for sleep.  30 capsule  1  . zolpidem (AMBIEN) 5 MG tablet Take 5 mg by mouth at bedtime as needed.        OBJECTIVE: Filed Vitals:   04/15/12 1424  BP: 127/76  Pulse: 93  Temp: 98.6 F (37 C)     Body mass index is 25.73 kg/(m^2).    ECOG FS: 1  Physical Exam: HEENT:  Sclerae anicteric, conjunctivae pink.  Oropharynx  benign, no mucositis or  ulcerations.   Nodes:  No cervical, supraclavicular, or axillary lymphadenopathy palpated.  Breast Exam:   deferred.  Lungs:  Clear to auscultation bilaterally.  No crackles, rhonchi, or wheezes.   Heart:  Regular rate and rhythm.   Abdomen:  Soft, nontender.  Positive bowel sounds.  No organomegaly or masses palpated.   Musculoskeletal:  No focal spinal tenderness to palpation.  Extremities:  Benign.  No peripheral edema or cyanosis.   Skin:  Benign.   Neuro:  Nonfocal. Alert and oriented x3.    LAB RESULTS: Lab Results  Component  Value Date   WBC 1.0* 04/15/2012   NEUTROABS 0.1* 04/15/2012   HGB 12.9 04/15/2012   HCT 36.5 04/15/2012   MCV 88.2 04/15/2012   PLT 213 04/15/2012      Chemistry      Component Value Date/Time   NA 138 04/15/2012 1346   K 3.5 04/15/2012 1346   CL 102 04/15/2012 1346   CO2 27 04/15/2012 1346   BUN 11 04/15/2012 1346   CREATININE 0.64 04/15/2012 1346      Component Value Date/Time   CALCIUM 9.1 04/15/2012 1346   ALKPHOS 60 04/15/2012 1346   AST 14 04/15/2012 1346   ALT 21 04/15/2012 1346   BILITOT 0.4 04/15/2012 1346       Lab Results  Component Value Date   LABCA2 23 02/20/2011     STUDIES: Ct Chest W Contrast  02/26/2012  *RADIOLOGY REPORT*  Clinical Data: History of breast cancer.  CT CHEST WITH CONTRAST  Technique:  Multidetector CT imaging of the chest was performed following the standard protocol during bolus administration of intravenous contrast.  Contrast: 80mL OMNIPAQUE IOHEXOL 300 MG/ML IJ SOLN  Comparison: No priors.  Findings:  Mediastinum: The Heart size is normal. There is no significant pericardial fluid, thickening or pericardial calcification. No pathologically enlarged mediastinal, hilar or bilateral internal mammary lymph nodes. Esophagus is unremarkable in appearance.  Lungs/Pleura: A 6 mm subpleural nodule the periphery of the right lower lobe (image 29 of series 5).  No other definite suspicious- appearing pulmonary nodules or masses are otherwise  identified.  No consolidative airspace disease.  No pleural effusions.  A small focus of peripheral linear ground-glass attenuation in the posterior aspect of the right upper lobe likely reflects some scarring.  Upper Abdomen: Unremarkable.  Musculoskeletal: The patient is status post bilateral subpectoral breast implants.  Post lumpectomy changes are noted in the anterior aspect of the left breast.  Anterior and inferior to the right- sided implant there is a large 8.9 x 4.4 cm low attenuation collection that may represent a postoperative seroma.  No definite axillary lymphadenopathy. There are no aggressive appearing lytic or blastic lesions noted in the visualized portions of the skeleton.  IMPRESSION: 1.  No definite findings to suggest metastatic disease in the thorax. There is a 6 mm pulmonary nodule in the posterior aspect of the right lower lobe, which is subpleural in location and nonspecific.  Statistically, this is likely to represent a subpleural lymph node, however, continued attention on future follow up studies is recommended.  At this time, a 43-month follow- up CT scan would be recommended.   2.  Probable postoperative seroma (per chart review, the patient underwent right-sided lumpectomy on the 02/05/2012) in the anterior/inferior aspect of the right breast, as above. 3.  Status post left lumpectomy and bilateral breast augmentation with subpectoral implants.  Original Report Authenticated By: Florencia Reasons, M.D.    ASSESSMENT: 46 year old BRCA 1-2 negative [place] woman  (1) status post left lumpectomy and sentinel lymph node biopsy December 2002 for a 1.9 cm invasive ductal carcinoma, no lymph node involvement, triple negative, treated with 4 cycles of Cytoxan and Adriamycin followed by radiation, with no evidence of disease recurrence to date.  (2) status post Right lumpectomy and axillary lymph node dissection 03/02/2012 for a T2 N2 invasive lobular breast cancer, grade 1, estrogen  receptor 85% and progesterone receptor 98% positive, with HER-2 nonamplified and an MIB-1 of 9%; 10 of 10 lymph nodes involved. (3)  receiving adjuvant chemotherapy  consisting of 4 every 3 week doses of docetaxel/cyclophosphamide, with Neulasta on day 2 for granulocyte support.  (4) history of afebrile chemotherapy-induced neutropenia  PLAN: Christina Blackwell continues to tolerate treatment well. She will start on Cipro prophylactically at 500 mg by mouth twice a day for the next 7 days due to her afebrile neutropenia. She has agreed, however, to proceed with a Neulasta injection with cycle 3. We again reviewed neutropenic cautions, and she knows to call with any fevers of 100 or above.  And she will have her PET scan as scheduled tomorrow. I will see her again briefly next week for repeat labs to review those results. I will then see HER-2 weeks from now on May 16 prior to her third scheduled dose of chemotherapy.  Patient voices understanding and agreement with our plan, and will call with any changes or problems.   Rollyn Scialdone    04/15/2012

## 2012-04-15 NOTE — Telephone Encounter (Signed)
gve the pt her may 2013 appt calendar 

## 2012-04-16 ENCOUNTER — Encounter (HOSPITAL_COMMUNITY)
Admission: RE | Admit: 2012-04-16 | Discharge: 2012-04-16 | Disposition: A | Discharge status: Home / Self Care | Payer: 59 | Source: Ambulatory Visit | Attending: Physician Assistant | Admitting: Physician Assistant

## 2012-04-16 ENCOUNTER — Encounter: Payer: Self-pay | Admitting: *Deleted

## 2012-04-16 ENCOUNTER — Encounter (HOSPITAL_COMMUNITY): Payer: Self-pay

## 2012-04-16 DIAGNOSIS — C50919 Malignant neoplasm of unspecified site of unspecified female breast: Secondary | ICD-10-CM | POA: Insufficient documentation

## 2012-04-16 MED ORDER — FLUDEOXYGLUCOSE F - 18 (FDG) INJECTION
17.8000 | Freq: Once | INTRAVENOUS | Status: AC | PRN
  Administered 2012-04-16: 17.8 via INTRAVENOUS

## 2012-04-16 NOTE — Progress Notes (Signed)
RECEIVED A CALL AND FAX FROM JANE TILLEY AT [facility] CONCERNING A NUCLEAR MEDICINE PET SKULL BASE TO THIGH. THIS REPORT WAS GIVEN TO DR.MAGRINAT.

## 2012-04-16 NOTE — Progress Notes (Signed)
Per AB, have notified pt that PET is Normal

## 2012-04-19 ENCOUNTER — Other Ambulatory Visit: Payer: Self-pay | Admitting: *Deleted

## 2012-04-19 DIAGNOSIS — R309 Painful micturition, unspecified: Secondary | ICD-10-CM

## 2012-04-19 MED ORDER — NITROFURANTOIN MONOHYD MACRO 100 MG PO CAPS: 100.0000 mg | ORAL_CAPSULE | Freq: Two times a day (BID) | ORAL | Status: AC

## 2012-04-22 ENCOUNTER — Ambulatory Visit (HOSPITAL_BASED_OUTPATIENT_CLINIC_OR_DEPARTMENT_OTHER): Payer: 59 | Admitting: Physician Assistant

## 2012-04-22 ENCOUNTER — Encounter: Payer: Self-pay | Admitting: Physician Assistant

## 2012-04-22 ENCOUNTER — Other Ambulatory Visit (HOSPITAL_BASED_OUTPATIENT_CLINIC_OR_DEPARTMENT_OTHER): Payer: 59 | Admitting: Lab

## 2012-04-22 ENCOUNTER — Telehealth: Payer: Self-pay | Admitting: *Deleted

## 2012-04-22 VITALS — BP 111/74 | HR 83 | Temp 98.2°F | Ht 64.0 in | Wt 158.9 lb

## 2012-04-22 DIAGNOSIS — C50919 Malignant neoplasm of unspecified site of unspecified female breast: Secondary | ICD-10-CM

## 2012-04-22 DIAGNOSIS — I890 Lymphedema, not elsewhere classified: Secondary | ICD-10-CM

## 2012-04-22 LAB — CBC WITH DIFFERENTIAL/PLATELET
BASO%: 0.6 % (ref 0.0–2.0)
Basophils Absolute: 0 10*3/uL (ref 0.0–0.1)
HCT: 36.4 % (ref 34.8–46.6)
HGB: 12.4 g/dL (ref 11.6–15.9)
LYMPH%: 26.4 % (ref 14.0–49.7)
MCHC: 34.1 g/dL (ref 31.5–36.0)
MONO#: 0.8 10*3/uL (ref 0.1–0.9)
NEUT%: 60 % (ref 38.4–76.8)
Platelets: 180 10*3/uL (ref 145–400)
WBC: 6.4 10*3/uL (ref 3.9–10.3)

## 2012-04-22 NOTE — Telephone Encounter (Signed)
gave patient appointment for dr.manning on 05-31-2012 starting at 3:30pm faxed over the orders to [phone] the outpatient rehabilition center at [facility]

## 2012-04-22 NOTE — Progress Notes (Signed)
ID: LOURDEZ MCGAHAN   DOB: 04/08/1966  MR#: [id number]  CSN#:[id number]  HISTORY OF PRESENT ILLNESS: Suan is a 46 year old [place] woman formerly followed here for a left-sided stage I invasive ductal carcinoma, which was treated with adjuvant chemotherapy completed in 2003. She was released from followup last year.  On 12/28/2011 she had bilateral screening mammography showing a possible area of distortion in the right breast. She was recalled for additional views 01/09/2012 and this confirmed an area of architectural distortion in the lateral aspect of the right breast measuring 2.9 cm. This was not palpable by exam. Biopsy was performed the same day and showed ([id number]) and invasive lobular breast cancer, e-cadherin negative, strongly estrogen receptor positive at 85%, progesterone receptor positive at 98%, with an MIB-1 of 9%, and no HER-2 amplification.  Patient is status post right lumpectomy and x-ray lymph node dissection 03/02/2012 for a T2 N2 invasive lobular breast carcinoma, grade 1. 10 of 10 lymph nodes were positive. Margins were close but negative.  The decision was made to proceed with adjuvant chemotherapy consisting of 4 q. three-week doses of docetaxel/cyclophosphamide with Neulasta on day 2 for granulocyte support.    INTERVAL HISTORY: Karoline Caldwell returns today for assessment of chemotoxicity on day 15 cycle 2 of 4 planned q. three-week doses of docetaxel/cyclophosphamide being given for her right breast carcinoma. She was scheduled for Neulasta on day 2, but declined the injection. She has been on Cipro prophylactically now for 7 days due to a febrile chemotherapy-induced neutropenia noted on labs 04/15/2012. Fortunately, and she has had no fevers, chills, or night sweats.  Interval history is markable for [name] having had her PET scan last week on 04/16/2012. Fortunately there was no evidence of distant nodal metastasis or systemic metastasis. She is still quite anxious, and a  little tearful on presentation, although I have seen improvement in her mood since her first visit with me.  REVIEW OF SYSTEMS:  Karoline Caldwell continues to deny any signs of peripheral neuropathy. She has noted some slight swelling in the right arm, and notes that the arm is uncomfortable at times. She feels like her range of motion in the right arm and right shoulder is somewhat limited. No rashes or abnormal bleeding. No nausea or change in bowel habits. She has had a little bit of sinus congestion, runny nose, and a cough productive of clear phlegm. She denies any pleurisy or shortness of breath. No abnormal headaches or dizziness, and no change in vision or tearing.  A detailed review of systems is otherwise noncontributory.  PAST MEDICAL HISTORY: Past Medical History  Diagnosis Date  . History of bilateral breast implants   . GERD (gastroesophageal reflux disease)   . Complication of anesthesia   . PONV (postoperative nausea and vomiting)   . History of radiation therapy 03/30/02-05/16/02    left breast   . History of chemotherapy     cyclosphosphamide/ doxorubicin  . Breast cancer 2002    left   . Breast cancer 2012    right  . Anxiety   . Depression   . Glaucoma     PAST SURGICAL HISTORY: Past Surgical History  Procedure Date  . Portacath placement   . Portacath removal   . Tonsillectomy and adenoidectomy   . Breast lumpectomy 11/2001    left  . Sentinel lymph node biopsy 11/2001    left axillary  . Breast enhancement surgery 2003    bilateral  . Breast lumpectomy w/ needle localization 02/05/2012  right/ slnbx  . Mirena iud   . Axillary lymph node dissection 03/02/2012    Procedure: AXILLARY LYMPH NODE DISSECTION;  Surgeon: Kandis Cocking, MD;  Location: [facility];  Service: General;  Laterality: Right;    FAMILY HISTORY Family History  Problem Relation Age of Onset  . Cancer Mother     breast  . Cancer Paternal Aunt     breast  . Cancer Cousin      breast ca 29's 1st cousin    Gynecologic history:  Menarche age 68, she is GX P0. She is currently using a Mirena implant   Social history:  She works for [employer], but her job was recently eliminated, and she is temporarily employed by them at present. She is divorced and lives by herself, with 2 birds, and [place] parrot and a cockateal. 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   ADVANCED DIRECTIVES:  HEALTH MAINTENANCE: History  Substance Use Topics  . Smoking status: Never Smoker   . Smokeless tobacco: Not on file  . Alcohol Use: Yes     social occasio0nally 1-2      Colonoscopy:  PAP:  Bone density:  Lipid panel:  No Known Allergies  Current Outpatient Prescriptions  Medication Sig Dispense Refill  . cetirizine (ZYRTEC) 10 MG tablet Take 10 mg by mouth as needed.      . ciprofloxacin (CIPRO) 500 MG tablet       . clonazePAM (KLONOPIN) 0.5 MG tablet Take 0.5 mg by mouth 2 (two) times daily as needed. Per patient stated 02/23/12      . dexamethasone (DECADRON) 4 MG tablet       . fluconazole (DIFLUCAN) 100 MG tablet       . ibuprofen (ADVIL,MOTRIN) 200 MG tablet Take 200 mg by mouth every 6 (six) hours as needed.      . lidocaine-prilocaine (EMLA) cream       . LORazepam (ATIVAN) 0.5 MG tablet Take 1 tablet (0.5 mg total) by mouth every 8 (eight) hours as needed for anxiety.  60 tablet  3  . nitrofurantoin, macrocrystal-monohydrate, (MACROBID) 100 MG capsule Take 1 capsule (100 mg total) by mouth 2 (two) times daily.  14 capsule  0  . omeprazole (PRILOSEC) 20 MG capsule Take 20 mg by mouth daily.      . ondansetron (ZOFRAN) 8 MG tablet       . prochlorperazine (COMPAZINE) 10 MG tablet Take 1 tablet (10 mg total) by mouth every 6 (six) hours as needed.  30 tablet  3  . ranitidine (ZANTAC) 150 MG tablet Take 150 mg by mouth 2 (two) times daily.      . temazepam (RESTORIL) 15 MG capsule Take 1 capsule (15 mg total) by mouth at bedtime as needed for sleep.  30 capsule  1  .  zolpidem (AMBIEN) 5 MG tablet Take 5 mg by mouth at bedtime as needed.        OBJECTIVE: Filed Vitals:   04/22/12 1422  BP: 111/74  Pulse: 83  Temp: 98.2 F (36.8 C)     Body mass index is 27.28 kg/(m^2).    ECOG FS: 1  Physical Exam: HEENT:  Sclerae anicteric, conjunctivae pink.  Oropharynx  benign, no mucositis or ulcerations.   Nodes:  No cervical, supraclavicular, or axillary lymphadenopathy palpated.  Breast Exam:   deferred.  Lungs:  Clear to auscultation bilaterally.  No crackles, rhonchi, or wheezes.   Heart:  Regular rate and rhythm.  Abdomen:  Soft, nontender.  Positive bowel sounds.  No organomegaly or masses palpated.   Musculoskeletal:  No focal spinal tenderness to palpation.  Extremities:  Nonpitting edema in the right upper extremity. No additional peripheral edema and no peripheral cyanosis.   Skin:  Benign.   Neuro:  Nonfocal. Alert and oriented x3.    LAB RESULTS: Lab Results  Component Value Date   WBC 6.4 04/22/2012   NEUTROABS 3.9 04/22/2012   HGB 12.4 04/22/2012   HCT 36.4 04/22/2012   MCV 87.3 04/22/2012   PLT 180 04/22/2012      Chemistry      Component Value Date/Time   NA 138 04/15/2012 1346   K 3.5 04/15/2012 1346   CL 102 04/15/2012 1346   CO2 27 04/15/2012 1346   BUN 11 04/15/2012 1346   CREATININE 0.64 04/15/2012 1346      Component Value Date/Time   CALCIUM 9.1 04/15/2012 1346   ALKPHOS 60 04/15/2012 1346   AST 14 04/15/2012 1346   ALT 21 04/15/2012 1346   BILITOT 0.4 04/15/2012 1346       Lab Results  Component Value Date   LABCA2 23 02/20/2011     STUDIES:  04/16/2012 NUCLEAR MEDICINE PET SKULL BASE TO THIGH  Fasting Blood Glucose: 92  Technique: 17.8 mCi F-18 FDG was injected intravenously. CT data  was obtained and used for attenuation correction and anatomic  localization only. (This was not acquired as a diagnostic CT  examination.) Additional exam technical data entered on  technologist worksheet.  Comparison: CT thorax 02/26/2012    Findings:  Neck: No hypermetabolic nodes in the neck.  Chest: There is postoperative change in the right breast. There is  mild metabolic activity associated with the right axillary seromas  which is felt to be inflammatory. No hypermetabolic axillary or  supraclavicular lymph nodes. No hypermetabolic internal mammary  nodes or mediastinal nodes. Bilateral breast prosthetics are  noted. Review lung parenchyma demonstrates no suspicious pulmonary  nodules.  Abdomen / Pelvis:No abnormal hypermetabolic activity within the  solid organs. No evidence of abdominal or pelvic hypermetabolic  nodes.  There is hypermetabolic activity associate with a large 17 mm  diverticulum extending from the proximal sigmoid colon. There is  mild inflammation surrounding the diverticulum.  Skeleton:No focal hypermetabolic activity to suggest skeletal  metastasis.  IMPRESSION:  1. No evidence of residual breast carcinoma in right breast or  axilla.  2. No evidence of distant nodal metastasis or systemic metastasis.  3. Metabolic activity associated with a large s sigmoid  diverticulum consistent with mild diverticulitis.    ASSESSMENT: 46 year old BRCA 1-2 negative [place] woman  (1) status post left lumpectomy and sentinel lymph node biopsy December 2002 for a 1.9 cm invasive ductal carcinoma, no lymph node involvement, triple negative, treated with 4 cycles of Cytoxan and Adriamycin followed by radiation, with no evidence of disease recurrence to date.  (2) status post Right lumpectomy and axillary lymph node dissection 03/02/2012 for a T2 N2 invasive lobular breast cancer, grade 1, estrogen receptor 85% and progesterone receptor 98% positive, with HER-2 nonamplified and an MIB-1 of 9%; 10 of 10 lymph nodes involved. (3)  receiving adjuvant chemotherapy consisting of 4 every 3 week doses of docetaxel/cyclophosphamide, with Neulasta on day 2 for granulocyte support.  (4) history of afebrile  chemotherapy-induced neutropenia  PLAN: [name] is really tolerating treatment well. Her counts have recovered nicely and she is completing out her course of Cipro. She is already scheduled to  return next week for followup on May 16 prior to her third dose of docetaxel/cyclophosphamide. I am also going to request a referral to the lymphedema clinic for further evaluation of the right upper extremity.  [name] will need radiation following chemotherapy, and we will request an appointment with Dr. Kathrynn Running in mid June for reevaluation at that time.  Patient voices understanding and agreement with our plan, and will call with any changes or problems.   Damiano Stamper    04/22/2012

## 2012-04-27 ENCOUNTER — Other Ambulatory Visit: Payer: Self-pay | Admitting: Oncology

## 2012-04-28 ENCOUNTER — Ambulatory Visit: Payer: 59 | Admitting: Radiation Oncology

## 2012-04-28 ENCOUNTER — Ambulatory Visit: Payer: 59

## 2012-04-29 ENCOUNTER — Other Ambulatory Visit (HOSPITAL_BASED_OUTPATIENT_CLINIC_OR_DEPARTMENT_OTHER): Payer: 59 | Admitting: Lab

## 2012-04-29 ENCOUNTER — Encounter: Payer: Self-pay | Admitting: Physician Assistant

## 2012-04-29 ENCOUNTER — Ambulatory Visit: Payer: 59

## 2012-04-29 ENCOUNTER — Telehealth: Payer: Self-pay | Admitting: Oncology

## 2012-04-29 ENCOUNTER — Ambulatory Visit (HOSPITAL_BASED_OUTPATIENT_CLINIC_OR_DEPARTMENT_OTHER): Payer: 59 | Admitting: Physician Assistant

## 2012-04-29 ENCOUNTER — Ambulatory Visit: Payer: 59 | Admitting: Oncology

## 2012-04-29 VITALS — BP 123/88 | HR 99 | Temp 97.8°F | Ht 64.0 in | Wt 159.0 lb

## 2012-04-29 DIAGNOSIS — Z853 Personal history of malignant neoplasm of breast: Secondary | ICD-10-CM

## 2012-04-29 DIAGNOSIS — C50919 Malignant neoplasm of unspecified site of unspecified female breast: Secondary | ICD-10-CM

## 2012-04-29 LAB — CBC WITH DIFFERENTIAL/PLATELET
Basophils Absolute: 0 10*3/uL (ref 0.0–0.1)
EOS%: 0 % (ref 0.0–7.0)
Eosinophils Absolute: 0 10*3/uL (ref 0.0–0.5)
HCT: 38.1 % (ref 34.8–46.6)
HGB: 13.1 g/dL (ref 11.6–15.9)
LYMPH%: 6.4 % — ABNORMAL LOW (ref 14.0–49.7)
MCH: 30.1 pg (ref 25.1–34.0)
MCV: 87.6 fL (ref 79.5–101.0)
MONO%: 6 % (ref 0.0–14.0)
NEUT#: 18.8 10*3/uL — ABNORMAL HIGH (ref 1.5–6.5)
NEUT%: 87.5 % — ABNORMAL HIGH (ref 38.4–76.8)
Platelets: 318 10*3/uL (ref 145–400)

## 2012-04-29 MED ORDER — CEPHALEXIN 500 MG PO CAPS: 500.0000 mg | ORAL_CAPSULE | Freq: Three times a day (TID) | ORAL | Status: AC

## 2012-04-29 NOTE — Progress Notes (Signed)
ID: Christina Blackwell   DOB: [birth year]  MR#: [id number]  NWG#:[id number]  HISTORY OF PRESENT ILLNESS: Christina Blackwell is a 46 year old [place] woman formerly followed here for a left-sided stage I invasive ductal carcinoma, which was treated with adjuvant chemotherapy completed in 2003. She was released from followup last year.  On 12/28/2011 she had bilateral screening mammography showing a possible area of distortion in the right breast. She was recalled for additional views 01/09/2012 and this confirmed an area of architectural distortion in the lateral aspect of the right breast measuring 2.9 cm. This was not palpable by exam. Biopsy was performed the same day and showed ([id number]) and invasive lobular breast cancer, e-cadherin negative, strongly estrogen receptor positive at 85%, progesterone receptor positive at 98%, with an MIB-1 of 9%, and no HER-2 amplification.  Patient is status post right lumpectomy and x-ray lymph node dissection 03/02/2012 for a T2 N2 invasive lobular breast carcinoma, grade 1. 10 of 10 lymph nodes were positive. Margins were close but negative.  The decision was made to proceed with adjuvant chemotherapy consisting of 4 q. three-week doses of docetaxel/cyclophosphamide with Neulasta on day 2 for granulocyte support.    INTERVAL HISTORY: Christina Blackwell returns today for followup of her right breast carcinoma, due for day 1 cycle 3 of 4 planned q. three-week doses of docetaxel/cyclophosphamide. Overall, she is feeling well, but has noticed some skin changes and discomfort in the right breast.  She's noticed some redness along the lateral edge of the right breast over the past 7-10 days.   REVIEW OF SYSTEMS:  Christina Blackwell has had no fevers, chills, or night sweats. She's had no signs of abnormal bleeding. No nausea or change in bowel habits. No chest pain or shortness of breath. No abnormal headaches or dizziness. She's had no peripheral swelling other than some lymphedema in the right upper  extremity. She continues to deny any signs of peripheral neuropathy. No mouth ulcers or oral sensitivity. No excessive tearing.  A detailed review of systems is otherwise noncontributory.  PAST MEDICAL HISTORY: Past Medical History  Diagnosis Date  . History of bilateral breast implants   . GERD (gastroesophageal reflux disease)   . Complication of anesthesia   . PONV (postoperative nausea and vomiting)   . History of radiation therapy 03/30/02-05/16/02    left breast   . History of chemotherapy     cyclosphosphamide/ doxorubicin  . Breast cancer 2002    left   . Breast cancer 2012    right  . Anxiety   . Depression   . Glaucoma     PAST SURGICAL HISTORY: Past Surgical History  Procedure Date  . Portacath placement   . Portacath removal   . Tonsillectomy and adenoidectomy   . Breast lumpectomy 11/2001    left  . Sentinel lymph node biopsy 11/2001    left axillary  . Breast enhancement surgery 2003    bilateral  . Breast lumpectomy w/ needle localization 02/05/2012    right/ slnbx  . Mirena iud   . Axillary lymph node dissection 03/02/2012    Procedure: AXILLARY LYMPH NODE DISSECTION;  Surgeon: Kandis Cocking, MD;  [facility];  Service: General;  Laterality: Right;    FAMILY HISTORY Family History  Problem Relation Age of Onset  . Cancer Mother     breast  . Cancer Paternal Aunt     breast  . Cancer Cousin     breast ca 8's 1st cousin    Gynecologic history:  Menarche age 10, she is GX P0. She is currently using a Mirena implant   Social history:  She works for [employer], but her job was recently eliminated, and she is temporarily employed by them at present. She is divorced and lives by herself, with 2 birds, and [place] parrot and a cockateal. 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   ADVANCED DIRECTIVES:  HEALTH MAINTENANCE: History  Substance Use Topics  . Smoking status: Never Smoker   . Smokeless tobacco: Not on file  .  Alcohol Use: Yes     social occasio0nally 1-2      Colonoscopy:  PAP:  Bone density:  Lipid panel:  No Known Allergies  Current Outpatient Prescriptions  Medication Sig Dispense Refill  . cephALEXin (KEFLEX) 500 MG capsule Take 1 capsule (500 mg total) by mouth 3 (three) times daily.  30 capsule  0  . cetirizine (ZYRTEC) 10 MG tablet Take 10 mg by mouth as needed.      . ciprofloxacin (CIPRO) 500 MG tablet       . clonazePAM (KLONOPIN) 0.5 MG tablet Take 0.5 mg by mouth 2 (two) times daily as needed. Per patient stated 02/23/12      . dexamethasone (DECADRON) 4 MG tablet       . fluconazole (DIFLUCAN) 100 MG tablet       . ibuprofen (ADVIL,MOTRIN) 200 MG tablet Take 200 mg by mouth every 6 (six) hours as needed.      . lidocaine-prilocaine (EMLA) cream       . LORazepam (ATIVAN) 0.5 MG tablet Take 1 tablet (0.5 mg total) by mouth every 8 (eight) hours as needed for anxiety.  60 tablet  3  . nitrofurantoin, macrocrystal-monohydrate, (MACROBID) 100 MG capsule Take 1 capsule (100 mg total) by mouth 2 (two) times daily.  14 capsule  0  . omeprazole (PRILOSEC) 20 MG capsule Take 20 mg by mouth daily.      . ondansetron (ZOFRAN) 8 MG tablet       . prochlorperazine (COMPAZINE) 10 MG tablet Take 1 tablet (10 mg total) by mouth every 6 (six) hours as needed.  30 tablet  3  . ranitidine (ZANTAC) 150 MG tablet Take 150 mg by mouth 2 (two) times daily.      . temazepam (RESTORIL) 15 MG capsule Take 1 capsule (15 mg total) by mouth at bedtime as needed for sleep.  30 capsule  1  . zolpidem (AMBIEN) 5 MG tablet Take 5 mg by mouth at bedtime as needed.        OBJECTIVE: Filed Vitals:   04/29/12 1129  BP: 123/88  Pulse: 99  Temp: 97.8 F (36.6 C)     Body mass index is 27.29 kg/(m^2).    ECOG FS: 1  Physical Exam: HEENT:  Sclerae anicteric, conjunctivae pink.  Oropharynx  benign, no mucositis or ulcerations.   Nodes:  No cervical, supraclavicular, or axillary lymphadenopathy palpated.    Breast Exam:   Right breast, status post lumpectomy with well-healed incision. There is a large area of erythema along the lateral and superior portion of the right breast, including the axillary incision. The area is warm to touch, slightly tender. There is no nipple inversion or nipple discharge. No palpable masses. Left breast is status post remote lumpectomy with no suspicious masses, skin changes, or nipple inversion.  Lungs:  Clear to auscultation bilaterally.  No crackles, rhonchi, or wheezes.   Heart:  Regular rate and rhythm.   Abdomen:  Soft, nontender.  Positive bowel sounds.  No organomegaly or masses palpated.   Musculoskeletal:  No focal spinal tenderness to palpation.  Extremities:  Nonpitting edema in the right upper extremity. No additional peripheral edema and no peripheral cyanosis.   Skin:  Benign with the exception of the breast as noted above. Neuro:  Nonfocal. Alert and oriented x3.    LAB RESULTS: Lab Results  Component Value Date   WBC 21.5* 04/29/2012   NEUTROABS 18.8* 04/29/2012   HGB 13.1 04/29/2012   HCT 38.1 04/29/2012   MCV 87.6 04/29/2012   PLT 318 04/29/2012      Chemistry      Component Value Date/Time   NA 138 04/15/2012 1346   K 3.5 04/15/2012 1346   CL 102 04/15/2012 1346   CO2 27 04/15/2012 1346   BUN 11 04/15/2012 1346   CREATININE 0.64 04/15/2012 1346      Component Value Date/Time   CALCIUM 9.1 04/15/2012 1346   ALKPHOS 60 04/15/2012 1346   AST 14 04/15/2012 1346   ALT 21 04/15/2012 1346   BILITOT 0.4 04/15/2012 1346       Lab Results  Component Value Date   LABCA2 23 02/20/2011     STUDIES:  04/16/2012 NUCLEAR MEDICINE PET SKULL BASE TO THIGH  Fasting Blood Glucose: 92  Technique: 17.8 mCi F-18 FDG was injected intravenously. CT data  was obtained and used for attenuation correction and anatomic  localization only. (This was not acquired as a diagnostic CT  examination.) Additional exam technical data entered on  technologist worksheet.   Comparison: CT thorax 02/26/2012  Findings:  Neck: No hypermetabolic nodes in the neck.  Chest: There is postoperative change in the right breast. There is  mild metabolic activity associated with the right axillary seromas  which is felt to be inflammatory. No hypermetabolic axillary or  supraclavicular lymph nodes. No hypermetabolic internal mammary  nodes or mediastinal nodes. Bilateral breast prosthetics are  noted. Review lung parenchyma demonstrates no suspicious pulmonary  nodules.  Abdomen / Pelvis:No abnormal hypermetabolic activity within the  solid organs. No evidence of abdominal or pelvic hypermetabolic  nodes.  There is hypermetabolic activity associate with a large 17 mm  diverticulum extending from the proximal sigmoid colon. There is  mild inflammation surrounding the diverticulum.  Skeleton:No focal hypermetabolic activity to suggest skeletal  metastasis.  IMPRESSION:  1. No evidence of residual breast carcinoma in right breast or  axilla.  2. No evidence of distant nodal metastasis or systemic metastasis.  3. Metabolic activity associated with a large s sigmoid  diverticulum consistent with mild diverticulitis.    ASSESSMENT: 45 year old BRCA 1-2 negative [place] woman  (1) status post left lumpectomy and sentinel lymph node biopsy December 2002 for a 1.9 cm invasive ductal carcinoma, no lymph node involvement, triple negative, treated with 4 cycles of Cytoxan and Adriamycin followed by radiation, with no evidence of disease recurrence to date.  (2) status post Right lumpectomy and axillary lymph node dissection 03/02/2012 for a T2 N2 invasive lobular breast cancer, grade 1, estrogen receptor 85% and progesterone receptor 98% positive, with HER-2 nonamplified and an MIB-1 of 9%; 10 of 10 lymph nodes involved. (3)  receiving adjuvant chemotherapy consisting of 4 every 3 week doses of docetaxel/cyclophosphamide, with Neulasta on day 2 for granulocyte support.  (4)  history of afebrile chemotherapy-induced neutropenia  PLAN: This case was reviewed with Dr. Darnelle Catalan, and unfortunately, due to an apparent case of cellulitis in the right breast, we  both feel it would be prudent to hold Christina Blackwell's treatment today. Accordingly, I am starting her on Keflex, 500 mg by mouth 3 times a day for the next 10 days. She will return to see Dr. Darnelle Catalan next week on May 22nd, and we will hope to proceed to her third dose of docetaxel/cyclophosphamide on May 23.   Christina Blackwell is really disappointed about the delay, but voices her understanding and agreement. She also knows to contact us if the breast becomes more painful, swollen, or if the reddened area increases. Certainly she would call as well with any fevers, chills, or night sweats.  Patient voices understanding and agreement with our plan, and will call with any changes or problems.   Wasil Wolke    04/29/2012

## 2012-04-29 NOTE — Telephone Encounter (Signed)
gve the pt her may,june 2013 appt calendar pt is aware that her chemo appts will be added. Sent michelle a staff message for the chemo appts to be added.

## 2012-04-30 ENCOUNTER — Ambulatory Visit: Payer: 59

## 2012-04-30 ENCOUNTER — Telehealth: Payer: Self-pay | Admitting: *Deleted

## 2012-04-30 NOTE — Telephone Encounter (Signed)
Per staff message from [place], I have scheduled treatment appt. Appt in computer and Crystal aware.  JMW

## 2012-05-04 NOTE — Progress Notes (Signed)
Encounter addended by: Mollye Guinta Bruner Mavric Cortright, RN on: 05/04/2012 10:22 AM<BR>     Documentation filed: Charges VN

## 2012-05-05 ENCOUNTER — Ambulatory Visit (HOSPITAL_BASED_OUTPATIENT_CLINIC_OR_DEPARTMENT_OTHER): Payer: 59 | Admitting: Oncology

## 2012-05-05 ENCOUNTER — Telehealth: Payer: Self-pay | Admitting: *Deleted

## 2012-05-05 ENCOUNTER — Other Ambulatory Visit (HOSPITAL_BASED_OUTPATIENT_CLINIC_OR_DEPARTMENT_OTHER): Payer: 59 | Admitting: Lab

## 2012-05-05 VITALS — BP 114/74 | HR 99 | Temp 98.9°F | Ht 64.0 in | Wt 161.3 lb

## 2012-05-05 DIAGNOSIS — Z170 Estrogen receptor positive status [ER+]: Secondary | ICD-10-CM

## 2012-05-05 DIAGNOSIS — Z853 Personal history of malignant neoplasm of breast: Secondary | ICD-10-CM

## 2012-05-05 DIAGNOSIS — C50919 Malignant neoplasm of unspecified site of unspecified female breast: Secondary | ICD-10-CM

## 2012-05-05 DIAGNOSIS — C773 Secondary and unspecified malignant neoplasm of axilla and upper limb lymph nodes: Secondary | ICD-10-CM

## 2012-05-05 LAB — CBC WITH DIFFERENTIAL/PLATELET
BASO%: 0.2 % (ref 0.0–2.0)
HCT: 38 % (ref 34.8–46.6)
LYMPH%: 20.3 % (ref 14.0–49.7)
MCHC: 33.9 g/dL (ref 31.5–36.0)
MCV: 88.6 fL (ref 79.5–101.0)
MONO#: 0.5 10*3/uL (ref 0.1–0.9)
MONO%: 9 % (ref 0.0–14.0)
NEUT%: 61.4 % (ref 38.4–76.8)
Platelets: 212 10*3/uL (ref 145–400)
RBC: 4.29 10*6/uL (ref 3.70–5.45)
nRBC: 0 % (ref 0–0)

## 2012-05-05 NOTE — Telephone Encounter (Signed)
per orders from 05-05-2012 change chemo from 05-18-2012 to 05-27-2012 added on injection for 05-08-2012 at 11:00am printed out calendar and gave to the patient

## 2012-05-05 NOTE — Progress Notes (Signed)
ID: Christina Blackwell   DOB: 23-Jun-1966  MR#: [id number]  CSN#:[id number]  HISTORY OF PRESENT ILLNESS: Christina Blackwell is a 46 year old [place] woman formerly followed here for a left-sided stage I invasive ductal carcinoma, which was treated with adjuvant chemotherapy completed in 2003. She was released from followup last year.  On 12/28/2011 she had bilateral screening mammography showing a possible area of distortion in the right breast. She was recalled for additional views 01/09/2012 and this confirmed an area of architectural distortion in the lateral aspect of the right breast measuring 2.9 cm. This was not palpable by exam. Biopsy was performed the same day and showed ([id number]) and invasive lobular breast cancer, e-cadherin negative, strongly estrogen receptor positive at 85%, progesterone receptor positive at 98%, with an MIB-1 of 9%, and no HER-2 amplification.  Patient is status post right lumpectomy and x-ray lymph node dissection 03/02/2012 for a T2 N2 invasive lobular breast carcinoma, grade 1. 10 of 10 lymph nodes were positive. Margins were close but negative. Her subsequent history is as detailed below    INTERVAL HISTORY: Christina Blackwell returns today for followup of her right breast carcinoma. Her third cycle of chemotherapy had to be postponed because of R breast cellulitis; she was started on cephalexin and is tolerating that well.   REVIEW OF SYSTEMS: She is tolerating this chemotherapy much better than the earlier one she had a, and specifically she hasn't had any problems with nausea or vomiting. She history doing Thursday, feels tired Friday Saturday Sunday Monday, feels better by Tuesday. She has continued to work although she tends to take off 2 or 3 days of the week after chemotherapy. She has decided not to receive Neulasta because it made her feel so bad previously, but we discussed Neupogen and she is agreeable to trying that. She is going out to eat with friends, coming to support groups,  and generally from a psychological point of view of feels much more in control. She is having some insomnia, which is not a new symptom. Overall I think she is tolerating chemotherapy quite well.  PAST MEDICAL HISTORY: Past Medical History  Diagnosis Date  . History of bilateral breast implants   . GERD (gastroesophageal reflux disease)   . Complication of anesthesia   . PONV (postoperative nausea and vomiting)   . History of radiation therapy 03/30/02-05/16/02    left breast   . History of chemotherapy     cyclosphosphamide/ doxorubicin  . Breast cancer 2002    left   . Breast cancer 2012    right  . Anxiety   . Depression   . Glaucoma     PAST SURGICAL HISTORY: Past Surgical History  Procedure Date  . Portacath placement   . Portacath removal   . Tonsillectomy and adenoidectomy   . Breast lumpectomy 11/2001    left  . Sentinel lymph node biopsy 11/2001    left axillary  . Breast enhancement surgery 2003    bilateral  . Breast lumpectomy w/ needle localization 02/05/2012    right/ slnbx  . Mirena iud   . Axillary lymph node dissection 03/02/2012    Procedure: AXILLARY LYMPH NODE DISSECTION;  Surgeon: Kandis Cocking, MD;  Location: [facility];  Service: General;  Laterality: Right;    FAMILY HISTORY Family History  Problem Relation Age of Onset  . Cancer Mother     breast  . Cancer Paternal Aunt     breast  . Cancer Cousin     breast  ca 87's 1st cousin    Gynecologic history:  Menarche age 25, she is GX P0. She is currently using a Mirena implant   Social history:  She works for [employer], but her job was recently eliminated, and she is temporarily employed by them at present. She is divorced and lives by herself, with 2 birds, an [place] parrot and a cockateal. 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   ADVANCED DIRECTIVES:  HEALTH MAINTENANCE: History  Substance Use Topics  . Smoking status: Never Smoker   . Smokeless tobacco: Not on file    . Alcohol Use: Yes     social occasio0nally 1-2      Colonoscopy:  PAP:  Bone density:  Lipid panel:  No Known Allergies  Current Outpatient Prescriptions  Medication Sig Dispense Refill  . cephALEXin (KEFLEX) 500 MG capsule Take 1 capsule (500 mg total) by mouth 3 (three) times daily.  30 capsule  0  . cetirizine (ZYRTEC) 10 MG tablet Take 10 mg by mouth as needed.      . clonazePAM (KLONOPIN) 0.5 MG tablet Take 0.5 mg by mouth 2 (two) times daily as needed. Per patient stated 02/23/12      . dexamethasone (DECADRON) 4 MG tablet       . ibuprofen (ADVIL,MOTRIN) 200 MG tablet Take 200 mg by mouth every 6 (six) hours as needed.      . lidocaine-prilocaine (EMLA) cream       . LORazepam (ATIVAN) 0.5 MG tablet Take 1 tablet (0.5 mg total) by mouth every 8 (eight) hours as needed for anxiety.  60 tablet  3  . omeprazole (PRILOSEC) 20 MG capsule Take 20 mg by mouth daily.      . ondansetron (ZOFRAN) 8 MG tablet       . ranitidine (ZANTAC) 150 MG tablet Take 150 mg by mouth 2 (two) times daily.      Marland Kitchen zolpidem (AMBIEN) 5 MG tablet Take 5 mg by mouth at bedtime as needed.      . ciprofloxacin (CIPRO) 500 MG tablet       . fluconazole (DIFLUCAN) 100 MG tablet       . prochlorperazine (COMPAZINE) 10 MG tablet Take 1 tablet (10 mg total) by mouth every 6 (six) hours as needed.  30 tablet  3  . temazepam (RESTORIL) 15 MG capsule Take 1 capsule (15 mg total) by mouth at bedtime as needed for sleep.  30 capsule  1    OBJECTIVE: Middle-aged white woman in no acute distress Filed Vitals:   05/05/12 0936  BP: 114/74  Pulse: 99  Temp: 98.9 F (37.2 C)     Body mass index is 27.69 kg/(m^2).    ECOG FS: 1  Physical Exam: HEENT:  Sclerae anicteric, conjunctivae pink.  Oropharynx  benign, no mucositis or ulcerations.   Nodes:  No cervical, supraclavicular, or axillary lymphadenopathy palpated.  Breast Exam:   Right breast, status post lumpectomy with well-healed incision. The right breast  cellulitis is just about entirely resolved, with only minimal pink blush remaining. She has a palpable of fluid collection in the right axilla, which is unchanged from baseline, nontender, not read or hot. Left breast is status post lumpectomy, otherwise unremarkable. Lungs:  Clear to auscultation bilaterally.  No crackles, rhonchi, or wheezes.   Heart:  Regular rate and rhythm.   Abdomen:  Soft, nontender.  Positive bowel sounds.  No organomegaly or masses palpated.   Musculoskeletal:  No focal spinal tenderness to  palpation.  Extremities:  Nonpitting edema in the right upper extremity. No additional peripheral edema and no peripheral cyanosis.   Neuro:  Nonfocal. Alert and oriented x3.    LAB RESULTS: Lab Results  Component Value Date   WBC 5.8 05/05/2012   NEUTROABS 3.6 05/05/2012   HGB 12.9 05/05/2012   HCT 38.0 05/05/2012   MCV 88.6 05/05/2012   PLT 212 05/05/2012      Chemistry      Component Value Date/Time   NA 138 04/15/2012 1346   K 3.5 04/15/2012 1346   CL 102 04/15/2012 1346   CO2 27 04/15/2012 1346   BUN 11 04/15/2012 1346   CREATININE 0.64 04/15/2012 1346      Component Value Date/Time   CALCIUM 9.1 04/15/2012 1346   ALKPHOS 60 04/15/2012 1346   AST 14 04/15/2012 1346   ALT 21 04/15/2012 1346   BILITOT 0.4 04/15/2012 1346       Lab Results  Component Value Date   LABCA2 23 02/20/2011     STUDIES:  04/16/2012 NUCLEAR MEDICINE PET SKULL BASE TO THIGH  Fasting Blood Glucose: 92  Technique: 17.8 mCi F-18 FDG was injected intravenously. CT data  was obtained and used for attenuation correction and anatomic  localization only. (This was not acquired as a diagnostic CT  examination.) Additional exam technical data entered on  technologist worksheet.  Comparison: CT thorax 02/26/2012  Findings:  Neck: No hypermetabolic nodes in the neck.  Chest: There is postoperative change in the right breast. There is  mild metabolic activity associated with the right axillary seromas  which  is felt to be inflammatory. No hypermetabolic axillary or  supraclavicular lymph nodes. No hypermetabolic internal mammary  nodes or mediastinal nodes. Bilateral breast prosthetics are  noted. Review lung parenchyma demonstrates no suspicious pulmonary  nodules.  Abdomen / Pelvis:No abnormal hypermetabolic activity within the  solid organs. No evidence of abdominal or pelvic hypermetabolic  nodes.  There is hypermetabolic activity associate with a large 17 mm  diverticulum extending from the proximal sigmoid colon. There is  mild inflammation surrounding the diverticulum.  Skeleton:No focal hypermetabolic activity to suggest skeletal  metastasis.  IMPRESSION:  1. No evidence of residual breast carcinoma in right breast or  axilla.  2. No evidence of distant nodal metastasis or systemic metastasis.  3. Metabolic activity associated with a large s sigmoid  diverticulum consistent with mild diverticulitis.    ASSESSMENT: 46 year old BRCA 1-2 negative [place] woman  (1) status post left lumpectomy and sentinel lymph node biopsy December 2002 for a 1.9 cm invasive ductal carcinoma, no lymph node involvement, triple negative, treated with 4 cycles of Cytoxan and Adriamycin followed by radiation, with no evidence of disease recurrence to date.  (2) status post Right lumpectomy and axillary lymph node dissection 03/02/2012 for a T2 N2 invasive lobular breast cancer, grade 1, estrogen receptor 85% and progesterone receptor 98% positive, with HER-2 nonamplified and an MIB-1 of 9%; 10 of 10 lymph nodes involved. (3)  receiving adjuvant chemotherapy consisting of 4 planned, every 3 week doses of docetaxel/cyclophosphamide  (4) history of afebrile chemotherapy-induced neutropenia  PLAN: She will receive her third of her 4 planned cycles of chemotherapy tomorrow. I think she will do well with Neupogen and I have written for her to receive that on days 2 and 3 after this cycle. Her final cycle will  be on June 14, if all goes well. I am pleased to see how much better she is doing  psychologically and that she is making good use of support groups both through the cancer center and socially. She knows to call for any problems that may develop before the next visit. Kordae Buonocore C    05/05/2012

## 2012-05-06 ENCOUNTER — Ambulatory Visit (HOSPITAL_BASED_OUTPATIENT_CLINIC_OR_DEPARTMENT_OTHER): Payer: 59

## 2012-05-06 DIAGNOSIS — Z5111 Encounter for antineoplastic chemotherapy: Secondary | ICD-10-CM

## 2012-05-06 DIAGNOSIS — C50919 Malignant neoplasm of unspecified site of unspecified female breast: Secondary | ICD-10-CM

## 2012-05-06 MED ORDER — SODIUM CHLORIDE 0.9 % IJ SOLN
10.0000 mL | INTRAMUSCULAR | Status: DC | PRN
  Administered 2012-05-06: 10 mL
  Filled 2012-05-06: qty 10

## 2012-05-06 MED ORDER — HEPARIN SOD (PORK) LOCK FLUSH 100 UNIT/ML IV SOLN
500.0000 [IU] | Freq: Once | INTRAVENOUS | Status: AC | PRN
  Administered 2012-05-06: 500 [IU]
  Filled 2012-05-06: qty 5

## 2012-05-06 MED ORDER — DOCETAXEL CHEMO INJECTION 160 MG/16ML
75.0000 mg/m2 | Freq: Once | INTRAVENOUS | Status: AC
  Administered 2012-05-06: 140 mg via INTRAVENOUS
  Filled 2012-05-06: qty 14

## 2012-05-06 MED ORDER — SODIUM CHLORIDE 0.9 % IV SOLN
600.0000 mg/m2 | Freq: Once | INTRAVENOUS | Status: AC
  Administered 2012-05-06: 1100 mg via INTRAVENOUS
  Filled 2012-05-06: qty 55

## 2012-05-06 MED ORDER — SODIUM CHLORIDE 0.9 % IV SOLN
Freq: Once | INTRAVENOUS | Status: AC
  Administered 2012-05-06: 13:00:00 via INTRAVENOUS

## 2012-05-06 MED ORDER — DEXAMETHASONE SODIUM PHOSPHATE 4 MG/ML IJ SOLN
20.0000 mg | Freq: Once | INTRAMUSCULAR | Status: AC
  Administered 2012-05-06: 20 mg via INTRAVENOUS

## 2012-05-06 MED ORDER — ONDANSETRON 16 MG/50ML IVPB (CHCC)
16.0000 mg | Freq: Once | INTRAVENOUS | Status: AC
  Administered 2012-05-06: 16 mg via INTRAVENOUS

## 2012-05-07 ENCOUNTER — Ambulatory Visit (HOSPITAL_BASED_OUTPATIENT_CLINIC_OR_DEPARTMENT_OTHER): Payer: 59

## 2012-05-07 ENCOUNTER — Other Ambulatory Visit: Payer: Self-pay | Admitting: Certified Registered Nurse Anesthetist

## 2012-05-07 VITALS — BP 103/63 | HR 84 | Temp 97.2°F

## 2012-05-07 DIAGNOSIS — Z853 Personal history of malignant neoplasm of breast: Secondary | ICD-10-CM

## 2012-05-07 DIAGNOSIS — C50919 Malignant neoplasm of unspecified site of unspecified female breast: Secondary | ICD-10-CM

## 2012-05-07 MED ORDER — PEGFILGRASTIM INJECTION 6 MG/0.6ML: 6.0000 mg | Freq: Once | SUBCUTANEOUS | Status: DC

## 2012-05-07 MED ORDER — FILGRASTIM 300 MCG/0.5ML IJ SOLN
300.0000 ug | Freq: Once | INTRAMUSCULAR | Status: AC
  Administered 2012-05-07: 300 ug via SUBCUTANEOUS
  Filled 2012-05-07: qty 0.5

## 2012-05-08 ENCOUNTER — Ambulatory Visit: Payer: 59

## 2012-05-08 ENCOUNTER — Other Ambulatory Visit: Payer: Self-pay | Admitting: *Deleted

## 2012-05-12 ENCOUNTER — Ambulatory Visit: Payer: 59 | Admitting: Physical Therapy

## 2012-05-13 ENCOUNTER — Ambulatory Visit (HOSPITAL_BASED_OUTPATIENT_CLINIC_OR_DEPARTMENT_OTHER): Payer: 59 | Admitting: Physician Assistant

## 2012-05-13 ENCOUNTER — Encounter: Payer: Self-pay | Admitting: Physician Assistant

## 2012-05-13 ENCOUNTER — Other Ambulatory Visit (HOSPITAL_BASED_OUTPATIENT_CLINIC_OR_DEPARTMENT_OTHER): Payer: 59 | Admitting: Lab

## 2012-05-13 VITALS — BP 113/80 | HR 90 | Temp 99.0°F | Ht 64.0 in | Wt 157.4 lb

## 2012-05-13 DIAGNOSIS — C50919 Malignant neoplasm of unspecified site of unspecified female breast: Secondary | ICD-10-CM

## 2012-05-13 DIAGNOSIS — C773 Secondary and unspecified malignant neoplasm of axilla and upper limb lymph nodes: Secondary | ICD-10-CM

## 2012-05-13 LAB — CBC WITH DIFFERENTIAL/PLATELET
BASO%: 0.6 % (ref 0.0–2.0)
EOS%: 2.3 % (ref 0.0–7.0)
HCT: 37.9 % (ref 34.8–46.6)
MCH: 30 pg (ref 25.1–34.0)
MCHC: 34.8 g/dL (ref 31.5–36.0)
MONO#: 0.3 10*3/uL (ref 0.1–0.9)
RBC: 4.4 10*6/uL (ref 3.70–5.45)
RDW: 14.3 % (ref 11.2–14.5)
WBC: 1.7 10*3/uL — ABNORMAL LOW (ref 3.9–10.3)
lymph#: 1.3 10*3/uL (ref 0.9–3.3)
nRBC: 0 % (ref 0–0)

## 2012-05-13 NOTE — Progress Notes (Signed)
ID: ANAVICTORIA Blackwell   DOB: 07-13-1966  MR#: [id number]  CSN#:[id number]  HISTORY OF PRESENT ILLNESS: Christina Blackwell is a 46 year old [place] woman formerly followed here for a left-sided stage I invasive ductal carcinoma, which was treated with adjuvant chemotherapy completed in 2003. She was released from followup last year.  On 12/28/2011 she had bilateral screening mammography showing a possible area of distortion in the right breast. She was recalled for additional views 01/09/2012 and this confirmed an area of architectural distortion in the lateral aspect of the right breast measuring 2.9 cm. This was not palpable by exam. Biopsy was performed the same day and showed ([id number]) and invasive lobular breast cancer, e-cadherin negative, strongly estrogen receptor positive at 85%, progesterone receptor positive at 98%, with an MIB-1 of 9%, and no HER-2 amplification.  Patient is status post right lumpectomy and x-ray lymph node dissection 03/02/2012 for a T2 N2 invasive lobular breast carcinoma, grade 1. 10 of 10 lymph nodes were positive. Margins were close but negative. Her subsequent history is as detailed below    INTERVAL HISTORY: Christina Blackwell returns today for followup of her right breast carcinoma. She's currently day 8 cycle 3 of 4 planned q. three-week doses of docetaxel/cyclophosphamide given in the adjuvant setting. She was recently treated with Keflex for a right breast cellulitis which has resolved. She completed a course of antibiotics, as had no fevers, chills, or night sweats.  Christina Blackwell tolerated her third cycle of chemotherapy well. She did receive only one Neupogen injection following treatment on day 2. Christina Blackwell's energy level is fair, and she continues to work full-time at [employer].  REVIEW OF SYSTEMS: Patient denies any nausea, diarrhea, or constipation. She's had no cough, shortness of breath, or chest pain. No abnormal headaches, dizziness, or change in vision. No unusual myalgias, arthralgias, or  bony pain. She continues to have some occasional anxiety but this has improved. She also has some occasional difficulty sleeping which has not changed.  A detailed review of systems is otherwise noncontributory.  PAST MEDICAL HISTORY: Past Medical History  Diagnosis Date  . History of bilateral breast implants   . GERD (gastroesophageal reflux disease)   . Complication of anesthesia   . PONV (postoperative nausea and vomiting)   . History of radiation therapy 03/30/02-05/16/02    left breast   . History of chemotherapy     cyclosphosphamide/ doxorubicin  . Breast cancer 2002    left   . Breast cancer 2012    right  . Anxiety   . Depression   . Glaucoma     PAST SURGICAL HISTORY: Past Surgical History  Procedure Date  . Portacath placement   . Portacath removal   . Tonsillectomy and adenoidectomy   . Breast lumpectomy 11/2001    left  . Sentinel lymph node biopsy 11/2001    left axillary  . Breast enhancement surgery 2003    bilateral  . Breast lumpectomy w/ needle localization 02/05/2012    right/ slnbx  . Mirena iud   . Axillary lymph node dissection 03/02/2012    Procedure: AXILLARY LYMPH NODE DISSECTION;  Surgeon: Kandis Cocking, MD;  Location:  [facility];  Service: General;  Laterality: Right;    FAMILY HISTORY Family History  Problem Relation Age of Onset  . Cancer Mother     breast  . Cancer Paternal Aunt     breast  . Cancer Cousin     breast ca 56's 1st cousin    Gynecologic history:  Menarche age  12, she is GX P0. She is currently using a Mirena implant   Social history:  She works for [employer], but her job was recently eliminated, and she is temporarily employed by them at present. She is divorced and lives by herself, with 2 birds, an [place] parrot and a cockateal. 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   ADVANCED DIRECTIVES:  HEALTH MAINTENANCE: History  Substance Use Topics  . Smoking status: Never Smoker   . Smokeless  tobacco: Not on file  . Alcohol Use: Yes     social occasio0nally 1-2      Colonoscopy:  PAP:  Bone density:  Lipid panel:  No Known Allergies  Current Outpatient Prescriptions  Medication Sig Dispense Refill  . cetirizine (ZYRTEC) 10 MG tablet Take 10 mg by mouth as needed.      . ciprofloxacin (CIPRO) 500 MG tablet       . clonazePAM (KLONOPIN) 0.5 MG tablet Take 0.5 mg by mouth 2 (two) times daily as needed. Per patient stated 02/23/12      . dexamethasone (DECADRON) 4 MG tablet       . fluconazole (DIFLUCAN) 100 MG tablet       . ibuprofen (ADVIL,MOTRIN) 200 MG tablet Take 200 mg by mouth every 6 (six) hours as needed.      . lidocaine-prilocaine (EMLA) cream       . LORazepam (ATIVAN) 0.5 MG tablet Take 1 tablet (0.5 mg total) by mouth every 8 (eight) hours as needed for anxiety.  60 tablet  3  . omeprazole (PRILOSEC) 20 MG capsule Take 20 mg by mouth daily.      . ondansetron (ZOFRAN) 8 MG tablet       . prochlorperazine (COMPAZINE) 10 MG tablet Take 1 tablet (10 mg total) by mouth every 6 (six) hours as needed.  30 tablet  3  . ranitidine (ZANTAC) 150 MG tablet Take 150 mg by mouth 2 (two) times daily.      . temazepam (RESTORIL) 15 MG capsule Take 1 capsule (15 mg total) by mouth at bedtime as needed for sleep.  30 capsule  1  . zolpidem (AMBIEN) 5 MG tablet Take 5 mg by mouth at bedtime as needed.        OBJECTIVE: Middle-aged white woman in no acute distress Filed Vitals:   05/13/12 1337  BP: 113/80  Pulse: 90  Temp: 99 F (37.2 C)     Body mass index is 27.02 kg/(m^2).    ECOG FS: 1  Physical Exam: HEENT:  Sclerae anicteric, conjunctivae pink.  Oropharynx  is notable for a white coating on the tongue and buccal mucosa. No ulcerations or evidence of mucositis.   Nodes:  No cervical, supraclavicular, or axillary lymphadenopathy palpated.  Breast Exam:   Right breast, status post lumpectomy with well-healed incision. No significant erythema or skin changes, and no  evidence of infection or cellulitis in the breast. She has a palpable area of fluid collection in the right axilla, which is unchanged from baseline, nontender, not red or hot. Left breast is status post lumpectomy, otherwise unremarkable, with no masses, skin changes, or nipple inversion. Lungs:  Clear to auscultation bilaterally.  No crackles, rhonchi, or wheezes.   Heart:  Regular rate and rhythm.   Abdomen:  Soft, nontender.  Positive bowel sounds.  No organomegaly or masses palpated.   Musculoskeletal:  No focal spinal tenderness to palpation.  Extremities:  Nonpitting edema in the right upper extremity. No additional peripheral  edema and no peripheral cyanosis.   Neuro:  Nonfocal. Alert and oriented x3.    LAB RESULTS: Lab Results  Component Value Date   WBC 1.7* 05/13/2012   NEUTROABS 0.1* 05/13/2012   HGB 13.2 05/13/2012   HCT 37.9 05/13/2012   MCV 86.1 05/13/2012   PLT 202 05/13/2012      Chemistry      Component Value Date/Time   NA 138 04/15/2012 1346   K 3.5 04/15/2012 1346   CL 102 04/15/2012 1346   CO2 27 04/15/2012 1346   BUN 11 04/15/2012 1346   CREATININE 0.64 04/15/2012 1346      Component Value Date/Time   CALCIUM 9.1 04/15/2012 1346   ALKPHOS 60 04/15/2012 1346   AST 14 04/15/2012 1346   ALT 21 04/15/2012 1346   BILITOT 0.4 04/15/2012 1346       Lab Results  Component Value Date   LABCA2 23 02/20/2011     STUDIES:  04/16/2012 NUCLEAR MEDICINE PET SKULL BASE TO THIGH  Fasting Blood Glucose: 92  Technique: 17.8 mCi F-18 FDG was injected intravenously. CT data  was obtained and used for attenuation correction and anatomic  localization only. (This was not acquired as a diagnostic CT  examination.) Additional exam technical data entered on  technologist worksheet.  Comparison: CT thorax 02/26/2012  Findings:  Neck: No hypermetabolic nodes in the neck.  Chest: There is postoperative change in the right breast. There is  mild metabolic activity associated with the right  axillary seromas  which is felt to be inflammatory. No hypermetabolic axillary or  supraclavicular lymph nodes. No hypermetabolic internal mammary  nodes or mediastinal nodes. Bilateral breast prosthetics are  noted. Review lung parenchyma demonstrates no suspicious pulmonary  nodules.  Abdomen / Pelvis:No abnormal hypermetabolic activity within the  solid organs. No evidence of abdominal or pelvic hypermetabolic  nodes.  There is hypermetabolic activity associate with a large 17 mm  diverticulum extending from the proximal sigmoid colon. There is  mild inflammation surrounding the diverticulum.  Skeleton:No focal hypermetabolic activity to suggest skeletal  metastasis.  IMPRESSION:  1. No evidence of residual breast carcinoma in right breast or  axilla.  2. No evidence of distant nodal metastasis or systemic metastasis.  3. Metabolic activity associated with a large s sigmoid  diverticulum consistent with mild diverticulitis.    ASSESSMENT: 46 year old BRCA 1-2 negative [place] woman  (1) status post left lumpectomy and sentinel lymph node biopsy December 2002 for a 1.9 cm invasive ductal carcinoma, no lymph node involvement, triple negative, treated with 4 cycles of Cytoxan and Adriamycin followed by radiation, with no evidence of disease recurrence to date.  (2) status post Right lumpectomy and axillary lymph node dissection 03/02/2012 for a T2 N2 invasive lobular breast cancer, grade 1, estrogen receptor 85% and progesterone receptor 98% positive, with HER-2 nonamplified and an MIB-1 of 9%; 10 of 10 lymph nodes involved. (3)  receiving adjuvant chemotherapy consisting of 4 planned, every 3 week doses of docetaxel/cyclophosphamide  (4) history of afebrile chemotherapy-induced neutropenia  PLAN: Christina Blackwell continues to tolerate treatment well. We again reviewed neutropenic percussion, and she will start back on her Cipro prophylactically, 500 mg by mouth twice a day x7 days. She knows  to call with any fevers 100 or above.  Otherwise, I will see the patient back in 2 weeks on June 13 and anticipation of her fourth and final dose of adjuvant chemotherapy. She is also scheduled to meet with the therapist at the  lymphedema clinic next week.  She voices understanding and agreement with this plan, and will call with any changes or problems.  Keyona Emrich    05/13/2012

## 2012-05-17 ENCOUNTER — Ambulatory Visit: Payer: 59 | Attending: Physician Assistant | Admitting: Physical Therapy

## 2012-05-17 DIAGNOSIS — M24519 Contracture, unspecified shoulder: Secondary | ICD-10-CM | POA: Insufficient documentation

## 2012-05-17 DIAGNOSIS — IMO0001 Reserved for inherently not codable concepts without codable children: Secondary | ICD-10-CM | POA: Insufficient documentation

## 2012-05-17 DIAGNOSIS — I890 Lymphedema, not elsewhere classified: Secondary | ICD-10-CM | POA: Insufficient documentation

## 2012-05-18 ENCOUNTER — Telehealth: Payer: Self-pay | Admitting: *Deleted

## 2012-05-18 NOTE — Telephone Encounter (Signed)
This RN spoke with pt per her call stating " my insurance company called me and told me I was not curable and therefore qualify for a second opinion and trial studies ".  Per chart review and documentation this RN reinterated her  [facility]

## 2012-05-20 ENCOUNTER — Ambulatory Visit: Payer: 59

## 2012-05-20 ENCOUNTER — Ambulatory Visit: Payer: 59 | Admitting: Physician Assistant

## 2012-05-20 ENCOUNTER — Other Ambulatory Visit: Payer: 59 | Admitting: Lab

## 2012-05-21 ENCOUNTER — Ambulatory Visit: Payer: 59

## 2012-05-22 ENCOUNTER — Other Ambulatory Visit: Payer: Self-pay | Admitting: Oncology

## 2012-05-27 ENCOUNTER — Ambulatory Visit (HOSPITAL_BASED_OUTPATIENT_CLINIC_OR_DEPARTMENT_OTHER): Payer: 59

## 2012-05-27 ENCOUNTER — Encounter: Payer: Self-pay | Admitting: Physician Assistant

## 2012-05-27 ENCOUNTER — Ambulatory Visit (HOSPITAL_BASED_OUTPATIENT_CLINIC_OR_DEPARTMENT_OTHER): Payer: 59 | Admitting: Physician Assistant

## 2012-05-27 ENCOUNTER — Ambulatory Visit: Payer: 59 | Admitting: Oncology

## 2012-05-27 ENCOUNTER — Other Ambulatory Visit (HOSPITAL_BASED_OUTPATIENT_CLINIC_OR_DEPARTMENT_OTHER): Payer: 59 | Admitting: Lab

## 2012-05-27 ENCOUNTER — Other Ambulatory Visit: Payer: 59 | Admitting: Lab

## 2012-05-27 ENCOUNTER — Telehealth: Payer: Self-pay | Admitting: Oncology

## 2012-05-27 VITALS — BP 126/82 | HR 98 | Temp 98.4°F | Ht 64.0 in | Wt 162.6 lb

## 2012-05-27 DIAGNOSIS — Z5111 Encounter for antineoplastic chemotherapy: Secondary | ICD-10-CM

## 2012-05-27 DIAGNOSIS — C773 Secondary and unspecified malignant neoplasm of axilla and upper limb lymph nodes: Secondary | ICD-10-CM

## 2012-05-27 DIAGNOSIS — C50919 Malignant neoplasm of unspecified site of unspecified female breast: Secondary | ICD-10-CM

## 2012-05-27 DIAGNOSIS — Z853 Personal history of malignant neoplasm of breast: Secondary | ICD-10-CM

## 2012-05-27 LAB — CBC WITH DIFFERENTIAL/PLATELET
Basophils Absolute: 0 10*3/uL (ref 0.0–0.1)
Eosinophils Absolute: 0 10*3/uL (ref 0.0–0.5)
HGB: 13.1 g/dL (ref 11.6–15.9)
MCV: 88.8 fL (ref 79.5–101.0)
MONO#: 1.1 10*3/uL — ABNORMAL HIGH (ref 0.1–0.9)
MONO%: 5.9 % (ref 0.0–14.0)
NEUT#: 16.2 10*3/uL — ABNORMAL HIGH (ref 1.5–6.5)
RBC: 4.38 10*6/uL (ref 3.70–5.45)
RDW: 15.7 % — ABNORMAL HIGH (ref 11.2–14.5)
WBC: 18.8 10*3/uL — ABNORMAL HIGH (ref 3.9–10.3)
lymph#: 1.5 10*3/uL (ref 0.9–3.3)
nRBC: 0 % (ref 0–0)

## 2012-05-27 MED ORDER — DOCETAXEL CHEMO INJECTION 160 MG/16ML
75.0000 mg/m2 | Freq: Once | INTRAVENOUS | Status: AC
  Administered 2012-05-27: 140 mg via INTRAVENOUS
  Filled 2012-05-27: qty 14

## 2012-05-27 MED ORDER — DEXAMETHASONE SODIUM PHOSPHATE 4 MG/ML IJ SOLN
20.0000 mg | Freq: Once | INTRAMUSCULAR | Status: AC
  Administered 2012-05-27: 20 mg via INTRAVENOUS

## 2012-05-27 MED ORDER — ONDANSETRON 16 MG/50ML IVPB (CHCC)
16.0000 mg | Freq: Once | INTRAVENOUS | Status: AC
  Administered 2012-05-27: 16 mg via INTRAVENOUS

## 2012-05-27 MED ORDER — SODIUM CHLORIDE 0.9 % IV SOLN
600.0000 mg/m2 | Freq: Once | INTRAVENOUS | Status: AC
  Administered 2012-05-27: 1100 mg via INTRAVENOUS
  Filled 2012-05-27: qty 55

## 2012-05-27 MED ORDER — SODIUM CHLORIDE 0.9 % IV SOLN
Freq: Once | INTRAVENOUS | Status: AC
  Administered 2012-05-27: 13:00:00 via INTRAVENOUS

## 2012-05-27 NOTE — Patient Instructions (Addendum)
[facility] Discharge Instructions for Patients Receiving Chemotherapy  Today you received the following chemotherapy agents Taxotere and Cytoxan.  To help prevent nausea and vomiting after your treatment, we encourage you to take your nausea medication.   If you develop nausea and vomiting that is not controlled by your nausea medication, call the clinic. If it is after clinic hours your family physician or the after hours number for the clinic or go to the Emergency Department.   BELOW ARE SYMPTOMS THAT SHOULD BE REPORTED IMMEDIATELY:  *FEVER GREATER THAN 100.5 F  *CHILLS WITH OR WITHOUT FEVER  NAUSEA AND VOMITING THAT IS NOT CONTROLLED WITH YOUR NAUSEA MEDICATION  *UNUSUAL SHORTNESS OF BREATH  *UNUSUAL BRUISING OR BLEEDING  TENDERNESS IN MOUTH AND THROAT WITH OR WITHOUT PRESENCE OF ULCERS  *URINARY PROBLEMS  *BOWEL PROBLEMS  UNUSUAL RASH Items with * indicate a potential emergency and should be followed up as soon as possible.  One of the nurses will contact you 24 hours after your treatment. Please let the nurse know about any problems that you may have experienced. Feel free to call the clinic you have any questions or concerns. The clinic phone number is [phone].   I have been informed and understand all the instructions given to me. I know to contact the clinic, my physician, or go to the Emergency Department if any problems should occur. I do not have any questions at this time, but understand that I may call the clinic during office hours   should I have any questions or need assistance in obtaining follow up care.    __________________________________________  _____________  __________ Signature of Patient or Authorized Representative            Date                   Time    __________________________________________ Nurse's Signature    

## 2012-05-27 NOTE — Telephone Encounter (Signed)
gve the pt ehr June 2013 appt calendar 

## 2012-05-27 NOTE — Progress Notes (Signed)
ID: Christina Blackwell   DOB: October 14, 1966  MR#: [id number]  CSN#:[id number]  HISTORY OF PRESENT ILLNESS: Christina Blackwell is a 46 year old [place] woman formerly followed here for a left-sided stage I invasive ductal carcinoma, which was treated with adjuvant chemotherapy completed in 2003. She was released from followup last year.  On 12/28/2011 she had bilateral screening mammography showing a possible area of distortion in the right breast. She was recalled for additional views 01/09/2012 and this confirmed an area of architectural distortion in the lateral aspect of the right breast measuring 2.9 cm. This was not palpable by exam. Biopsy was performed the same day and showed ([id number]) and invasive lobular breast cancer, e-cadherin negative, strongly estrogen receptor positive at 85%, progesterone receptor positive at 98%, with an MIB-1 of 9%, and no HER-2 amplification.  Patient is status post right lumpectomy and x-ray lymph node dissection 03/02/2012 for a T2 N2 invasive lobular breast carcinoma, grade 1. 10 of 10 lymph nodes were positive. Margins were close but negative. Her subsequent history is as detailed below    INTERVAL HISTORY: Christina Blackwell returns today for followup of her right breast carcinoma. She is due for day 1 cycle 4, her final dose, of adjuvant docetaxel/cyclophosphamide today.  Interval history is remarkable for a troubling phone call Christina Blackwell received from her insurance company ([employer]) last week when she was told she "qualified for a second opinion" and "research drugs" because she was "incurable". This understandably upset Christina Blackwell quite a bit. Of course she did have local node involvement, but has no evidence of distant metastatic disease.   REVIEW OF SYSTEMS: Overall, Christina Blackwell is feeling well and has a good energy level. She has had no fevers, chills, or night sweats. She denies any nausea, diarrhea, or constipation. She's had no cough, shortness of breath, or chest pain. No abnormal  headaches, dizziness, or change in vision. No unusual myalgias, arthralgias, or bony pain. She continues to have some occasional anxiety but this has improved. She also has some occasional difficulty sleeping which has not changed.  A detailed review of systems is otherwise noncontributory.  PAST MEDICAL HISTORY: Past Medical History  Diagnosis Date  . History of bilateral breast implants   . GERD (gastroesophageal reflux disease)   . Complication of anesthesia   . PONV (postoperative nausea and vomiting)   . History of radiation therapy 03/30/02-05/16/02    left breast   . History of chemotherapy     cyclosphosphamide/ doxorubicin  . Breast cancer 2002    left   . Breast cancer 2012    right  . Anxiety   . Depression   . Glaucoma     PAST SURGICAL HISTORY: Past Surgical History  Procedure Date  . Portacath placement   . Portacath removal   . Tonsillectomy and adenoidectomy   . Breast lumpectomy 11/2001    left  . Sentinel lymph node biopsy 11/2001    left axillary  . Breast enhancement surgery 2003    bilateral  . Breast lumpectomy w/ needle localization 02/05/2012    right/ slnbx  . Mirena iud   . Axillary lymph node dissection 03/02/2012    Procedure: AXILLARY LYMPH NODE DISSECTION;  Surgeon: Kandis Cocking, MD;  Location: [facility];  Service: General;  Laterality: Right;    FAMILY HISTORY Family History  Problem Relation Age of Onset  . Cancer Mother     breast  . Cancer Paternal Aunt     breast  . Cancer Cousin  breast ca 26's 1st cousin    Gynecologic history:  Menarche age 38, she is GX P0. She is currently using a Mirena implant   Social history:  She works for [employer], but her job was recently eliminated, and she is temporarily employed by them at present. She is divorced and lives by herself, with 2 birds, an [place] parrot and a cockateal. 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   ADVANCED DIRECTIVES: in place and  HEALTH  MAINTENANCE: History  Substance Use Topics  . Smoking status: Never Smoker   . Smokeless tobacco: Not on file  . Alcohol Use: Yes     social occasio0nally 1-2      Colonoscopy:  PAP:  Bone density:  Lipid panel:  No Known Allergies  Current Outpatient Prescriptions  Medication Sig Dispense Refill  . cetirizine (ZYRTEC) 10 MG tablet Take 10 mg by mouth as needed.      . ciprofloxacin (CIPRO) 500 MG tablet       . clonazePAM (KLONOPIN) 0.5 MG tablet Take 0.5 mg by mouth 2 (two) times daily as needed. Per patient stated 02/23/12      . dexamethasone (DECADRON) 4 MG tablet       . fluconazole (DIFLUCAN) 100 MG tablet       . ibuprofen (ADVIL,MOTRIN) 200 MG tablet Take 200 mg by mouth every 6 (six) hours as needed.      . lidocaine-prilocaine (EMLA) cream       . LORazepam (ATIVAN) 0.5 MG tablet Take 1 tablet (0.5 mg total) by mouth every 8 (eight) hours as needed for anxiety.  60 tablet  3  . omeprazole (PRILOSEC) 20 MG capsule Take 20 mg by mouth daily.      . ondansetron (ZOFRAN) 8 MG tablet       . prochlorperazine (COMPAZINE) 10 MG tablet Take 1 tablet (10 mg total) by mouth every 6 (six) hours as needed.  30 tablet  3  . ranitidine (ZANTAC) 150 MG tablet Take 150 mg by mouth 2 (two) times daily.      . temazepam (RESTORIL) 15 MG capsule Take 1 capsule (15 mg total) by mouth at bedtime as needed for sleep.  30 capsule  1  . zolpidem (AMBIEN) 5 MG tablet Take 5 mg by mouth at bedtime as needed.        OBJECTIVE: Middle-aged white woman in no acute distress Filed Vitals:   05/27/12 1146  BP: 126/82  Pulse: 98  Temp: 98.4 F (36.9 C)     Body mass index is 27.91 kg/(m^2).    ECOG FS: 1  Filed Weights   05/27/12 1146  Weight: 162 lb 9.6 oz (73.755 kg)   Physical Exam: HEENT:  Sclerae anicteric, conjunctivae pink.  Oropharynx  is benign. No candidiasis. No ulcerations or evidence of mucositis.   Nodes:  No cervical, supraclavicular, or axillary lymphadenopathy palpated.    Breast Exam:   Right breast, status post lumpectomy with well-healed incision. No significant erythema or skin changes, and no evidence of infection or cellulitis in the breast.  Lungs:  Clear to auscultation bilaterally.  No crackles, rhonchi, or wheezes.   Heart:  Regular rate and rhythm.   Abdomen:  Soft, nontender.  Positive bowel sounds.  No organomegaly or masses palpated.   Musculoskeletal:  No focal spinal tenderness to palpation.  Extremities:  Nonpitting edema in the right upper extremity. No additional peripheral edema and no peripheral cyanosis.   Neuro:  Nonfocal. Alert and  oriented x3.    LAB RESULTS: Lab Results  Component Value Date   WBC 18.8* 05/27/2012   NEUTROABS 16.2* 05/27/2012   HGB 13.1 05/27/2012   HCT 38.9 05/27/2012   MCV 88.8 05/27/2012   PLT 273 05/27/2012      Chemistry      Component Value Date/Time   NA 138 04/15/2012 1346   K 3.5 04/15/2012 1346   CL 102 04/15/2012 1346   CO2 27 04/15/2012 1346   BUN 11 04/15/2012 1346   CREATININE 0.64 04/15/2012 1346      Component Value Date/Time   CALCIUM 9.1 04/15/2012 1346   ALKPHOS 60 04/15/2012 1346   AST 14 04/15/2012 1346   ALT 21 04/15/2012 1346   BILITOT 0.4 04/15/2012 1346       Lab Results  Component Value Date   LABCA2 23 02/20/2011     STUDIES:  04/16/2012 NUCLEAR MEDICINE PET SKULL BASE TO THIGH  Fasting Blood Glucose: 92  Technique: 17.8 mCi F-18 FDG was injected intravenously. CT data  was obtained and used for attenuation correction and anatomic  localization only. (This was not acquired as a diagnostic CT  examination.) Additional exam technical data entered on  technologist worksheet.  Comparison: CT thorax 02/26/2012  Findings:  Neck: No hypermetabolic nodes in the neck.  Chest: There is postoperative change in the right breast. There is  mild metabolic activity associated with the right axillary seromas  which is felt to be inflammatory. No hypermetabolic axillary or  supraclavicular lymph  nodes. No hypermetabolic internal mammary  nodes or mediastinal nodes. Bilateral breast prosthetics are  noted. Review lung parenchyma demonstrates no suspicious pulmonary  nodules.  Abdomen / Pelvis:No abnormal hypermetabolic activity within the  solid organs. No evidence of abdominal or pelvic hypermetabolic  nodes.  There is hypermetabolic activity associate with a large 17 mm  diverticulum extending from the proximal sigmoid colon. There is  mild inflammation surrounding the diverticulum.  Skeleton:No focal hypermetabolic activity to suggest skeletal  metastasis.  IMPRESSION:  1. No evidence of residual breast carcinoma in right breast or  axilla.  2. No evidence of distant nodal metastasis or systemic metastasis.  3. Metabolic activity associated with a large s sigmoid  diverticulum consistent with mild diverticulitis.    ASSESSMENT: 46 year old BRCA 1-2 negative [place] woman  (1) status post left lumpectomy and sentinel lymph node biopsy December 2002 for a 1.9 cm invasive ductal carcinoma, no lymph node involvement, triple negative, treated with 4 cycles of Cytoxan and Adriamycin followed by radiation, with no evidence of disease recurrence to date.  (2) status post Right lumpectomy and axillary lymph node dissection 03/02/2012 for a T2 N2 invasive lobular breast cancer, grade 1, estrogen receptor 85% and progesterone receptor 98% positive, with HER-2 nonamplified and an MIB-1 of 9%; 10 of 10 lymph nodes involved. (3)  receiving adjuvant chemotherapy consisting of 4 planned, every 3 week doses of docetaxel/cyclophosphamide  (4) history of afebrile chemotherapy-induced neutropenia  PLAN: Christina Blackwell will proceed to treatment today as scheduled for her fourth and final adjuvant dose of docetaxel/cyclophosphamide. This will be followed by 3 injections of Neupogen given on Friday, Saturday, and Monday. She will then see Dr. Darnelle Catalan next week on June 19 for assessment of chemotoxicity.  She is already scheduled to meet with Dr. Kathrynn Running the following week to initiate radiation therapy. We will be seeing her again in approximately 6-8 weeks to initiate her tamoxifen upon completion of radiation.  Patient voices understanding and agreement  with this plan, and knows to call with any changes or problems.   Tennille Montelongo    05/27/2012

## 2012-05-28 ENCOUNTER — Ambulatory Visit (HOSPITAL_BASED_OUTPATIENT_CLINIC_OR_DEPARTMENT_OTHER): Payer: 59

## 2012-05-28 VITALS — BP 107/70 | HR 75 | Temp 97.1°F

## 2012-05-28 DIAGNOSIS — C773 Secondary and unspecified malignant neoplasm of axilla and upper limb lymph nodes: Secondary | ICD-10-CM

## 2012-05-28 DIAGNOSIS — Z853 Personal history of malignant neoplasm of breast: Secondary | ICD-10-CM

## 2012-05-28 DIAGNOSIS — C50919 Malignant neoplasm of unspecified site of unspecified female breast: Secondary | ICD-10-CM

## 2012-05-28 DIAGNOSIS — Z5189 Encounter for other specified aftercare: Secondary | ICD-10-CM

## 2012-05-28 MED ORDER — FILGRASTIM 300 MCG/0.5ML IJ SOLN
300.0000 ug | Freq: Once | INTRAMUSCULAR | Status: AC
  Administered 2012-05-28: 300 ug via SUBCUTANEOUS
  Filled 2012-05-28: qty 0.5

## 2012-05-29 ENCOUNTER — Ambulatory Visit (HOSPITAL_BASED_OUTPATIENT_CLINIC_OR_DEPARTMENT_OTHER): Payer: 59

## 2012-05-29 VITALS — BP 106/71 | HR 92 | Temp 98.6°F

## 2012-05-29 DIAGNOSIS — C50919 Malignant neoplasm of unspecified site of unspecified female breast: Secondary | ICD-10-CM

## 2012-05-29 DIAGNOSIS — C773 Secondary and unspecified malignant neoplasm of axilla and upper limb lymph nodes: Secondary | ICD-10-CM

## 2012-05-29 DIAGNOSIS — Z5189 Encounter for other specified aftercare: Secondary | ICD-10-CM

## 2012-05-29 MED ORDER — FILGRASTIM 300 MCG/0.5ML IJ SOLN
300.0000 ug | Freq: Once | INTRAMUSCULAR | Status: AC
  Administered 2012-05-29: 300 ug via SUBCUTANEOUS

## 2012-05-31 ENCOUNTER — Telehealth: Payer: Self-pay | Admitting: *Deleted

## 2012-05-31 ENCOUNTER — Ambulatory Visit: Payer: 59 | Admitting: Radiation Oncology

## 2012-05-31 ENCOUNTER — Ambulatory Visit: Payer: 59

## 2012-05-31 NOTE — Telephone Encounter (Signed)
Christina Blackwell returned call and left message with operator that she was coming in for her injection today.   Dr Darnelle Catalan notified of this.

## 2012-05-31 NOTE — Telephone Encounter (Signed)
Sincere missed injection appointment.  Called and left message for her to call back.

## 2012-06-02 ENCOUNTER — Other Ambulatory Visit (HOSPITAL_BASED_OUTPATIENT_CLINIC_OR_DEPARTMENT_OTHER): Payer: 59 | Admitting: Lab

## 2012-06-02 ENCOUNTER — Telehealth: Payer: Self-pay | Admitting: Oncology

## 2012-06-02 ENCOUNTER — Ambulatory Visit (HOSPITAL_BASED_OUTPATIENT_CLINIC_OR_DEPARTMENT_OTHER): Payer: 59 | Admitting: Oncology

## 2012-06-02 VITALS — BP 130/76 | HR 150 | Temp 98.4°F | Ht 64.0 in | Wt 160.8 lb

## 2012-06-02 DIAGNOSIS — C50919 Malignant neoplasm of unspecified site of unspecified female breast: Secondary | ICD-10-CM

## 2012-06-02 DIAGNOSIS — Z853 Personal history of malignant neoplasm of breast: Secondary | ICD-10-CM

## 2012-06-02 DIAGNOSIS — C773 Secondary and unspecified malignant neoplasm of axilla and upper limb lymph nodes: Secondary | ICD-10-CM

## 2012-06-02 DIAGNOSIS — D702 Other drug-induced agranulocytosis: Secondary | ICD-10-CM

## 2012-06-02 LAB — CBC WITH DIFFERENTIAL/PLATELET
Basophils Absolute: 0 10*3/uL (ref 0.0–0.1)
Eosinophils Absolute: 0.1 10*3/uL (ref 0.0–0.5)
HCT: 36.7 % (ref 34.8–46.6)
HGB: 12.8 g/dL (ref 11.6–15.9)
LYMPH%: 61.4 % — ABNORMAL HIGH (ref 14.0–49.7)
MONO#: 0.1 10*3/uL (ref 0.1–0.9)
NEUT#: 0.3 10*3/uL — CL (ref 1.5–6.5)
NEUT%: 21.9 % — ABNORMAL LOW (ref 38.4–76.8)
Platelets: 179 10*3/uL (ref 145–400)
WBC: 1.3 10*3/uL — ABNORMAL LOW (ref 3.9–10.3)

## 2012-06-02 LAB — COMPREHENSIVE METABOLIC PANEL
Albumin: 3.4 g/dL — ABNORMAL LOW (ref 3.5–5.2)
BUN: 16 mg/dL (ref 6–23)
CO2: 23 mEq/L (ref 19–32)
Calcium: 9.1 mg/dL (ref 8.4–10.5)
Chloride: 102 mEq/L (ref 96–112)
Creatinine, Ser: 0.63 mg/dL (ref 0.50–1.10)

## 2012-06-02 NOTE — Progress Notes (Signed)
ID: NANCYJO GIVHAN   DOB: 05/04/1966  MR#: [id number]  CSN#:[id number]  HISTORY OF PRESENT ILLNESS: Saquoia is a 46 year old [place] woman formerly followed here for a left-sided stage I invasive ductal carcinoma, which was treated with adjuvant chemotherapy completed in 2003. She was released from followup last year.  On 12/28/2011 she had bilateral screening mammography showing a possible area of distortion in the right breast. She was recalled for additional views 01/09/2012 and this confirmed an area of architectural distortion in the lateral aspect of the right breast measuring 2.9 cm. This was not palpable by exam. Biopsy was performed the same day and showed ([id number]) and invasive lobular breast cancer, e-cadherin negative, strongly estrogen receptor positive at 85%, progesterone receptor positive at 98%, with an MIB-1 of 9%, and no HER-2 amplification.  Patient is status post right lumpectomy and x-ray lymph node dissection 03/02/2012 for a T2 N2 invasive lobular breast carcinoma, grade 1. 10 of 10 lymph nodes were positive. Margins were close but negative. Her subsequent history is as detailed below  INTERVAL HISTORY:  Christalyn returns today after completing her fourth and final cycle of Cytoxan/Taxotere. Today is day 7 cycle 4.  REVIEW OF SYSTEMS: She tells me she tolerated this much better than she did her prior chemotherapy several years ago. In particular she had no problems with nausea or vomiting. She is having some lymphedema in the right arm, and she is working with our physical therapist regarding that. She's got have some wrapping and then a sleeve. She was able to work right through her treatments, missing perhaps 4 days every 3 weeks. Her energy level is good and is getting better she tells me. She still has soreness and pain in the surgical site which she describes as mild to moderate but overall aggravating. Her vision is not perfect, which is not uncommon at this point in the  treatment cycle. Her appetite is poor although she has not lost any weight she is having some hot flashes overall though a detailed review of systems today was noncontributory.  PAST MEDICAL HISTORY: Past Medical History  Diagnosis Date  . History of bilateral breast implants   . GERD (gastroesophageal reflux disease)   . Complication of anesthesia   . PONV (postoperative nausea and vomiting)   . History of radiation therapy 03/30/02-05/16/02    left breast   . History of chemotherapy     cyclosphosphamide/ doxorubicin  . Breast cancer 2002    left   . Breast cancer 2012    right  . Anxiety   . Depression   . Glaucoma     PAST SURGICAL HISTORY: Past Surgical History  Procedure Date  . Portacath placement   . Portacath removal   . Tonsillectomy and adenoidectomy   . Breast lumpectomy 11/2001    left  . Sentinel lymph node biopsy 11/2001    left axillary  . Breast enhancement surgery 2003    bilateral  . Breast lumpectomy w/ needle localization 02/05/2012    right/ slnbx  . Mirena iud   . Axillary lymph node dissection 03/02/2012    Procedure: AXILLARY LYMPH NODE DISSECTION;  Surgeon: Kandis Cocking, MD;  Location: [facility];  Service: General;  Laterality: Right;    FAMILY HISTORY Family History  Problem Relation Age of Onset  . Cancer Mother     breast  . Cancer Paternal Aunt     breast  . Cancer Cousin     breast ca 34's 1st  cousin    Gynecologic history:  Menarche age 49, she is GX P0. She is currently using a Mirena implant  and is considering bilateral salpingo-oophorectomy.  Social history:  She works for [employer], but her job was recently eliminated, and she is temporarily employed by them at present. She is divorced and lives by herself, with 2 birds, an [place] parrot and a cockateal. 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   ADVANCED DIRECTIVES: in place   HEALTH MAINTENANCE: History  Substance Use Topics  . Smoking status: Never  Smoker   . Smokeless tobacco: Not on file  . Alcohol Use: Yes     social occasio0nally 1-2      Colonoscopy:  PAP:  Bone density:  Lipid panel:  No Known Allergies  Current Outpatient Prescriptions  Medication Sig Dispense Refill  . cetirizine (ZYRTEC) 10 MG tablet Take 10 mg by mouth as needed.      Marland Kitchen dexamethasone (DECADRON) 4 MG tablet       . ibuprofen (ADVIL,MOTRIN) 200 MG tablet Take 200 mg by mouth every 6 (six) hours as needed.      . lidocaine-prilocaine (EMLA) cream       . LORazepam (ATIVAN) 0.5 MG tablet Take 1 tablet (0.5 mg total) by mouth every 8 (eight) hours as needed for anxiety.  60 tablet  3  . omeprazole (PRILOSEC) 20 MG capsule Take 20 mg by mouth daily.      . ondansetron (ZOFRAN) 8 MG tablet       . ranitidine (ZANTAC) 150 MG tablet Take 150 mg by mouth 2 (two) times daily.      Marland Kitchen zolpidem (AMBIEN) 5 MG tablet Take 5 mg by mouth at bedtime as needed.      . ciprofloxacin (CIPRO) 500 MG tablet       . clonazePAM (KLONOPIN) 0.5 MG tablet Take 0.5 mg by mouth 2 (two) times daily as needed. Per patient stated 02/23/12      . fluconazole (DIFLUCAN) 100 MG tablet       . prochlorperazine (COMPAZINE) 10 MG tablet Take 1 tablet (10 mg total) by mouth every 6 (six) hours as needed.  30 tablet  3  . temazepam (RESTORIL) 15 MG capsule Take 1 capsule (15 mg total) by mouth at bedtime as needed for sleep.  30 capsule  1    OBJECTIVE: Middle-aged white woman who appears mildly anxious Filed Vitals:   06/02/12 1105  BP: 130/76  Pulse: 150  Temp: 98.4 F (36.9 C)     Body mass index is 27.60 kg/(m^2).    ECOG FS: 1  Filed Weights   06/02/12 1105  Weight: 160 lb 12.8 oz (72.938 kg)    Sclerae unicteric Oropharynx clear No cervical or supraclavicular adenopathy Lungs no rales or rhonchi Heart regular rate and rhythm Abd benign MSK no focal spinal tenderness, no peripheral edema Neuro: nonfocal Breasts: Both breasts are status post lumpectomy and there is no  evidence of local recurrence in either breast. The axillae are benign bilaterally.  LAB RESULTS: Lab Results  Component Value Date   WBC 1.3* 06/02/2012   NEUTROABS 0.3* 06/02/2012   HGB 12.8 06/02/2012   HCT 36.7 06/02/2012   MCV 85.9 06/02/2012   PLT 179 06/02/2012      Chemistry      Component Value Date/Time   NA 136 06/02/2012 1049   K 4.1 06/02/2012 1049   CL 102 06/02/2012 1049   CO2 23 06/02/2012 1049  BUN 16 06/02/2012 1049   CREATININE 0.63 06/02/2012 1049      Component Value Date/Time   CALCIUM 9.1 06/02/2012 1049   ALKPHOS 64 06/02/2012 1049   AST 19 06/02/2012 1049   ALT 31 06/02/2012 1049   BILITOT 0.4 06/02/2012 1049       Lab Results  Component Value Date   LABCA2 23 02/20/2011     STUDIES:  04/16/2012 NUCLEAR MEDICINE PET SKULL BASE TO THIGH  Fasting Blood Glucose: 92  Technique: 17.8 mCi F-18 FDG was injected intravenously. CT data  was obtained and used for attenuation correction and anatomic  localization only. (This was not acquired as a diagnostic CT  examination.) Additional exam technical data entered on  technologist worksheet.  Comparison: CT thorax 02/26/2012  Findings:  Neck: No hypermetabolic nodes in the neck.  Chest: There is postoperative change in the right breast. There is  mild metabolic activity associated with the right axillary seromas  which is felt to be inflammatory. No hypermetabolic axillary or  supraclavicular lymph nodes. No hypermetabolic internal mammary  nodes or mediastinal nodes. Bilateral breast prosthetics are  noted. Review lung parenchyma demonstrates no suspicious pulmonary  nodules.  Abdomen / Pelvis:No abnormal hypermetabolic activity within the  solid organs. No evidence of abdominal or pelvic hypermetabolic  nodes.  There is hypermetabolic activity associate with a large 17 mm  diverticulum extending from the proximal sigmoid colon. There is  mild inflammation surrounding the diverticulum.  Skeleton:No focal  hypermetabolic activity to suggest skeletal  metastasis.  IMPRESSION:  1. No evidence of residual breast carcinoma in right breast or  axilla.  2. No evidence of distant nodal metastasis or systemic metastasis.  3. Metabolic activity associated with a large s sigmoid  diverticulum consistent with mild diverticulitis.    ASSESSMENT: 46 y.o.  BRCA 1-2 negative [place] woman  (1) status post left lumpectomy and sentinel lymph node biopsy December 2002 for a 1.9 cm invasive ductal carcinoma, no lymph node involvement, triple negative, treated with 4 cycles of Cytoxan and Adriamycin followed by radiation, with no evidence of disease recurrence to date.  (2) status post Right lumpectomy and axillary lymph node dissection 03/02/2012 for a T2 N2 invasive lobular breast cancer, grade 1, estrogen receptor 85% and progesterone receptor 98% positive, with HER-2 nonamplified and an MIB-1 of 9%; 10 of 10 lymph nodes involved. (3)  completed 4 cycles of docetaxel/cyclophosphamide 05/27/2012   PLAN: She did well with her chemotherapy and is now ready to consider radiation. She is going to take ciprofloxacin for the next 5 days just to make sure she does not develop a fever while neutropenic. She is going to see [place] again in one month of and then again 2 months from now. I think Eshika will of benefit from increased of support during this transition period of before we resume routine followup on a every 3 month basis. Overall I am delighted that she tolerated the chemotherapy so well. She knows to call for any problems that may develop before the next visit  She tells me her gynecologist cannot locate the Mirena to remove it. She is also wondering if she should have both her ovaries removed. She would like to get a second opinion regarding these things and we have placed a referral today to try to facilitate that.   Amilio Zehnder C    06/02/2012

## 2012-06-02 NOTE — Telephone Encounter (Signed)
gve the pt her July 2013 appt calendar. Per pt she will call dr Finis Bud herself to set up the appt. Per pt she needs to wait a couple of weeks after she finishes with rad

## 2012-06-03 ENCOUNTER — Other Ambulatory Visit: Payer: 59 | Admitting: Lab

## 2012-06-03 ENCOUNTER — Ambulatory Visit: Payer: 59 | Admitting: Physician Assistant

## 2012-06-07 ENCOUNTER — Encounter: Payer: 59 | Admitting: Physical Therapy

## 2012-06-08 ENCOUNTER — Ambulatory Visit: Payer: 59 | Admitting: Physical Therapy

## 2012-06-08 NOTE — Progress Notes (Signed)
Patient diagnosed initially in 2003 with stage 1 left breast cancer treated with adjuvant chemotherapy.Presenting now status post right breast lumpectomy and chemotherapy of invasive lobular grade 1 cancer which is ERPR+.

## 2012-06-09 ENCOUNTER — Ambulatory Visit
Admission: RE | Admit: 2012-06-09 | Discharge: 2012-06-09 | Disposition: A | Discharge status: Home / Self Care | Payer: 59 | Source: Ambulatory Visit | Attending: Radiation Oncology | Admitting: Radiation Oncology

## 2012-06-09 ENCOUNTER — Encounter: Payer: Self-pay | Admitting: Radiation Oncology

## 2012-06-09 VITALS — BP 140/95 | HR 90 | Temp 97.3°F | Wt 162.1 lb

## 2012-06-09 DIAGNOSIS — C50919 Malignant neoplasm of unspecified site of unspecified female breast: Secondary | ICD-10-CM

## 2012-06-09 NOTE — Progress Notes (Signed)
  Radiation Oncology         [phone] ________________________________  Name: Christina Blackwell MRN: [id number]  Date: 06/09/2012  DOB: 23-Mar-1966  Follow-Up Visit Note  CC: Provider Not In System  Magrinat, Valentino Hue, MD  Diagnosis:  46 year old woman with stage T2 N2 (10/10 lymph nodes) M0  grade 1 invasive lobular breast carcinoma,   Narrative:  The patient returns today for routine follow-up.  I saw her to discuss her new diagnosis of right breast cancer in March. Since that time, she has completed systemic chemotherapy and is ready to proceed with right breast and regional lymph node radiotherapy                              ALLERGIES:   has no known allergies.  Meds: Current Outpatient Prescriptions  Medication Sig Dispense Refill  . cetirizine (ZYRTEC) 10 MG tablet Take 10 mg by mouth as needed.      . clonazePAM (KLONOPIN) 0.5 MG tablet Take 0.5 mg by mouth 2 (two) times daily as needed. Per patient stated 02/23/12      . diphenhydrAMINE (SOMINEX) 25 MG tablet Take 25 mg by mouth 4 (four) times daily as needed.      Marland Kitchen ibuprofen (ADVIL,MOTRIN) 200 MG tablet Take 200 mg by mouth every 6 (six) hours as needed.      Marland Kitchen LORazepam (ATIVAN) 0.5 MG tablet Take 1 tablet (0.5 mg total) by mouth every 8 (eight) hours as needed for anxiety.  60 tablet  3  . omeprazole (PRILOSEC) 20 MG capsule Take 20 mg by mouth daily.      Marland Kitchen zolpidem (AMBIEN) 5 MG tablet Take 5 mg by mouth at bedtime as needed.      . ondansetron (ZOFRAN) 8 MG tablet       . prochlorperazine (COMPAZINE) 10 MG tablet Take 1 tablet (10 mg total) by mouth every 6 (six) hours as needed.  30 tablet  3  . temazepam (RESTORIL) 15 MG capsule Take 1 capsule (15 mg total) by mouth at bedtime as needed for sleep.  30 capsule  1    Physical Findings: The patient is in no acute distress. Patient is alert and oriented.  weight is 162 lb 1.6 oz (73.528 kg). Her temperature is 97.3 F (36.3 C). Her blood pressure is 140/95 and her  pulse is 90. .  The right breast is notable for a wall intended well-healed surgical incision in the lower outer quadrant as well as a well-healed axillary incision. No significant changes.  Lab Findings: Lab Results  Component Value Date   WBC 1.3* 06/02/2012   HGB 12.8 06/02/2012   HCT 36.7 06/02/2012   MCV 85.9 06/02/2012   PLT 179 06/02/2012   Impression:  The patient is has completed adjuvant chemotherapy for her right breast cancer and is ready to proce ed with breast conserving radiotherapy to the breast and regional lymph nodes to lower her risk for local and regional recurrence.  Plan:  Today, I spent time updating our records with Christina Blackwell and we discussed the logistics and deliver radiation as well as anticipated acute and late sequelae. She is ready to proceed and was set up for CT simulation on Friday, July 5.  _____________________________________  Artist Pais. Kathrynn Running, M.D.

## 2012-06-09 NOTE — Progress Notes (Signed)
Patient completed 4 th round of chemotherapy on May 27, 2012 for newly diagnosed right breast  lobular ca. ER+PR+Tolerated relatively well. Right arm lymphedema developed in May of 2013.Currently goes to physical therapy for bandaging every other day. Main concern today is development of itching all over since Saturday 06/05/12.Takes benadryl 25 mg every 4 hours atc without much relief.

## 2012-06-09 NOTE — Progress Notes (Signed)
Please see the Nurse Progress Note in the MD Initial Consult Encounter for this patient. 

## 2012-06-10 ENCOUNTER — Ambulatory Visit: Payer: 59 | Admitting: Physical Therapy

## 2012-06-14 ENCOUNTER — Ambulatory Visit
Admission: RE | Admit: 2012-06-14 | Discharge: 2012-06-14 | Disposition: A | Discharge status: Home / Self Care | Payer: 59 | Source: Ambulatory Visit | Attending: Radiation Oncology | Admitting: Radiation Oncology

## 2012-06-14 ENCOUNTER — Encounter: Payer: Self-pay | Admitting: Radiation Oncology

## 2012-06-14 DIAGNOSIS — C50919 Malignant neoplasm of unspecified site of unspecified female breast: Secondary | ICD-10-CM

## 2012-06-14 NOTE — Progress Notes (Signed)
Met with patient to discuss RO billing.  Dx: 174.5 Lower-outer quadrant of breast  Attending Rad:  Dr. Landis Gandy Tx: [id number] Extrl Beam

## 2012-06-14 NOTE — Progress Notes (Signed)
  Radiation Oncology         [phone] ________________________________  Name: SANIYYAH ELSTER MRN: [id number]  Date: 06/14/2012  DOB: 15-Mar-1966  SIMULATION AND TREATMENT PLANNING NOTE  DIAGNOSIS:  46 year old woman with stage T2 N2 (10/10 lymph nodes) M0 grade 1 invasive lobular breast carcinoma  NARRATIVE:  The patient was brought to the CT Simulation planning suite.  Identity was confirmed.  All relevant records and images related to the planned course of therapy were reviewed.  The patient freely provided informed written consent to proceed with treatment after reviewing the details related to the planned course of therapy. The consent form was witnessed and verified by the simulation staff.  Then, the patient was set-up in a stable reproducible  supine position for radiation therapy.  CT images were obtained.  Surface markings were placed.  The CT images were loaded into the planning software.  Then the target and avoidance structures were contoured.  Treatment planning then occurred.  The radiation prescription was entered and confirmed.  A total of 5 complex treatment devices were fabricated. These included an accuform head positioner for custom placement of the head and neck within the breast body positioning device.  Also, for complex treatment devices in the form of multileaf collimator aperture as were fabricated today to shape radiation around the breast and regional lymph nodes using the standard 4 field beam technique with a left anterior oblique, right posterior oblique PAB, medial tangent and lateral tangent field. I have requested : Isodose Plan.   PLAN:  The patient will receive 60.4 Gy in 33 fraction.  ________________________________  Artist Pais Kathrynn Running, M.D.

## 2012-06-16 ENCOUNTER — Ambulatory Visit: Payer: 59 | Attending: Physician Assistant

## 2012-06-16 DIAGNOSIS — I890 Lymphedema, not elsewhere classified: Secondary | ICD-10-CM | POA: Insufficient documentation

## 2012-06-16 DIAGNOSIS — M25619 Stiffness of unspecified shoulder, not elsewhere classified: Secondary | ICD-10-CM | POA: Insufficient documentation

## 2012-06-16 DIAGNOSIS — IMO0001 Reserved for inherently not codable concepts without codable children: Secondary | ICD-10-CM | POA: Insufficient documentation

## 2012-06-16 DIAGNOSIS — M24519 Contracture, unspecified shoulder: Secondary | ICD-10-CM | POA: Insufficient documentation

## 2012-06-16 DIAGNOSIS — M25519 Pain in unspecified shoulder: Secondary | ICD-10-CM | POA: Insufficient documentation

## 2012-06-21 ENCOUNTER — Ambulatory Visit: Payer: 59 | Admitting: Physical Therapy

## 2012-06-21 ENCOUNTER — Ambulatory Visit
Admission: RE | Admit: 2012-06-21 | Discharge: 2012-06-21 | Disposition: A | Discharge status: Home / Self Care | Payer: 59 | Source: Ambulatory Visit | Attending: Radiation Oncology | Admitting: Radiation Oncology

## 2012-06-21 ENCOUNTER — Encounter: Payer: Self-pay | Admitting: Radiation Oncology

## 2012-06-21 NOTE — Progress Notes (Signed)
Simulation verification note:  The patient was taken to the treatment unit. Earlier, her MLCs were verified with comparison to her DRRs. She was simulation verification for treatment to her right breast and regional lymph nodes. Her isocenter is in good position and the multileaf collimators contoured the treatment volume appropriately.

## 2012-06-22 ENCOUNTER — Ambulatory Visit
Admission: RE | Admit: 2012-06-22 | Discharge: 2012-06-22 | Disposition: A | Discharge status: Home / Self Care | Payer: 59 | Source: Ambulatory Visit | Attending: Radiation Oncology | Admitting: Radiation Oncology

## 2012-06-22 VITALS — BP 105/76 | HR 85 | Temp 98.6°F | Wt 159.0 lb

## 2012-06-22 DIAGNOSIS — C50919 Malignant neoplasm of unspecified site of unspecified female breast: Secondary | ICD-10-CM

## 2012-06-22 MED ORDER — RADIAPLEXRX EX GEL
Freq: Once | CUTANEOUS | Status: AC
  Administered 2012-06-22: 1 via TOPICAL

## 2012-06-22 MED ORDER — ALRA NON-METALLIC DEODORANT (RAD-ONC)
1.0000 "application " | Freq: Once | TOPICAL | Status: AC
  Administered 2012-06-22: 1 via TOPICAL

## 2012-06-22 NOTE — Progress Notes (Signed)
Here for routine weekly under treat visit for right supraclavicular radiation.l Currently under going physical therapy for right arm lymphedema.Completed 1 of 28 treatments. Teaching reinforced as patient has had radiation in 2003.Denies pain. Given radiaplex and alra deodorant as well as teaching information.Patient able to verbalize 2 side effects of radiation.

## 2012-06-22 NOTE — Progress Notes (Signed)
Weekly Management Note:  Site:R Breast/regional LNs Current Dose:  180  cGy Projected Dose: 6040  cGy  Narrative: The patient is seen today for routine under treatment assessment. CBCT/MVCT images/port films were reviewed. The chart was reviewed.   She is without complaints today. She has been visiting physical therapy and is currently wrapped in bandages for her right upper Jimmy lymphedema. She underwent the patient education her today and was given Radioplex gel to use when necessary.  Physical Examination:  Filed Vitals:   06/22/12 1626  BP: 105/76  Pulse: 85  Temp: 98.6 F (37 C)  .  Weight: 159 lb (72.122 kg). No significant skin changes. Her right upper extremity is wrapped in bandages for her lymphedema.  Impression: Tolerating radiation therapy well.  Plan: Continue radiation therapy as planned.

## 2012-06-23 ENCOUNTER — Ambulatory Visit: Payer: 59 | Admitting: Physical Therapy

## 2012-06-23 ENCOUNTER — Ambulatory Visit
Admission: RE | Admit: 2012-06-23 | Discharge: 2012-06-23 | Disposition: A | Discharge status: Home / Self Care | Payer: 59 | Source: Ambulatory Visit | Attending: Radiation Oncology | Admitting: Radiation Oncology

## 2012-06-24 ENCOUNTER — Ambulatory Visit
Admission: RE | Admit: 2012-06-24 | Discharge: 2012-06-24 | Disposition: A | Discharge status: Home / Self Care | Payer: 59 | Source: Ambulatory Visit | Attending: Radiation Oncology | Admitting: Radiation Oncology

## 2012-06-25 ENCOUNTER — Ambulatory Visit: Payer: 59 | Admitting: Physical Therapy

## 2012-06-25 ENCOUNTER — Ambulatory Visit
Admission: RE | Admit: 2012-06-25 | Discharge: 2012-06-25 | Disposition: A | Discharge status: Home / Self Care | Payer: 59 | Source: Ambulatory Visit | Attending: Radiation Oncology | Admitting: Radiation Oncology

## 2012-06-28 ENCOUNTER — Ambulatory Visit: Payer: 59 | Admitting: Physical Therapy

## 2012-06-28 ENCOUNTER — Ambulatory Visit: Admission: RE | Admit: 2012-06-28 | Payer: 59 | Source: Ambulatory Visit

## 2012-06-29 ENCOUNTER — Ambulatory Visit
Admission: RE | Admit: 2012-06-29 | Discharge: 2012-06-29 | Disposition: A | Discharge status: Home / Self Care | Payer: 59 | Source: Ambulatory Visit | Attending: Radiation Oncology | Admitting: Radiation Oncology

## 2012-06-30 ENCOUNTER — Ambulatory Visit (HOSPITAL_BASED_OUTPATIENT_CLINIC_OR_DEPARTMENT_OTHER): Payer: 59 | Admitting: Physician Assistant

## 2012-06-30 ENCOUNTER — Encounter: Payer: Self-pay | Admitting: Physician Assistant

## 2012-06-30 ENCOUNTER — Telehealth: Payer: Self-pay | Admitting: Oncology

## 2012-06-30 ENCOUNTER — Ambulatory Visit
Admission: RE | Admit: 2012-06-30 | Discharge: 2012-06-30 | Disposition: A | Discharge status: Home / Self Care | Payer: 59 | Source: Ambulatory Visit | Attending: Radiation Oncology | Admitting: Radiation Oncology

## 2012-06-30 ENCOUNTER — Other Ambulatory Visit (HOSPITAL_BASED_OUTPATIENT_CLINIC_OR_DEPARTMENT_OTHER): Payer: 59 | Admitting: Lab

## 2012-06-30 ENCOUNTER — Ambulatory Visit: Payer: 59 | Admitting: Physical Therapy

## 2012-06-30 VITALS — BP 110/72 | HR 74 | Temp 98.7°F | Ht 64.0 in | Wt 160.1 lb

## 2012-06-30 DIAGNOSIS — C50919 Malignant neoplasm of unspecified site of unspecified female breast: Secondary | ICD-10-CM

## 2012-06-30 DIAGNOSIS — Z853 Personal history of malignant neoplasm of breast: Secondary | ICD-10-CM

## 2012-06-30 DIAGNOSIS — C773 Secondary and unspecified malignant neoplasm of axilla and upper limb lymph nodes: Secondary | ICD-10-CM

## 2012-06-30 DIAGNOSIS — F411 Generalized anxiety disorder: Secondary | ICD-10-CM

## 2012-06-30 DIAGNOSIS — F419 Anxiety disorder, unspecified: Secondary | ICD-10-CM

## 2012-06-30 LAB — CBC WITH DIFFERENTIAL/PLATELET
BASO%: 0.4 % (ref 0.0–2.0)
Basophils Absolute: 0 10*3/uL (ref 0.0–0.1)
EOS%: 13.1 % — ABNORMAL HIGH (ref 0.0–7.0)
HGB: 13.6 g/dL (ref 11.6–15.9)
MCH: 30.7 pg (ref 25.1–34.0)
RDW: 16.2 % — ABNORMAL HIGH (ref 11.2–14.5)
WBC: 5.6 10*3/uL (ref 3.9–10.3)
lymph#: 0.8 10*3/uL — ABNORMAL LOW (ref 0.9–3.3)

## 2012-06-30 LAB — COMPREHENSIVE METABOLIC PANEL
ALT: 14 U/L (ref 0–35)
AST: 16 U/L (ref 0–37)
Albumin: 4.1 g/dL (ref 3.5–5.2)
Calcium: 9.3 mg/dL (ref 8.4–10.5)
Chloride: 108 mEq/L (ref 96–112)
Potassium: 4 mEq/L (ref 3.5–5.3)

## 2012-06-30 MED ORDER — ZOLPIDEM TARTRATE 5 MG PO TABS: 5.0000 mg | ORAL_TABLET | Freq: Every evening | ORAL | Status: DC | PRN

## 2012-06-30 MED ORDER — LORAZEPAM 0.5 MG PO TABS: 0.5000 mg | ORAL_TABLET | Freq: Two times a day (BID) | ORAL | Status: DC | PRN

## 2012-06-30 NOTE — Progress Notes (Signed)
ID: TASHYRA ADDUCI   DOB: 09/20/1966  MR#: [id number]  CSN#:[id number]  HISTORY OF PRESENT ILLNESS: Christina Blackwell is a 46 year old [place] woman formerly followed here for a left-sided stage I invasive ductal carcinoma, which was treated with adjuvant chemotherapy completed in 2003. She was released from followup last year.  On 12/28/2011 she had bilateral screening mammography showing a possible area of distortion in the right breast. She was recalled for additional views 01/09/2012 and this confirmed an area of architectural distortion in the lateral aspect of the right breast measuring 2.9 cm. This was not palpable by exam. Biopsy was performed the same day and showed ([id number]) and invasive lobular breast cancer, e-cadherin negative, strongly estrogen receptor positive at 85%, progesterone receptor positive at 98%, with an MIB-1 of 9%, and no HER-2 amplification.  Patient is status post right lumpectomy and x-ray lymph node dissection 03/02/2012 for a T2 N2 invasive lobular breast carcinoma, grade 1. 10 of 10 lymph nodes were positive. Margins were close but negative. Her subsequent history is as detailed below  INTERVAL HISTORY:  Christina Blackwell returns today for followup of her right breast carcinoma. She is now underactive radiation therapy which she is tolerating well breast far. Interval history is remarkable for her undergoing therapy through lymphedema clinic for her right upper extremity. She is seeing very slow improvement and is a little frustrated, but understands that this can be a slow process. She continues to be extremely anxious and slightly depressed although she denies suicidal ideations. She's having a difficult time sleeping.  Over half of our 30 minute appointment today was spent discussing these issues, counseling the patient regarding her prognosis, and coordinating care.  REVIEW OF SYSTEMS: Otherwise, Christina Blackwell has had no recent illnesses and denies fevers or chills. She is having increased  hot flashes, and has had no menstrual cycles. No signs of abnormal bleeding. No nausea or change in bowel habits. No cough, shortness of breath, or chest pain. No abnormal headaches or dizziness. No unusual myalgias, arthralgias, peripheral swelling, or peripheral neuropathy.    A detailed review of systems is otherwise noncontributory.   PAST MEDICAL HISTORY: Past Medical History  Diagnosis Date  . History of bilateral breast implants   . GERD (gastroesophageal reflux disease)   . Complication of anesthesia   . PONV (postoperative nausea and vomiting)   . History of radiation therapy 03/30/02-05/16/02    left breast   . History of chemotherapy     cyclosphosphamide/ doxorubicin  . Breast cancer 2002    left   . Breast cancer 2012    right  . Anxiety   . Depression   . History of chemotherapy 4th:June 13,2013    taxol    PAST SURGICAL HISTORY: Past Surgical History  Procedure Date  . Portacath placement   . Portacath removal   . Tonsillectomy and adenoidectomy   . Breast lumpectomy 11/2001    left  . Sentinel lymph node biopsy 11/2001    left axillary  . Breast enhancement surgery 2003    bilateral  . Breast lumpectomy w/ needle localization 02/05/2012    right/ slnbx  . Mirena iud     still in place as of 06/09/12  . Axillary lymph node dissection 03/02/2012    Procedure: AXILLARY LYMPH NODE DISSECTION;  Surgeon: Kandis Cocking, MD;  Location: [facility];  Service: General;  Laterality: Right;    FAMILY HISTORY Family History  Problem Relation Age of Onset  . Cancer Mother  breast  . Cancer Paternal Aunt     breast  . Cancer Cousin     breast ca 49's 1st cousin    Gynecologic history:  Menarche age 49, she is GX P0. She is currently using a Mirena implant  and is considering bilateral salpingo-oophorectomy.  Social history:  She works for [employer], but her job was recently eliminated, and she is temporarily employed by them at  present. She is divorced and lives by herself, with 2 birds, an [place] parrot and a cockateal. 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   ADVANCED DIRECTIVES: in place   HEALTH MAINTENANCE: History  Substance Use Topics  . Smoking status: Never Smoker   . Smokeless tobacco: Not on file  . Alcohol Use: Yes     social occasio0nally 1-2      Colonoscopy:  PAP:  Bone density:  Lipid panel:  No Known Allergies  Current Outpatient Prescriptions  Medication Sig Dispense Refill  . cetirizine (ZYRTEC) 10 MG tablet Take 10 mg by mouth as needed.      . diphenhydrAMINE (SOMINEX) 25 MG tablet Take 25 mg by mouth 4 (four) times daily as needed.      Marland Kitchen ibuprofen (ADVIL,MOTRIN) 200 MG tablet Take 200 mg by mouth every 6 (six) hours as needed.      Marland Kitchen omeprazole (PRILOSEC) 20 MG capsule Take 20 mg by mouth daily.      Marland Kitchen zolpidem (AMBIEN) 5 MG tablet Take 1 tablet (5 mg total) by mouth at bedtime as needed.  30 tablet  0  . DISCONTD: zolpidem (AMBIEN) 5 MG tablet Take 5 mg by mouth at bedtime as needed.      . clonazePAM (KLONOPIN) 0.5 MG tablet Take 0.5 mg by mouth 2 (two) times daily as needed. Per patient stated 02/23/12      . LORazepam (ATIVAN) 0.5 MG tablet Take 1 tablet (0.5 mg total) by mouth 2 (two) times daily as needed for anxiety.  45 tablet  0  . ondansetron (ZOFRAN) 8 MG tablet       . prochlorperazine (COMPAZINE) 10 MG tablet Take 1 tablet (10 mg total) by mouth every 6 (six) hours as needed.  30 tablet  3  . temazepam (RESTORIL) 15 MG capsule Take 1 capsule (15 mg total) by mouth at bedtime as needed for sleep.  30 capsule  1    OBJECTIVE: Middle-aged white woman who appears mildly anxious, but in no acute distress. Filed Vitals:   06/30/12 1021  BP: 110/72  Pulse: 74  Temp: 98.7 F (37.1 C)     Body mass index is 27.48 kg/(m^2).    ECOG FS: 1  Filed Weights   06/30/12 1021  Weight: 160 lb 1.6 oz (72.621 kg)   Physical Exam: Remainder of physical exam was deferred today.  LAB  RESULTS: Lab Results  Component Value Date   WBC 5.6 06/30/2012   NEUTROABS 3.5 06/30/2012   HGB 13.6 06/30/2012   HCT 40.2 06/30/2012   MCV 91.0 06/30/2012   PLT 213 06/30/2012      Chemistry      Component Value Date/Time   NA 140 06/30/2012 0841   K 4.0 06/30/2012 0841   CL 108 06/30/2012 0841   CO2 25 06/30/2012 0841   BUN 11 06/30/2012 0841   CREATININE 0.75 06/30/2012 0841      Component Value Date/Time   CALCIUM 9.3 06/30/2012 0841   ALKPHOS 47 06/30/2012 0841   AST 16 06/30/2012 0841  ALT 14 06/30/2012 0841   BILITOT 0.5 06/30/2012 0841       Lab Results  Component Value Date   LABCA2 23 02/20/2011     STUDIES:  04/16/2012 NUCLEAR MEDICINE PET SKULL BASE TO THIGH  Fasting Blood Glucose: 92  Technique: 17.8 mCi F-18 FDG was injected intravenously. CT data  was obtained and used for attenuation correction and anatomic  localization only. (This was not acquired as a diagnostic CT  examination.) Additional exam technical data entered on  technologist worksheet.  Comparison: CT thorax 02/26/2012  Findings:  Neck: No hypermetabolic nodes in the neck.  Chest: There is postoperative change in the right breast. There is  mild metabolic activity associated with the right axillary seromas  which is felt to be inflammatory. No hypermetabolic axillary or  supraclavicular lymph nodes. No hypermetabolic internal mammary  nodes or mediastinal nodes. Bilateral breast prosthetics are  noted. Review lung parenchyma demonstrates no suspicious pulmonary  nodules.  Abdomen / Pelvis:No abnormal hypermetabolic activity within the  solid organs. No evidence of abdominal or pelvic hypermetabolic  nodes.  There is hypermetabolic activity associate with a large 17 mm  diverticulum extending from the proximal sigmoid colon. There is  mild inflammation surrounding the diverticulum.  Skeleton:No focal hypermetabolic activity to suggest skeletal  metastasis.  IMPRESSION:  1. No evidence of  residual breast carcinoma in right breast or  axilla.  2. No evidence of distant nodal metastasis or systemic metastasis.  3. Metabolic activity associated with a large s sigmoid  diverticulum consistent with mild diverticulitis.    ASSESSMENT: 46 y.o.  BRCA 1-2 negative [place] woman  (1) status post left lumpectomy and sentinel lymph node biopsy December 2002 for a 1.9 cm invasive ductal carcinoma, no lymph node involvement, triple negative, treated with 4 cycles of Cytoxan and Adriamycin followed by radiation, with no evidence of disease recurrence to date.  (2) status post Right lumpectomy and axillary lymph node dissection 03/02/2012 for a T2 N2 invasive lobular breast cancer, grade 1, estrogen receptor 85% and progesterone receptor 98% positive, with HER-2 nonamplified and an MIB-1 of 9%; 10 of 10 lymph nodes involved. (3)  completed 4 cycles of docetaxel/cyclophosphamide 05/27/2012 (4)  currently receiving active radiation therapy, which will be followed by oral anti-estrogen therapy (5)  lymphedema, right upper extremity   PLAN: Christina Blackwell is tolerating radiation well as far and will continue with her regular schedule. I have refilled her lorazepam and her Ambien. We discussed the possibility of an antidepressant such as Celexa, but she has "tried a lot of them" in the past, and did not like how she felt on the medication. Accordingly, she declines an antidepressant. She has agreed, however, to some counseling. I have passed on her contact information to our chaplain, [employer]. She is also aware of the support groups here at the cancer Center.  I will see Christina Blackwell back in approximately 3 weeks, then again in 6 weeks when she completes radiation. At that time we will be ready to discuss tamoxifen.  She voices understanding and agreement with our plan and will call with any changes or problems.  She tells me her gynecologist cannot locate the Mirena to remove it. She is also  wondering if she should have both her ovaries removed. She would like to get a second opinion regarding these things and we have placed a referral today to try to facilitate that.   Christina Blackwell    06/30/2012

## 2012-06-30 NOTE — Telephone Encounter (Signed)
gve the pt her aug 2013 appt calendar 

## 2012-07-01 ENCOUNTER — Ambulatory Visit: Payer: 59 | Admitting: Physical Therapy

## 2012-07-01 ENCOUNTER — Ambulatory Visit
Admission: RE | Admit: 2012-07-01 | Discharge: 2012-07-01 | Disposition: A | Discharge status: Home / Self Care | Payer: 59 | Source: Ambulatory Visit | Attending: Radiation Oncology | Admitting: Radiation Oncology

## 2012-07-02 ENCOUNTER — Ambulatory Visit
Admission: RE | Admit: 2012-07-02 | Discharge: 2012-07-02 | Disposition: A | Discharge status: Home / Self Care | Payer: 59 | Source: Ambulatory Visit | Attending: Radiation Oncology | Admitting: Radiation Oncology

## 2012-07-02 ENCOUNTER — Encounter: Payer: Self-pay | Admitting: Radiation Oncology

## 2012-07-02 VITALS — BP 120/80 | HR 94 | Temp 98.2°F | Resp 20 | Wt 162.5 lb

## 2012-07-02 DIAGNOSIS — C50919 Malignant neoplasm of unspecified site of unspecified female breast: Secondary | ICD-10-CM

## 2012-07-02 NOTE — Progress Notes (Signed)
Patient  Alert oriented x3, right breast 8/33 rad txs completed, breast slight erythema, patient wearing ace bandage for lymphedema right arm and hand, no chemotherapy completed June 13,2013, states patient, fatigue low ,but working, , no pain in breast 5:21 PM

## 2012-07-05 ENCOUNTER — Encounter: Payer: Self-pay | Admitting: Radiation Oncology

## 2012-07-05 ENCOUNTER — Ambulatory Visit
Admission: RE | Admit: 2012-07-05 | Discharge: 2012-07-05 | Disposition: A | Discharge status: Home / Self Care | Payer: 59 | Source: Ambulatory Visit | Attending: Radiation Oncology | Admitting: Radiation Oncology

## 2012-07-05 ENCOUNTER — Ambulatory Visit: Payer: 59

## 2012-07-05 NOTE — Progress Notes (Signed)
  Radiation Oncology         [phone] ________________________________  Name: Christina Blackwell MRN: [id number]  Date: 07/02/2012  DOB: 12-28-1965  Weekly Radiation Therapy Management  Current Dose: 14.4 Gy     Planned Dose:  50.4 Gy  Narrative . . . . . . . . The patient presents for routine under treatment assessment.                                                      The patient is without complaint.                                 Set-up films were reviewed. She's had some minor shifts requiring extra port films during the course of this week.                                 The chart was checked. Physical Findings. . . Weight essentially stable.  No significant changes. She has mild erythema of the treated breast with no desquamation Impression . . . . . . . The patient is  tolerating radiation. Plan . . . . . . . . . . . . Continue treatment as planned.  ________________________________  Artist Pais. Christina Blackwell, M.D.

## 2012-07-06 ENCOUNTER — Ambulatory Visit
Admission: RE | Admit: 2012-07-06 | Discharge: 2012-07-06 | Disposition: A | Discharge status: Home / Self Care | Payer: 59 | Source: Ambulatory Visit | Attending: Radiation Oncology | Admitting: Radiation Oncology

## 2012-07-07 ENCOUNTER — Ambulatory Visit: Payer: 59

## 2012-07-07 ENCOUNTER — Ambulatory Visit
Admission: RE | Admit: 2012-07-07 | Discharge: 2012-07-07 | Disposition: A | Discharge status: Home / Self Care | Payer: 59 | Source: Ambulatory Visit | Attending: Radiation Oncology | Admitting: Radiation Oncology

## 2012-07-08 ENCOUNTER — Ambulatory Visit
Admission: RE | Admit: 2012-07-08 | Discharge: 2012-07-08 | Disposition: A | Discharge status: Home / Self Care | Payer: 59 | Source: Ambulatory Visit | Attending: Radiation Oncology | Admitting: Radiation Oncology

## 2012-07-08 ENCOUNTER — Ambulatory Visit: Payer: 59 | Admitting: Physical Therapy

## 2012-07-09 ENCOUNTER — Ambulatory Visit
Admission: RE | Admit: 2012-07-09 | Discharge: 2012-07-09 | Disposition: A | Discharge status: Home / Self Care | Payer: 59 | Source: Ambulatory Visit | Attending: Radiation Oncology | Admitting: Radiation Oncology

## 2012-07-09 ENCOUNTER — Encounter: Payer: Self-pay | Admitting: Radiation Oncology

## 2012-07-09 VITALS — BP 123/72 | HR 83 | Resp 18 | Wt 165.0 lb

## 2012-07-09 DIAGNOSIS — C50919 Malignant neoplasm of unspecified site of unspecified female breast: Secondary | ICD-10-CM

## 2012-07-09 NOTE — Progress Notes (Signed)
Patient presents to the clinic today unaccompanied for an under treat visit with Dr. Kathrynn Running. Patient is alert and oriented to person, place, and time. No distress noted. Steady gait noted. Pleasant affect noted. Patient denies pain at this time. Right arm wrapped in ACE bandage to aid in the relief of lymphedema. Patient reports going to the lymphedema clinic 3 times per week for 6-7 weeks now. Patient reports that on Monday she saw for the first time faint signs of improvement with her lymphedema. Hyperpigmentation of the right/treated breast noted without desquamation. Patient reports using Radiaplex daily as directed. Patient denies nausea, vomiting, headache, dizziness or diarrhea. Reported all findings to Dr. Kathrynn Running.

## 2012-07-09 NOTE — Progress Notes (Signed)
  Radiation Oncology         [phone] ________________________________  Name: Christina Blackwell MRN: [id number]  Date: 07/09/2012  DOB: 1966/08/22  Weekly Radiation Therapy Management  Current Dose: 21.6 Gy     Planned Dose:  60.4 Gy  Narrative . . . . . . . . The patient presents for routine under treatment assessment.                                                      The patient is without complaint.                                 Set-up films were reviewed.                                 The chart was checked. Physical Findings. . . Weight essentially stable.  She has mild erythema, but no desquamation. Impression . . . . . . . The patient is  tolerating radiation. Plan . . . . . . . . . . . . Continue treatment as planned.  ________________________________  Artist Pais. Kathrynn Running, M.D.

## 2012-07-12 ENCOUNTER — Ambulatory Visit
Admission: RE | Admit: 2012-07-12 | Discharge: 2012-07-12 | Disposition: A | Discharge status: Home / Self Care | Payer: 59 | Source: Ambulatory Visit | Attending: Radiation Oncology | Admitting: Radiation Oncology

## 2012-07-12 ENCOUNTER — Ambulatory Visit: Payer: 59

## 2012-07-13 ENCOUNTER — Ambulatory Visit
Admission: RE | Admit: 2012-07-13 | Discharge: 2012-07-13 | Disposition: A | Discharge status: Home / Self Care | Payer: 59 | Source: Ambulatory Visit | Attending: Radiation Oncology | Admitting: Radiation Oncology

## 2012-07-13 DIAGNOSIS — C50919 Malignant neoplasm of unspecified site of unspecified female breast: Secondary | ICD-10-CM

## 2012-07-14 ENCOUNTER — Ambulatory Visit: Payer: 59 | Admitting: Physical Therapy

## 2012-07-14 ENCOUNTER — Ambulatory Visit
Admission: RE | Admit: 2012-07-14 | Discharge: 2012-07-14 | Disposition: A | Discharge status: Home / Self Care | Payer: 59 | Source: Ambulatory Visit | Attending: Radiation Oncology | Admitting: Radiation Oncology

## 2012-07-14 MED ORDER — BIAFINE EX EMUL
Freq: Every day | CUTANEOUS | Status: DC
  Administered 2012-07-14: 13:00:00 via TOPICAL

## 2012-07-14 NOTE — Progress Notes (Signed)
Late entry from 07/13/2012 at 1754. Patient presented to the clinic tonight following treatment. Patient reports that her skin is dry, itchy, and irritated. Faint hyperpigmentation without desquamation of right/treated breast noted. Provided patient with Biafine emulsion and directed upon use. Patient verbalized understanding.

## 2012-07-15 ENCOUNTER — Ambulatory Visit
Admission: RE | Admit: 2012-07-15 | Discharge: 2012-07-15 | Disposition: A | Discharge status: Home / Self Care | Payer: 59 | Source: Ambulatory Visit | Attending: Radiation Oncology | Admitting: Radiation Oncology

## 2012-07-16 ENCOUNTER — Ambulatory Visit
Admission: RE | Admit: 2012-07-16 | Discharge: 2012-07-16 | Disposition: A | Discharge status: Home / Self Care | Payer: 59 | Source: Ambulatory Visit | Admitting: Radiation Oncology

## 2012-07-16 ENCOUNTER — Ambulatory Visit
Admission: RE | Admit: 2012-07-16 | Discharge: 2012-07-16 | Disposition: A | Discharge status: Home / Self Care | Payer: 59 | Source: Ambulatory Visit | Attending: Radiation Oncology | Admitting: Radiation Oncology

## 2012-07-16 ENCOUNTER — Ambulatory Visit: Payer: 59 | Attending: Physician Assistant | Admitting: Physical Therapy

## 2012-07-16 VITALS — Wt 163.4 lb

## 2012-07-16 DIAGNOSIS — I890 Lymphedema, not elsewhere classified: Secondary | ICD-10-CM | POA: Insufficient documentation

## 2012-07-16 DIAGNOSIS — Z853 Personal history of malignant neoplasm of breast: Secondary | ICD-10-CM

## 2012-07-16 DIAGNOSIS — M24519 Contracture, unspecified shoulder: Secondary | ICD-10-CM | POA: Insufficient documentation

## 2012-07-16 DIAGNOSIS — M25619 Stiffness of unspecified shoulder, not elsewhere classified: Secondary | ICD-10-CM | POA: Insufficient documentation

## 2012-07-16 DIAGNOSIS — M25519 Pain in unspecified shoulder: Secondary | ICD-10-CM | POA: Insufficient documentation

## 2012-07-16 DIAGNOSIS — IMO0001 Reserved for inherently not codable concepts without codable children: Secondary | ICD-10-CM | POA: Insufficient documentation

## 2012-07-16 DIAGNOSIS — C50919 Malignant neoplasm of unspecified site of unspecified female breast: Secondary | ICD-10-CM

## 2012-07-16 NOTE — Progress Notes (Signed)
HERE TODAY FOR PUT OF RIGHT BREAST.  NOTED RADIATION DERMATITIS, USING BIAFINE AND SAYS IT IS WORKING TO HELP THE ITCHING.  WENT TODAY TO BE FITTED FOR LYMPHADEMA SLEEVE.

## 2012-07-18 ENCOUNTER — Encounter: Payer: Self-pay | Admitting: Radiation Oncology

## 2012-07-18 NOTE — Progress Notes (Signed)
  Radiation Oncology         [phone] ________________________________  Name: Christina Blackwell MRN: [id number]  Date: 07/16/2012  DOB: [birth year]  Weekly Radiation Therapy Management  Current Dose: 32.4 Gy     Planned Dose:  60.4 Gy  Narrative . . . . . . . . The patient presents for routine under treatment assessment.                                               The patient notes some urination in the treated breast. She began using Biafine topically and this has helped.                                 Set-up films were reviewed.                                 The chart was checked. Physical Findings. . . Weight essentially stable.  No significant changes. The patient's breast is notable for erythema with no desquamation. Impression . . . . . . . The patient is  tolerating radiation. Plan . . . . . . . . . . . . Continue treatment as planned.  ________________________________  Artist Pais. Kathrynn Running, M.D.

## 2012-07-19 ENCOUNTER — Ambulatory Visit: Payer: 59

## 2012-07-19 ENCOUNTER — Ambulatory Visit
Admission: RE | Admit: 2012-07-19 | Discharge: 2012-07-19 | Disposition: A | Discharge status: Home / Self Care | Payer: 59 | Source: Ambulatory Visit | Attending: Radiation Oncology | Admitting: Radiation Oncology

## 2012-07-20 ENCOUNTER — Ambulatory Visit
Admission: RE | Admit: 2012-07-20 | Discharge: 2012-07-20 | Disposition: A | Discharge status: Home / Self Care | Payer: 59 | Source: Ambulatory Visit | Attending: Radiation Oncology | Admitting: Radiation Oncology

## 2012-07-21 ENCOUNTER — Ambulatory Visit
Admission: RE | Admit: 2012-07-21 | Discharge: 2012-07-21 | Disposition: A | Discharge status: Home / Self Care | Payer: 59 | Source: Ambulatory Visit | Attending: Radiation Oncology | Admitting: Radiation Oncology

## 2012-07-21 ENCOUNTER — Ambulatory Visit: Payer: 59

## 2012-07-21 ENCOUNTER — Ambulatory Visit (HOSPITAL_BASED_OUTPATIENT_CLINIC_OR_DEPARTMENT_OTHER): Payer: 59 | Admitting: Physician Assistant

## 2012-07-21 ENCOUNTER — Encounter: Payer: Self-pay | Admitting: Physician Assistant

## 2012-07-21 VITALS — BP 103/71 | HR 89 | Temp 98.3°F | Resp 20 | Ht 64.0 in | Wt 160.3 lb

## 2012-07-21 DIAGNOSIS — Z170 Estrogen receptor positive status [ER+]: Secondary | ICD-10-CM

## 2012-07-21 DIAGNOSIS — C773 Secondary and unspecified malignant neoplasm of axilla and upper limb lymph nodes: Secondary | ICD-10-CM

## 2012-07-21 DIAGNOSIS — C50919 Malignant neoplasm of unspecified site of unspecified female breast: Secondary | ICD-10-CM

## 2012-07-21 DIAGNOSIS — F329 Major depressive disorder, single episode, unspecified: Secondary | ICD-10-CM

## 2012-07-21 DIAGNOSIS — Z853 Personal history of malignant neoplasm of breast: Secondary | ICD-10-CM

## 2012-07-21 DIAGNOSIS — F3289 Other specified depressive episodes: Secondary | ICD-10-CM

## 2012-07-21 DIAGNOSIS — F32A Depression, unspecified: Secondary | ICD-10-CM

## 2012-07-21 MED ORDER — CITALOPRAM HYDROBROMIDE 10 MG PO TABS: 10.0000 mg | ORAL_TABLET | Freq: Every day | ORAL | Status: DC

## 2012-07-21 NOTE — Progress Notes (Signed)
ID: SUSANNAH CARBIN   DOB: 1966/08/12  MR#: [id number]  CSN#:[id number]  HISTORY OF PRESENT ILLNESS: Christina Blackwell is a 46 year old [place] woman formerly followed here for a left-sided stage I invasive ductal carcinoma, which was treated with adjuvant chemotherapy completed in 2003. She was released from followup last year.  On 12/28/2011 she had bilateral screening mammography showing a possible area of distortion in the right breast. She was recalled for additional views 01/09/2012 and this confirmed an area of architectural distortion in the lateral aspect of the right breast measuring 2.9 cm. This was not palpable by exam. Biopsy was performed the same day and showed ([id number]) and invasive lobular breast cancer, e-cadherin negative, strongly estrogen receptor positive at 85%, progesterone receptor positive at 98%, with an MIB-1 of 9%, and no HER-2 amplification.  Patient is status post right lumpectomy and x-ray lymph node dissection 03/02/2012 for a T2 N2 invasive lobular breast carcinoma, grade 1. 10 of 10 lymph nodes were positive. Margins were close but negative. Her subsequent history is as detailed below  INTERVAL HISTORY:  Christina Blackwell returns today for followup of her right breast carcinoma, and also to discuss her anxiety and depression. She continues to receive radiation therapy which she is tolerating well with the exception of significant fatigue. She continues to be seen at the lymphedema clinic for right upper extremity lymphedema which is slowly improving. She now has a sleeve and glove, both of which help.  Nerine's biggest complaint continues to be her anxiety and depression. She feels like it is "taking forever" to get through this treatment. This makes her feel frustrated, and anxious. She denies suicidal ideations. She tells me she is "too chicken to do anything like that".   REVIEW OF SYSTEMS: Otherwise, Christina Blackwell has had no recent illnesses and denies fevers or chills. She continues to  have frequent hot flashes, and has had no menstrual cycles. No signs of abnormal bleeding. No nausea or change in bowel habits. No cough, shortness of breath, or chest pain. No abnormal headaches or dizziness. No unusual myalgias, arthralgias, peripheral swelling, or peripheral neuropathy.    A detailed review of systems is otherwise noncontributory.   PAST MEDICAL HISTORY: Past Medical History  Diagnosis Date  . History of bilateral breast implants   . GERD (gastroesophageal reflux disease)   . Complication of anesthesia   . PONV (postoperative nausea and vomiting)   . History of radiation therapy 03/30/02-05/16/02    left breast   . History of chemotherapy     cyclosphosphamide/ doxorubicin  . Breast cancer 2002    left   . Breast cancer 2012    right  . Anxiety   . Depression   . History of chemotherapy 4th:June 13,2013    taxol    PAST SURGICAL HISTORY: Past Surgical History  Procedure Date  . Portacath placement   . Portacath removal   . Tonsillectomy and adenoidectomy   . Breast lumpectomy 11/2001    left  . Sentinel lymph node biopsy 11/2001    left axillary  . Breast enhancement surgery 2003    bilateral  . Breast lumpectomy w/ needle localization 02/05/2012    right/ slnbx  . Mirena iud     still in place as of 06/09/12  . Axillary lymph node dissection 03/02/2012    Procedure: AXILLARY LYMPH NODE DISSECTION;  Surgeon: Kandis Cocking, MD;  Location: [facility];  Service: General;  Laterality: Right;    FAMILY HISTORY Family History  Problem Relation  Age of Onset  . Cancer Mother     breast  . Cancer Paternal Aunt     breast  . Cancer Cousin     breast ca 58's 1st cousin    Gynecologic history:  Menarche age 48, she is GX P0. She is currently using a Mirena implant  and is considering bilateral salpingo-oophorectomy.  Social history:  She works for [employer], but her job was recently eliminated, and she is temporarily employed  by them at present. She is divorced and lives by herself, with 2 birds, an [place] parrot and a cockateal. 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   ADVANCED DIRECTIVES: in place   HEALTH MAINTENANCE: History  Substance Use Topics  . Smoking status: Never Smoker   . Smokeless tobacco: Not on file  . Alcohol Use: Yes     social occasio0nally 1-2      Colonoscopy:  PAP:  Bone density:  Lipid panel:  No Known Allergies  Current Outpatient Prescriptions  Medication Sig Dispense Refill  . cetirizine (ZYRTEC) 10 MG tablet Take 10 mg by mouth as needed.      . citalopram (CELEXA) 10 MG tablet Take 1 tablet (10 mg total) by mouth daily.  30 tablet  2  . clonazePAM (KLONOPIN) 0.5 MG tablet Take 0.5 mg by mouth 2 (two) times daily as needed. Per patient stated 02/23/12      . diphenhydrAMINE (SOMINEX) 25 MG tablet Take 25 mg by mouth 4 (four) times daily as needed.      Marland Kitchen ibuprofen (ADVIL,MOTRIN) 200 MG tablet Take 200 mg by mouth every 6 (six) hours as needed.      Marland Kitchen LORazepam (ATIVAN) 0.5 MG tablet Take 1 tablet (0.5 mg total) by mouth 2 (two) times daily as needed for anxiety.  45 tablet  0  . omeprazole (PRILOSEC) 20 MG capsule Take 20 mg by mouth daily.      . ondansetron (ZOFRAN) 8 MG tablet       . prochlorperazine (COMPAZINE) 10 MG tablet Take 1 tablet (10 mg total) by mouth every 6 (six) hours as needed.  30 tablet  3  . temazepam (RESTORIL) 15 MG capsule Take 1 capsule (15 mg total) by mouth at bedtime as needed for sleep.  30 capsule  1  . zolpidem (AMBIEN) 5 MG tablet Take 1 tablet (5 mg total) by mouth at bedtime as needed.  30 tablet  0   No current facility-administered medications for this visit.   Facility-Administered Medications Ordered in Other Visits  Medication Dose Route Frequency Provider Last Rate Last Dose  . topical emolient (BIAFINE) emulsion   Topical Daily Oneita Hurt, MD        OBJECTIVE: Middle-aged white woman who appears mildly anxious, but in no acute  distress. Filed Vitals:   07/21/12 1532  BP: 103/71  Pulse: 89  Temp: 98.3 F (36.8 C)  Resp: 20     Body mass index is 27.52 kg/(m^2).    ECOG FS: 1  Filed Weights   07/21/12 1532  Weight: 160 lb 4.8 oz (72.712 kg)   Physical Exam: HEENT:  Sclerae anicteric.  Oropharynx clear.   Nodes:  No cervical, supraclavicular, or axillary lymphadenopathy palpated.  Breast Exam:  Deferred Lungs:  Clear to auscultation bilaterally.  No crackles, rhonchi, or wheezes.   Heart:  Regular rate and rhythm.   Abdomen:  Soft, nontender.  Positive bowel sounds. Musculoskeletal:  No focal spinal tenderness to palpation.  Extremities:  Benign.  No peripheral edema or cyanosis.   Skin:  Benign.   Neuro:  Nonfocal. Alert and oriented x3.    LAB RESULTS: Lab Results  Component Value Date   WBC 5.6 06/30/2012   NEUTROABS 3.5 06/30/2012   HGB 13.6 06/30/2012   HCT 40.2 06/30/2012   MCV 91.0 06/30/2012   PLT 213 06/30/2012      Chemistry      Component Value Date/Time   NA 140 06/30/2012 0841   K 4.0 06/30/2012 0841   CL 108 06/30/2012 0841   CO2 25 06/30/2012 0841   BUN 11 06/30/2012 0841   CREATININE 0.75 06/30/2012 0841      Component Value Date/Time   CALCIUM 9.3 06/30/2012 0841   ALKPHOS 47 06/30/2012 0841   AST 16 06/30/2012 0841   ALT 14 06/30/2012 0841   BILITOT 0.5 06/30/2012 0841       Lab Results  Component Value Date   LABCA2 23 02/20/2011     STUDIES:  No recent studies.   ASSESSMENT: 46 y.o.  BRCA 1-2 negative [place] woman  (1) status post left lumpectomy and sentinel lymph node biopsy December 2002 for a 1.9 cm invasive ductal carcinoma, no lymph node involvement, triple negative, treated with 4 cycles of Cytoxan and Adriamycin followed by radiation, with no evidence of disease recurrence to date.  (2) status post Right lumpectomy and axillary lymph node dissection 03/02/2012 for a T2 N2 invasive lobular breast cancer, grade 1, estrogen receptor 85% and progesterone  receptor 98% positive, with HER-2 nonamplified and an MIB-1 of 9%; 10 of 10 lymph nodes involved. (3)  completed 4 cycles of docetaxel/cyclophosphamide 05/27/2012 (4)  currently receiving active radiation therapy, which will be followed by oral anti-estrogen therapy (5)  lymphedema, right upper extremity   PLAN: Andrina continues to tolerate radiation well. We spent over half of our 25 minute appointment today discussing her depression and reviewing her options for treatment. We again discussed the possibility of beginning on low-dose Celexa. Prescribe 10 mg tablets, and she will try taking half a tablet daily for the next several days, then alternate between 5 mg and 10 mg, ultimately taking 10 mg daily by August 21. I will see her one week later on August 28 to assess tolerance, but she will call prior that time with any problems.  When I see her in 3 weeks, she will have completed her radiation therapy, and we will also be ready to discuss antiestrogen therapy, specifically tamoxifen.  She tells me her gynecologist cannot locate the Mirena to remove it. She is also wondering if she should have both her ovaries removed. She would like to get a second opinion regarding these things and we have placed a referral today to try to facilitate that.   Mardel Grudzien    07/21/2012

## 2012-07-22 ENCOUNTER — Ambulatory Visit
Admission: RE | Admit: 2012-07-22 | Discharge: 2012-07-22 | Disposition: A | Discharge status: Home / Self Care | Payer: 59 | Source: Ambulatory Visit | Attending: Radiation Oncology | Admitting: Radiation Oncology

## 2012-07-22 ENCOUNTER — Encounter: Payer: Self-pay | Admitting: Radiation Oncology

## 2012-07-22 ENCOUNTER — Encounter: Payer: 59 | Admitting: Physical Therapy

## 2012-07-22 VITALS — BP 115/83 | HR 75 | Resp 18 | Wt 158.4 lb

## 2012-07-22 DIAGNOSIS — C50919 Malignant neoplasm of unspecified site of unspecified female breast: Secondary | ICD-10-CM

## 2012-07-22 NOTE — Progress Notes (Signed)
Patient presents to the clinic today unaccompanied for PUT with Dr. Mitzi Hansen following treatment. Patient is alert and oriented to person, place, and time. No distress noted. Steady gait noted. Pleasant affect noted. Patient denies pain at this time. Patient reports discomfort of right breast related to skin irritation and dermatitis. Hyperpigmentation without desquamation noted. Radiation dermatitis noted right chest wall. Patient reports using Biafine and hydrocortisone as directed but, reports this treatment field continues to itch. Patient reports fatigue. Patient states, "I didn't go to work today because I was so tired I couldn't get out of bed." Reported all findings to Dr. Mitzi Hansen.

## 2012-07-22 NOTE — Progress Notes (Signed)
   Department of Radiation Oncology  Phone:  [phone] Fax:        [phone]  Weekly Treatment Note    Name: Christina Blackwell Date: 07/22/2012 MRN: [id number] DOB: [birth year]   Current dose: 39.6 Gy  Current fraction: 22   MEDICATIONS: Current Outpatient Prescriptions  Medication Sig Dispense Refill  . cetirizine (ZYRTEC) 10 MG tablet Take 10 mg by mouth as needed.      . citalopram (CELEXA) 10 MG tablet Take 1 tablet (10 mg total) by mouth daily.  30 tablet  2  . clonazePAM (KLONOPIN) 0.5 MG tablet Take 0.5 mg by mouth 2 (two) times daily as needed. Per patient stated 02/23/12      . diphenhydrAMINE (SOMINEX) 25 MG tablet Take 25 mg by mouth 4 (four) times daily as needed.      Marland Kitchen ibuprofen (ADVIL,MOTRIN) 200 MG tablet Take 200 mg by mouth every 6 (six) hours as needed.      Marland Kitchen LORazepam (ATIVAN) 0.5 MG tablet Take 1 tablet (0.5 mg total) by mouth 2 (two) times daily as needed for anxiety.  45 tablet  0  . omeprazole (PRILOSEC) 20 MG capsule Take 20 mg by mouth daily.      . ondansetron (ZOFRAN) 8 MG tablet       . zolpidem (AMBIEN) 5 MG tablet Take 1 tablet (5 mg total) by mouth at bedtime as needed.  30 tablet  0  . prochlorperazine (COMPAZINE) 10 MG tablet Take 1 tablet (10 mg total) by mouth every 6 (six) hours as needed.  30 tablet  3  . temazepam (RESTORIL) 15 MG capsule Take 1 capsule (15 mg total) by mouth at bedtime as needed for sleep.  30 capsule  1   No current facility-administered medications for this encounter.   Facility-Administered Medications Ordered in Other Encounters  Medication Dose Route Frequency Provider Last Rate Last Dose  . topical emolient (BIAFINE) emulsion   Topical Daily Oneita Hurt, MD         ALLERGIES: Review of patient's allergies indicates no known allergies.   LABORATORY DATA:  Lab Results  Component Value Date   WBC 5.6 06/30/2012   HGB 13.6 06/30/2012   HCT 40.2 06/30/2012   MCV 91.0 06/30/2012   PLT 213 06/30/2012    Lab Results  Component Value Date   NA 140 06/30/2012   K 4.0 06/30/2012   CL 108 06/30/2012   CO2 25 06/30/2012   Lab Results  Component Value Date   ALT 14 06/30/2012   AST 16 06/30/2012   ALKPHOS 47 06/30/2012   BILITOT 0.5 06/30/2012     NARRATIVE: Christina Blackwell was seen today for weekly treatment management. The chart was checked and the patient's films were reviewed. The patient continues to have significant skin irritation. This is her primary complaint today in addition to some fatigue. She is using Biafine and hydrocortisone cream. This helps some but not completely.  PHYSICAL EXAMINATION: weight is 158 lb 6.4 oz (71.85 kg). Her blood pressure is 115/83 and her pulse is 75. Her respiration is 18.      diffuse erythema with radiation dermatitis. No desquamation in the inframammary region or supraclavicular region.  ASSESSMENT: The patient is doing satisfactorily with treatment.  PLAN: We will continue with the patient's radiation treatment as planned. She appears to be doing relatively well at this point. We will continue her current skin care.

## 2012-07-23 ENCOUNTER — Ambulatory Visit: Payer: 59 | Admitting: Physical Therapy

## 2012-07-23 ENCOUNTER — Ambulatory Visit
Admission: RE | Admit: 2012-07-23 | Discharge: 2012-07-23 | Disposition: A | Discharge status: Home / Self Care | Payer: 59 | Source: Ambulatory Visit | Attending: Radiation Oncology | Admitting: Radiation Oncology

## 2012-07-26 ENCOUNTER — Encounter: Payer: Self-pay | Admitting: Radiation Oncology

## 2012-07-26 ENCOUNTER — Ambulatory Visit
Admission: RE | Admit: 2012-07-26 | Discharge: 2012-07-26 | Disposition: A | Discharge status: Home / Self Care | Payer: 59 | Source: Ambulatory Visit | Attending: Radiation Oncology | Admitting: Radiation Oncology

## 2012-07-26 ENCOUNTER — Encounter: Payer: Self-pay | Admitting: Physician Assistant

## 2012-07-26 DIAGNOSIS — C50919 Malignant neoplasm of unspecified site of unspecified female breast: Secondary | ICD-10-CM

## 2012-07-26 NOTE — Progress Notes (Signed)
I spoke with the patient by phone today. She was started on low-dose Celexa last week. She tried taking a low dose of 5 mg daily for several days, but tells me "it made me for worse" and she discontinued the medication. She continues to have some depression and anxiety, but today is "a good day". She is hopeful that when she finishes radiation therapy and against to get some normalcy back in her life, she will begin to feel better. She does agree to speak with one of our counselors, and would like a referral to our chaplain,Terry Moore-Painter.  Zollie Scale, PA-C 07/26/2012

## 2012-07-26 NOTE — Progress Notes (Signed)
  Radiation Oncology         [phone] ________________________________  Name: MAKYIAH LIE MRN: [id number]  Date: 07/26/2012  DOB: February 18, 1966  Chart Note:  I reviewed this patient's most recent findings and wanted to take a minute to document my impression.  She is currently receiving radiation to the breast and asked to be seen because of increasing itchiness. She does have erythema and a follicular reaction in the upper quadrant of the breast. She is currently using black female for this as well as topical hydrocortisone. On inspection, she appears of erythema extending outside of the radiation fields superiorly and medially with some erythema of the dermis and a plaque-like appearance suggestive of possible yeast involvement.  I gave her miconazole powder to be applied topically twice daily and will follow up with her during her under treatment visit later this week to assess response. ________________________________  Artist Pais. Kathrynn Running, M.D.

## 2012-07-26 NOTE — Progress Notes (Signed)
Ms. Cwynar presents today with overt redness, rash-like appearance and itching in the radiation therapy treatment field on the medial to upper inner portion of right breast extending to the neck with redness noted in the mid chest to neck area as well.  Using Biafine and Hydrocortisone in the area without relief. s. Mayford Knife seen by Dr. Kathrynn Running and given Miconazole powder to use in this area for questionable yeast.  Instruction not to use any lotion in this affected area for at least the next 2 days so that it can be determined if area responding to Miconazole powder.  She stated understanding.

## 2012-07-26 NOTE — Progress Notes (Signed)
  Radiation Oncology         [phone] ________________________________  Name: Christina Blackwell MRN: [id number]  Date: 07/26/2012  DOB: 06-02-1966  COMPLEX SIMULATION NOTE  DIAGNOSIS:  46 year old woman with stage T2 N2 (10/10 lymph nodes) M0 grade 1 invasive lobular breast carcinoma  NARRATIVE:  The patient underwent virtual simulation today for her planned radiation boost.  All relevant records and images related to the planned course of therapy were reviewed.  Her existing CT images were loaded into the planning software.  Then the target, including the lumpectomy cavity and overlying the surgical incision, and avoidance structures were reconstructed in 3-D.  Treatment planning then occurred.  The radiation prescription was entered and confirmed.  A single complex treatment device was fabricated to shape a customized lead aperture around the target volume excluding uninvolved breast tissue for the delivery of electrons. I have requested : A special port planned to characterize the depth dose distribution of the electron beam.  I will review the electron plan once it is available but anticipate delivering 12 MeV electrons to the depth of 90% dose in order to maximally cover the target volume while sparing the underlying intrathoracic contents maximally.  PLAN:  The patient will receive 10 Gy in 5 fractions to the boost site to increase the cumulative nominal dose to 60.4 Gy in.  ________________________________  Artist Pais. Kathrynn Running, M.D.

## 2012-07-27 ENCOUNTER — Ambulatory Visit
Admission: RE | Admit: 2012-07-27 | Discharge: 2012-07-27 | Disposition: A | Discharge status: Home / Self Care | Payer: 59 | Source: Ambulatory Visit | Attending: Radiation Oncology | Admitting: Radiation Oncology

## 2012-07-28 ENCOUNTER — Ambulatory Visit: Payer: 59

## 2012-07-28 ENCOUNTER — Ambulatory Visit
Admission: RE | Admit: 2012-07-28 | Discharge: 2012-07-28 | Disposition: A | Discharge status: Home / Self Care | Payer: 59 | Source: Ambulatory Visit | Attending: Radiation Oncology | Admitting: Radiation Oncology

## 2012-07-29 ENCOUNTER — Ambulatory Visit
Admission: RE | Admit: 2012-07-29 | Discharge: 2012-07-29 | Disposition: A | Discharge status: Home / Self Care | Payer: 59 | Source: Ambulatory Visit | Attending: Radiation Oncology | Admitting: Radiation Oncology

## 2012-07-29 ENCOUNTER — Ambulatory Visit: Payer: 59 | Admitting: Physical Therapy

## 2012-07-29 ENCOUNTER — Encounter: Payer: Self-pay | Admitting: Specialist

## 2012-07-29 NOTE — Progress Notes (Signed)
Called to dressing room tonight by therapist because patient concerned about her skin. Radiation dermatitis notes across right chest wall. Patient reports this area is dry and itchy. Patient most concern about dime size area of dry desquamation under right axilla. Hyperpigmentation of entire right breast noted. Encouraged patient to apply hydrocortisone to area of dermatitis, biafine to entire breast, and neosporin to dime size area of desquamation. Reminded patient to not have any of these products or others on her treatment field four hours prior to treatment. Encouraged patient to contact staff with needs. Patient verbalized understanding.

## 2012-07-29 NOTE — Progress Notes (Signed)
At the request of Zollie Scale, physician assistant, I have tried twice to contact Ms. Henery.  Amy indicated that Ms. Biggs had expressed an interest in talking to the chaplain.  I have left messages but Ms. Magno has not returned my call.

## 2012-07-30 ENCOUNTER — Ambulatory Visit
Admission: RE | Admit: 2012-07-30 | Discharge: 2012-07-30 | Disposition: A | Discharge status: Home / Self Care | Payer: 59 | Source: Ambulatory Visit | Attending: Radiation Oncology | Admitting: Radiation Oncology

## 2012-07-30 ENCOUNTER — Encounter: Payer: Self-pay | Admitting: Radiation Oncology

## 2012-07-30 DIAGNOSIS — C50919 Malignant neoplasm of unspecified site of unspecified female breast: Secondary | ICD-10-CM

## 2012-07-30 NOTE — Progress Notes (Signed)
  Radiation Oncology         [phone]) [phone] ________________________________  Name: Christina Blackwell MRN: [id number]  Date: 07/30/2012  DOB: 01/22/1966  Weekly Radiation Therapy Management  Current Dose: 50.4 Gy     Planned Dose:  60.4 Gy  Narrative . . . . . . . . The patient presents for routine under treatment assessment.                                       The patient is experiencing mild skin discomfort..                                 Set-up films were reviewed. Also, her electron field was clinically set up and verified by me and the linear accelerator.                                 The chart was checked. Physical Findings. . . She is in no acute distress today. She has erythema hyperpigmentation and some follicular changes of the treated breast which are largely unchanged from previous week. No significant changes. Impression . . . . . . . The patient is  tolerating radiation. Plan . . . . . . . . . . . . Continue treatment as planned.  ________________________________  Artist Pais. Kathrynn Running, M.D.

## 2012-08-02 ENCOUNTER — Encounter: Payer: Self-pay | Admitting: Specialist

## 2012-08-02 ENCOUNTER — Ambulatory Visit
Admission: RE | Admit: 2012-08-02 | Discharge: 2012-08-02 | Disposition: A | Discharge status: Home / Self Care | Payer: 59 | Source: Ambulatory Visit | Attending: Radiation Oncology | Admitting: Radiation Oncology

## 2012-08-02 NOTE — Progress Notes (Signed)
I talked to Christina Blackwell at the request of Zollie Scale, physician assistant.  She expressed having difficulty transitioning to life after treatment for a second time.  I agreed to meet with her and make some suggestions regarding available services that might help her with that transition.  I will see her on August 03, 2012.

## 2012-08-03 ENCOUNTER — Encounter: Payer: Self-pay | Admitting: Specialist

## 2012-08-03 ENCOUNTER — Ambulatory Visit
Admission: RE | Admit: 2012-08-03 | Discharge: 2012-08-03 | Disposition: A | Discharge status: Home / Self Care | Payer: 59 | Source: Ambulatory Visit | Attending: Radiation Oncology | Admitting: Radiation Oncology

## 2012-08-03 NOTE — Progress Notes (Signed)
I met with Christina Blackwell this afternoon.  She described feeling as if her life will never return to normal again, after having had two unrelated breast cancer diagnoses.  She was teary as she talked.  Christina Blackwell lives alone and appears to have had little support from family or friends through this last treatment process.  She also expressed anxiety about completing treatment and not coming to the Cancer Center every day for support provided by staff here.  I encouraged her to consider counseling, FYNN, and possibly an exercise program either here or at the [facility].  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  I also encouraged her to try some of the art programs offered through [employer].  Christina Blackwell also struggles to put her two experiences into some faith context, which might help her address her ongoing question about why she has had two separate cancer diagnoses.

## 2012-08-04 ENCOUNTER — Ambulatory Visit
Admission: RE | Admit: 2012-08-04 | Discharge: 2012-08-04 | Disposition: A | Discharge status: Home / Self Care | Payer: 59 | Source: Ambulatory Visit | Attending: Radiation Oncology | Admitting: Radiation Oncology

## 2012-08-05 ENCOUNTER — Ambulatory Visit
Admission: RE | Admit: 2012-08-05 | Discharge: 2012-08-05 | Disposition: A | Discharge status: Home / Self Care | Payer: 59 | Source: Ambulatory Visit | Attending: Radiation Oncology | Admitting: Radiation Oncology

## 2012-08-06 ENCOUNTER — Ambulatory Visit
Admission: RE | Admit: 2012-08-06 | Discharge: 2012-08-06 | Disposition: A | Discharge status: Home / Self Care | Payer: 59 | Source: Ambulatory Visit | Attending: Radiation Oncology | Admitting: Radiation Oncology

## 2012-08-06 ENCOUNTER — Encounter: Payer: Self-pay | Admitting: Radiation Oncology

## 2012-08-06 VITALS — BP 110/68 | HR 80 | Temp 98.2°F | Wt 163.7 lb

## 2012-08-06 DIAGNOSIS — C50919 Malignant neoplasm of unspecified site of unspecified female breast: Secondary | ICD-10-CM

## 2012-08-06 NOTE — Progress Notes (Signed)
Patient completes radiation of her right breast today.Significant amount of hyperpigmentation and dry desquamation.Patient to continue application of biafine up to 3 times daily.Generalized fatigue. Patient has interest in [place] program and has already sent email to inquire schedule.Ha s pain at level of 5 or 6 but hasn't tried any pain medications as of today.One month follow up scheduled.

## 2012-08-08 ENCOUNTER — Encounter: Payer: Self-pay | Admitting: Radiation Oncology

## 2012-08-08 NOTE — Progress Notes (Signed)
  Radiation Oncology         [phone] ________________________________  Name: Christina Blackwell MRN: [id number]  Date: 08/06/2012  DOB: 03-26-1966  Weekly Radiation Therapy Management  Current Dose: 60.4 Gy     Planned Dose:  60.4 Gy  Narrative . . . . . . . . The patient presents for the final under treatment assessment.                                 The patient has had some continuation of previously noted symptoms.                                 Set-up films were reviewed.                                 The chart was checked. Physical Findings. . . Weight essentially stable.  No significant changes to erythema and hyperpigmentation of the treated breast. Impression . . . . . . . The patient tolerated radiation relatively well. Plan . . . . . . . . . . . . Complete radiation today as scheduled, and follow-up in one month. The patient was encouraged to call or return to the clinic in the interim for any worsening symptoms.  ________________________________  Artist Pais Kathrynn Running, M.D.

## 2012-08-10 ENCOUNTER — Encounter: Payer: Self-pay | Admitting: Specialist

## 2012-08-10 NOTE — Progress Notes (Signed)
As a follow-up to my meeting with Christina Blackwell on August 20, I called her today and left on her voice mail information about a possible counselor for her to see.  The counselor, who now works at the [employer] at [place], did an internship here over the summer and is familiar with the issues and concerns faced by individuals dealing with cancer.  Seeing someone familiar with those issues was important to [place].

## 2012-08-11 ENCOUNTER — Other Ambulatory Visit (HOSPITAL_BASED_OUTPATIENT_CLINIC_OR_DEPARTMENT_OTHER): Payer: 59 | Admitting: Lab

## 2012-08-11 ENCOUNTER — Ambulatory Visit (HOSPITAL_BASED_OUTPATIENT_CLINIC_OR_DEPARTMENT_OTHER): Payer: 59 | Admitting: Physician Assistant

## 2012-08-11 ENCOUNTER — Encounter: Payer: Self-pay | Admitting: Physician Assistant

## 2012-08-11 ENCOUNTER — Telehealth: Payer: Self-pay | Admitting: *Deleted

## 2012-08-11 VITALS — BP 113/79 | HR 76 | Temp 98.9°F | Resp 20 | Ht 64.0 in | Wt 164.4 lb

## 2012-08-11 DIAGNOSIS — C773 Secondary and unspecified malignant neoplasm of axilla and upper limb lymph nodes: Secondary | ICD-10-CM

## 2012-08-11 DIAGNOSIS — C50919 Malignant neoplasm of unspecified site of unspecified female breast: Secondary | ICD-10-CM

## 2012-08-11 DIAGNOSIS — I890 Lymphedema, not elsewhere classified: Secondary | ICD-10-CM

## 2012-08-11 DIAGNOSIS — Z853 Personal history of malignant neoplasm of breast: Secondary | ICD-10-CM

## 2012-08-11 LAB — COMPREHENSIVE METABOLIC PANEL (CC13)
ALT: 43 U/L (ref 0–55)
Albumin: 3.6 g/dL (ref 3.5–5.0)
CO2: 23 mEq/L (ref 22–29)
Calcium: 9.1 mg/dL (ref 8.4–10.4)
Chloride: 109 mEq/L — ABNORMAL HIGH (ref 98–107)
Creatinine: 0.8 mg/dL (ref 0.6–1.1)
Potassium: 3.8 mEq/L (ref 3.5–5.1)
Total Protein: 6.8 g/dL (ref 6.4–8.3)

## 2012-08-11 LAB — CANCER ANTIGEN 27.29: CA 27.29: 15 U/mL (ref 0–39)

## 2012-08-11 LAB — CBC WITH DIFFERENTIAL/PLATELET
BASO%: 0.2 % (ref 0.0–2.0)
EOS%: 10.4 % — ABNORMAL HIGH (ref 0.0–7.0)
MCH: 30.4 pg (ref 25.1–34.0)
MCHC: 34 g/dL (ref 31.5–36.0)
RBC: 4.54 10*6/uL (ref 3.70–5.45)
RDW: 14 % (ref 11.2–14.5)
lymph#: 1 10*3/uL (ref 0.9–3.3)

## 2012-08-11 MED ORDER — TAMOXIFEN CITRATE 20 MG PO TABS: 20.0000 mg | ORAL_TABLET | Freq: Every day | ORAL | Status: AC

## 2012-08-11 NOTE — Progress Notes (Signed)
  Radiation Oncology         [phone]) [phone] ________________________________  Name: Christina Blackwell MRN: [id number]  Date: 08/06/2012  DOB: Nov 28, 1966  End of Treatment Note  Diagnosis:  46 year old woman with stage T2 N2 (10/10 lymph nodes) M0 grade 1 invasive lobular breast carcinoma  Indication for treatment:  Curative, local control, breast conservation       Radiation treatment dates:  06/22/2012-08/06/2012  Site/dose:    1. The patient's whole breast, supraclavicular region and axilla were comprehensively irradiated to 50.4 gray in 28 fractions of 1.8 gray 2. The patient's lumpectomy site was boosted with electrons to 60.4 gray with 5 additional fractions of 1.8 gray  Beams/energy:    1 a. The patient's left supraclavicular region was treated using a right anterior oblique 6 megavolt photon fields prescribed 3 cm depth using standard techniques 1 b.  The axilla was supplemented using a left posterior oblique 6 megavolt photon fields prescribed to the mid axillary plane based on the position of the axillary blood vessels 1C. The whole left breast was treated using tangential 6 megavolt photon fields using 4 planned intensity modulation to limit with reduced fields inhomogeneous within portions of the breast with less attenuation and maximize homogeneity the radiation distribution. The patient was setup using the adjustable armboard and complex Accu form head rest port films were performed weekly to verify the position of the patient's single isocenter around which the regional lymph nodes were exactly junction to the edge of the breast tangent fields. 2. The patient's lumpectomy site was boosted using electrons.  A 12 MeV electron beam was custom-shaped to cover the patient's lumpectomy cavity as well as her lumpectomy incision conformally to maximally excluding uninvolved portions of the breast. The depth of radiation was intentionally designed to cover the entire cavity maximally while  limiting radiation exposure to the underlying intrathoracic contents using an en face beam position  Narrative: The patient tolerated radiation treatment relatively well.   She is very is radiation dermatitis characterize erythema and hyperpigmentation. She also suffered with some dry desquamation. 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 She did note some mild radiation-induced fatigue.  Plan: The patient has completed radiation treatment. The patient will return to radiation oncology clinic for routine followup in one month. I advised them to call or return sooner if they have any questions or concerns related to their recovery or treatment. ________________________________  Artist Pais. Kathrynn Running, M.D.

## 2012-08-11 NOTE — Telephone Encounter (Signed)
GAVE PATIENT APPOINTMENT FOR 09-22-2012 AT 2:45PM

## 2012-08-11 NOTE — Progress Notes (Signed)
ID: Christina Blackwell   DOB: 1966-12-09  MR#: [id number]  CSN#:[id number]  HISTORY OF PRESENT ILLNESS: [name] is a 46 year old [place] woman formerly followed here for a left-sided stage I invasive ductal carcinoma, which was treated with adjuvant chemotherapy completed in 2003. She was released from followup last year.  On 12/28/2011 she had bilateral screening mammography showing a possible area of distortion in the right breast. She was recalled for additional views 01/09/2012 and this confirmed an area of architectural distortion in the lateral aspect of the right breast measuring 2.9 cm. This was not palpable by exam. Biopsy was performed the same day and showed ([id number]) and invasive lobular breast cancer, e-cadherin negative, strongly estrogen receptor positive at 85%, progesterone receptor positive at 98%, with an MIB-1 of 9%, and no HER-2 amplification.  Patient is status post right lumpectomy and x-ray lymph node dissection 03/02/2012 for a T2 N2 invasive lobular breast carcinoma, grade 1. 10 of 10 lymph nodes were positive. Margins were close but negative. Her subsequent history is as detailed below  INTERVAL HISTORY:  Christina Blackwell returns today for followup of her right breast carcinoma.  Interval history is remarkable for [name] having completed radiation therapy earlier this week. She is now ready to discuss starting on tamoxifen.  [name] tells me she is gradually feeling "more normal" having completed her therapy. Her energy level has improved. She's exercising regularly, trying to walk during the day at work. She tried Celexa briefly, but was unable to tolerate the medication. She is seeking out counseling and has registered for the next [facility] group here at the [facility].   REVIEW OF SYSTEMS: Otherwise, [name] has had no recent illnesses and denies fevers or chills. She continues to have frequent hot flashes, and has had no menstrual cycles. No signs of abnormal bleeding. No nausea or  change in bowel habits. No cough, shortness of breath, or chest pain. No abnormal headaches or dizziness. No unusual myalgias, arthralgias, peripheral swelling, or peripheral neuropathy.   A detailed review of systems is otherwise noncontributory.   PAST MEDICAL HISTORY: Past Medical History  Diagnosis Date  . History of bilateral breast implants   . GERD (gastroesophageal reflux disease)   . Complication of anesthesia   . PONV (postoperative nausea and vomiting)   . History of radiation therapy 03/30/02-05/16/02    left breast   . History of chemotherapy     cyclosphosphamide/ doxorubicin  . Breast cancer 2002    left   . Breast cancer 2012    right  . Anxiety   . Depression   . History of chemotherapy 4th:June 13,2013    taxol    PAST SURGICAL HISTORY: Past Surgical History  Procedure Date  . Portacath placement   . Portacath removal   . Tonsillectomy and adenoidectomy   . Breast lumpectomy 11/2001    left  . Sentinel lymph node biopsy 11/2001    left axillary  . Breast enhancement surgery 2003    bilateral  . Breast lumpectomy w/ needle localization 02/05/2012    right/ slnbx  . Mirena iud     still in place as of 06/09/12  . Axillary lymph node dissection 03/02/2012    Procedure: AXILLARY LYMPH NODE DISSECTION;  Surgeon: Kandis Cocking, MD;  Location: [facility];  Service: General;  Laterality: Right;    FAMILY HISTORY Family History  Problem Relation Age of Onset  . Cancer Mother     breast  . Cancer Paternal Aunt  breast  . Cancer Cousin     breast ca 60's 1st cousin    Gynecologic history:  Menarche age 73, she is GX P0. She is currently using a Mirena implant  and is considering bilateral salpingo-oophorectomy.  Social history:  She works for [employer], but her job was recently eliminated, and she is temporarily employed by them at present. She is divorced and lives by herself, with 2 birds, an [place] parrot and a cockateal.  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   ADVANCED DIRECTIVES: in place   HEALTH MAINTENANCE: History  Substance Use Topics  . Smoking status: Never Smoker   . Smokeless tobacco: Not on file  . Alcohol Use: Yes     social occasio0nally 1-2      Colonoscopy:  PAP:  Bone density:  Lipid panel:  No Known Allergies  Current Outpatient Prescriptions  Medication Sig Dispense Refill  . cetirizine (ZYRTEC) 10 MG tablet Take 10 mg by mouth as needed.      . clonazePAM (KLONOPIN) 0.5 MG tablet Take 0.5 mg by mouth 2 (two) times daily as needed. Per patient stated 02/23/12      . ibuprofen (ADVIL,MOTRIN) 200 MG tablet Take 200 mg by mouth every 6 (six) hours as needed.      Marland Kitchen LORazepam (ATIVAN) 0.5 MG tablet Take 1 tablet (0.5 mg total) by mouth 2 (two) times daily as needed for anxiety.  45 tablet  0  . omeprazole (PRILOSEC) 20 MG capsule Take 20 mg by mouth daily.      Marland Kitchen zolpidem (AMBIEN) 5 MG tablet Take 1 tablet (5 mg total) by mouth at bedtime as needed.  30 tablet  0  . diphenhydrAMINE (SOMINEX) 25 MG tablet Take 25 mg by mouth 4 (four) times daily as needed.      . ondansetron (ZOFRAN) 8 MG tablet       . prochlorperazine (COMPAZINE) 10 MG tablet Take 1 tablet (10 mg total) by mouth every 6 (six) hours as needed.  30 tablet  3  . tamoxifen (NOLVADEX) 20 MG tablet Take 1 tablet (20 mg total) by mouth daily.  90 tablet  3  . temazepam (RESTORIL) 15 MG capsule Take 1 capsule (15 mg total) by mouth at bedtime as needed for sleep.  30 capsule  1   No current facility-administered medications for this visit.   Facility-Administered Medications Ordered in Other Visits  Medication Dose Route Frequency Provider Last Rate Last Dose  . topical emolient (BIAFINE) emulsion   Topical Daily Oneita Hurt, MD        OBJECTIVE: Middle-aged white woman who appears mildly anxious, but in no acute distress. Filed Vitals:   08/11/12 1521  BP: 113/79  Pulse: 76  Temp: 98.9 F (37.2 C)  Resp: 20      Body mass index is 28.22 kg/(m^2).    ECOG FS: 1  Filed Weights   08/11/12 1521  Weight: 164 lb 6.4 oz (74.571 kg)   Physical Exam: HEENT:  Sclerae anicteric.  Oropharynx clear.   Nodes:  No cervical or supraclavicular lymphadenopathy palpated.  Breast Exam:  Deferred Lungs:  Clear to auscultation bilaterally.  No crackles, rhonchi, or wheezes.   Heart:  Regular rate and rhythm.   Abdomen:  Soft, nontender.  Positive bowel sounds. Musculoskeletal:  No focal spinal tenderness to palpation.  Extremities: Compression sleeve intact in the right upper extremity with mild lymphedema.   No additional peripheral edema.  Neuro:  Nonfocal. Alert  and oriented x3.    LAB RESULTS: Lab Results  Component Value Date   WBC 5.2 08/11/2012   NEUTROABS 3.1 08/11/2012   HGB 13.8 08/11/2012   HCT 40.6 08/11/2012   MCV 89.4 08/11/2012   PLT 183 08/11/2012      Chemistry      Component Value Date/Time   NA 140 08/11/2012 1501   NA 140 06/30/2012 0841   K 3.8 08/11/2012 1501   K 4.0 06/30/2012 0841   CL 109* 08/11/2012 1501   CL 108 06/30/2012 0841   CO2 23 08/11/2012 1501   CO2 25 06/30/2012 0841   BUN 17.0 08/11/2012 1501   BUN 11 06/30/2012 0841   CREATININE 0.8 08/11/2012 1501   CREATININE 0.75 06/30/2012 0841      Component Value Date/Time   CALCIUM 9.1 08/11/2012 1501   CALCIUM 9.3 06/30/2012 0841   ALKPHOS 73 08/11/2012 1501   ALKPHOS 47 06/30/2012 0841   AST 29 08/11/2012 1501   AST 16 06/30/2012 0841   ALT 43 08/11/2012 1501   ALT 14 06/30/2012 0841   BILITOT 0.30 08/11/2012 1501   BILITOT 0.5 06/30/2012 0841       Lab Results  Component Value Date   LABCA2 23 02/20/2011     STUDIES:  No recent studies.   ASSESSMENT: 46 y.o.  BRCA 1-2 negative [place] woman  (1) status post left lumpectomy and sentinel lymph node biopsy December 2002 for a 1.9 cm invasive ductal carcinoma, no lymph node involvement, triple negative, treated with 4 cycles of Cytoxan and Adriamycin followed by radiation,  with no evidence of disease recurrence to date.  (2) status post Right lumpectomy and axillary lymph node dissection 03/02/2012 for a T2 N2 invasive lobular breast cancer, grade 1, estrogen receptor 85% and progesterone receptor 98% positive, with HER-2 nonamplified and an MIB-1 of 9%; 10 of 10 lymph nodes involved. (3)  completed 4 cycles of docetaxel/cyclophosphamide 05/27/2012 (4)  Status post radiation therapy,completed late August 2013, to be followed by tamoxifen (5)  lymphedema, right upper extremity   PLAN: Meline is ready to start on tamoxifen, 20 mg daily.  Over half of our 30 minute appointment today was spent reviewing the benefits but also the possible side effects associated with tamoxifen. She was given written information about the drug as well. I will plan on seeing her back in approximately 6 weeks to assess tolerance. At that time we will discuss long-term followup.  She tells me her  Mirena IUD is still in place because they "couldn't find the string" to remove it prior to chemo. She is in the process of getting an appointment for another gynecologist for removal.   [facility]    08/11/2012

## 2012-09-01 ENCOUNTER — Encounter: Payer: Self-pay | Admitting: Radiation Oncology

## 2012-09-01 DIAGNOSIS — Z923 Personal history of irradiation: Secondary | ICD-10-CM | POA: Insufficient documentation

## 2012-09-02 ENCOUNTER — Ambulatory Visit
Admission: RE | Admit: 2012-09-02 | Discharge: 2012-09-02 | Disposition: A | Discharge status: Home / Self Care | Payer: 59 | Source: Ambulatory Visit | Attending: Radiation Oncology | Admitting: Radiation Oncology

## 2012-09-02 ENCOUNTER — Encounter: Payer: Self-pay | Admitting: Radiation Oncology

## 2012-09-02 VITALS — BP 117/65 | HR 82 | Temp 98.3°F | Resp 20 | Wt 165.3 lb

## 2012-09-02 DIAGNOSIS — R110 Nausea: Secondary | ICD-10-CM

## 2012-09-02 DIAGNOSIS — Z853 Personal history of malignant neoplasm of breast: Secondary | ICD-10-CM

## 2012-09-02 DIAGNOSIS — C50919 Malignant neoplasm of unspecified site of unspecified female breast: Secondary | ICD-10-CM

## 2012-09-02 DIAGNOSIS — R5383 Other fatigue: Secondary | ICD-10-CM

## 2012-09-02 NOTE — Progress Notes (Signed)
  Radiation Oncology         [phone]) [phone] ________________________________  Name: Christina Blackwell MRN: [id number]  Date: 09/02/2012  DOB: [birth year]  Follow-Up Visit Note  CC: Provider Not In System  Kandis Cocking, MD  Diagnosis:   46 year old woman with stage T2 N2 (10/10 lymph nodes) M0 grade 1 invasive lobular breast carcinoma  Interval Since Last Radiation:  1 months  Narrative:  The patient returns today for routine follow-up.  She reports persistent fatigue and is also noticed some nausea episodes or primarily in the mornings which responds to Zofran.   ALLERGIES:   has no known allergies.  Meds: Current Outpatient Prescriptions  Medication Sig Dispense Refill  . cetirizine (ZYRTEC) 10 MG tablet Take 10 mg by mouth as needed.      . clonazePAM (KLONOPIN) 0.5 MG tablet Take 0.5 mg by mouth 2 (two) times daily as needed. Per patient stated 02/23/12      . ibuprofen (ADVIL,MOTRIN) 200 MG tablet Take 200 mg by mouth every 6 (six) hours as needed.      Marland Kitchen LORazepam (ATIVAN) 0.5 MG tablet Take 1 tablet (0.5 mg total) by mouth 2 (two) times daily as needed for anxiety.  45 tablet  0  . omeprazole (PRILOSEC) 20 MG capsule Take 20 mg by mouth daily.      . tamoxifen (NOLVADEX) 20 MG tablet Take 1 tablet (20 mg total) by mouth daily.  90 tablet  3  . zolpidem (AMBIEN) 5 MG tablet Take 1 tablet (5 mg total) by mouth at bedtime as needed.  30 tablet  0   No current facility-administered medications for this encounter.   Facility-Administered Medications Ordered in Other Encounters  Medication Dose Route Frequency Provider Last Rate Last Dose  . topical emolient (BIAFINE) emulsion   Topical Daily Oneita Hurt, MD        Physical Findings: The patient is in no acute distress. Patient is alert and oriented.  weight is 165 lb 4.8 oz (74.98 kg). Her oral temperature is 98.3 F (36.8 C). Her blood pressure is 117/65 and her pulse is 82. Her respiration is 20. Marland Kitchen Her treated breast  shows erythema and dry desquamation with no residual moist desquamation. Neurologically, patient is grossly intact although she does appear to be somewhat fatigued  No significant changes.  Impression:  The patient is recovering from the effects of radiation.  Her skin reaction is gradually recovering. Her persistent fatigue described above sounds to be somewhat pronounced. This could potentially be related to her recovery from radiotherapy and I suspect this would be the highest probability diagnosis. However, the coupling of morning nausea with pronounced fatigue raises some question of the neurologic etiology. Given the presenting finding of 10 positive lymph nodes, it may be possible for the patient to contact have metastatic disease within the neuraxis. Based on this, I would feel that a brain MRI will be appropriate to rule out intracranial involvement.  Plan:  I reassured the patient that her breast recovery appears to be on track. I also read try to reassure her that her fatigue is probably related to radiation recovery. However, in talking about the potential for brain involvement, patient was understandably upset about that possibility. However, she certainly understood the importance of checking. She'll undergo brain MRI that's normal, we'll see her back for routine followup in 6 months.   _____________________________________  Artist Pais. Kathrynn Running, M.D.

## 2012-09-02 NOTE — Progress Notes (Signed)
Pt states she continues to have dry skin on right breast, itchiness on "inside of right axilla, not the surface". She has not been applying lotion to right breast; advised she can apply lotion for dryness of skin. Pt denies pain, fatigue, loss of appetite. Wearing lymphedema sleeve on right arm. Pt taking Tamoxifen daily.

## 2012-09-06 ENCOUNTER — Ambulatory Visit
Admission: RE | Admit: 2012-09-06 | Discharge: 2012-09-06 | Disposition: A | Discharge status: Home / Self Care | Payer: 59 | Source: Ambulatory Visit | Attending: Radiation Oncology | Admitting: Radiation Oncology

## 2012-09-06 DIAGNOSIS — R5383 Other fatigue: Secondary | ICD-10-CM

## 2012-09-06 DIAGNOSIS — R110 Nausea: Secondary | ICD-10-CM

## 2012-09-06 DIAGNOSIS — C50919 Malignant neoplasm of unspecified site of unspecified female breast: Secondary | ICD-10-CM

## 2012-09-06 DIAGNOSIS — Z853 Personal history of malignant neoplasm of breast: Secondary | ICD-10-CM

## 2012-09-06 MED ORDER — GADOBENATE DIMEGLUMINE 529 MG/ML IV SOLN
15.0000 mL | Freq: Once | INTRAVENOUS | Status: AC | PRN
  Administered 2012-09-06: 15 mL via INTRAVENOUS

## 2012-09-09 ENCOUNTER — Ambulatory Visit: Payer: 59 | Admitting: Radiation Oncology

## 2012-09-22 ENCOUNTER — Encounter: Payer: Self-pay | Admitting: Physician Assistant

## 2012-09-22 ENCOUNTER — Other Ambulatory Visit (HOSPITAL_BASED_OUTPATIENT_CLINIC_OR_DEPARTMENT_OTHER): Payer: 59

## 2012-09-22 ENCOUNTER — Telehealth: Payer: Self-pay | Admitting: Oncology

## 2012-09-22 ENCOUNTER — Ambulatory Visit (HOSPITAL_BASED_OUTPATIENT_CLINIC_OR_DEPARTMENT_OTHER): Payer: 59 | Admitting: Physician Assistant

## 2012-09-22 VITALS — BP 108/73 | HR 83 | Temp 98.2°F | Resp 20 | Ht 64.0 in | Wt 165.3 lb

## 2012-09-22 DIAGNOSIS — C50919 Malignant neoplasm of unspecified site of unspecified female breast: Secondary | ICD-10-CM

## 2012-09-22 DIAGNOSIS — Z853 Personal history of malignant neoplasm of breast: Secondary | ICD-10-CM

## 2012-09-22 DIAGNOSIS — I890 Lymphedema, not elsewhere classified: Secondary | ICD-10-CM

## 2012-09-22 DIAGNOSIS — C773 Secondary and unspecified malignant neoplasm of axilla and upper limb lymph nodes: Secondary | ICD-10-CM

## 2012-09-22 LAB — CBC WITH DIFFERENTIAL/PLATELET
BASO%: 0.4 % (ref 0.0–2.0)
EOS%: 6.3 % (ref 0.0–7.0)
MCHC: 33.9 g/dL (ref 31.5–36.0)
MONO#: 0.6 10*3/uL (ref 0.1–0.9)
RBC: 4.6 10*6/uL (ref 3.70–5.45)
RDW: 13.5 % (ref 11.2–14.5)
WBC: 5.3 10*3/uL (ref 3.9–10.3)
lymph#: 1.1 10*3/uL (ref 0.9–3.3)
nRBC: 0 % (ref 0–0)

## 2012-09-22 MED ORDER — GABAPENTIN 300 MG PO CAPS: 300.0000 mg | ORAL_CAPSULE | Freq: Three times a day (TID) | ORAL | Status: DC

## 2012-09-22 NOTE — Telephone Encounter (Signed)
gve the pt her jan and April 2014 appt calendar along with the mammo appt in jan at [facility]

## 2012-09-22 NOTE — Progress Notes (Signed)
ID: [name]   DOB: 31-Dec-1965  MR#: [id number]  WUJ#:[id number]  HISTORY OF PRESENT ILLNESS: Christina Blackwell is a 46 year old [place] woman formerly followed here for a left-sided stage I invasive ductal carcinoma, which was treated with adjuvant chemotherapy completed in 2003. She was released from followup last year.  On 12/28/2011 she had bilateral screening mammography showing a possible area of distortion in the right breast. She was recalled for additional views 01/09/2012 and this confirmed an area of architectural distortion in the lateral aspect of the right breast measuring 2.9 cm. This was not palpable by exam. Biopsy was performed the same day and showed ([id number]) and invasive lobular breast cancer, e-cadherin negative, strongly estrogen receptor positive at 85%, progesterone receptor positive at 98%, with an MIB-1 of 9%, and no HER-2 amplification.  Patient is status post right lumpectomy and x-ray lymph node dissection 03/02/2012 for a T2 N2 invasive lobular breast carcinoma, grade 1. 10 of 10 lymph nodes were positive. Margins were close but negative. Her subsequent history is as detailed below  INTERVAL HISTORY:  [name] returns today for followup of her right breast carcinoma.  Interval history is remarkable for Christina Blackwell having started on tamoxifen on 08/11/2012. She is tolerating the medication well, with the exception of continued hot flashes which have actually worsened somewhat.  Otherwise interval history is also remarkable for Christina Blackwell having had a brain MRI in late September to followup on some fatigue, headaches, and nausea. Fortunately the brain MRI was normal. In the meanwhile, she is having less fatigued. She is exercising more regularly. The nausea resolved. She's had no abnormal headaches.   REVIEW OF SYSTEMS: Christina Blackwell has had no recent illnesses and denies fevers or chills. She continues to have frequent hot flashes, and has had no menstrual cycles. No signs of abnormal  bleeding. She's having a very difficult time sleeping at night, primarily because of the hot flashes. No nausea or change in bowel habits. No cough, shortness of breath, or chest pain.  No unusual myalgias, arthralgias, peripheral swelling, or peripheral neuropathy other than the chronic lymphadenopathy in the right upper extremity. She finds her compression sleeve somewhat helpful.   A detailed review of systems is otherwise noncontributory.   PAST MEDICAL HISTORY: Past Medical History  Diagnosis Date  . History of bilateral breast implants   . GERD (gastroesophageal reflux disease)   . Complication of anesthesia   . PONV (postoperative nausea and vomiting)   . History of radiation therapy 03/30/02-05/16/02    left breast   . History of chemotherapy     cyclosphosphamide/ doxorubicin  . Breast cancer 2002    left   . Breast cancer 2012    right  . Anxiety   . Depression   . History of chemotherapy 4th:June 13,2013    taxol  . History of radiation therapy 06/22/12 -08/06/12    right breast    PAST SURGICAL HISTORY: Past Surgical History  Procedure Date  . Portacath placement   . Portacath removal   . Tonsillectomy and adenoidectomy   . Breast lumpectomy 11/2001    left  . Sentinel lymph node biopsy 11/2001    left axillary  . Breast enhancement surgery 2003    bilateral  . Breast lumpectomy w/ needle localization 02/05/2012    right/ slnbx  . Mirena iud     still in place as of 06/09/12  . Axillary lymph node dissection 03/02/2012    Procedure: AXILLARY LYMPH NODE DISSECTION;  Surgeon: Kandis Cocking, MD;  Location: [facility];  Service: General;  Laterality: Right;    FAMILY HISTORY Family History  Problem Relation Age of Onset  . Cancer Mother     breast  . Cancer Paternal Aunt     breast  . Cancer Cousin     breast ca 52's 1st cousin    Gynecologic history:  Menarche age 48, she is GX P0. She is currently using a Mirena implant  and is considering  bilateral salpingo-oophorectomy.  Social history:  She works for [employer], but her job was recently eliminated, and she is temporarily employed by them at present. She is divorced and lives by herself, with 2 birds, an [place] parrot and a cockateal. 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   ADVANCED DIRECTIVES: in place   HEALTH MAINTENANCE: History  Substance Use Topics  . Smoking status: Never Smoker   . Smokeless tobacco: Not on file  . Alcohol Use: Yes     social occasio0nally 1-2      Colonoscopy:  PAP:  Bone density:  Lipid panel:  No Known Allergies  Current Outpatient Prescriptions  Medication Sig Dispense Refill  . cetirizine (ZYRTEC) 10 MG tablet Take 10 mg by mouth as needed.      Marland Kitchen ibuprofen (ADVIL,MOTRIN) 200 MG tablet Take 200 mg by mouth every 6 (six) hours as needed.      . Multiple Vitamin (MULTIVITAMIN) tablet Take 1 tablet by mouth daily.      Marland Kitchen omeprazole (PRILOSEC) 20 MG capsule Take 20 mg by mouth daily.      . tamoxifen (NOLVADEX) 20 MG tablet Take 20 mg by mouth daily.      Marland Kitchen zolpidem (AMBIEN) 5 MG tablet Take 1 tablet (5 mg total) by mouth at bedtime as needed.  30 tablet  0  . gabapentin (NEURONTIN) 300 MG capsule Take 1 capsule (300 mg total) by mouth 3 (three) times daily.  30 capsule  6  . LORazepam (ATIVAN) 0.5 MG tablet Take 1 tablet (0.5 mg total) by mouth 2 (two) times daily as needed for anxiety.  45 tablet  0   No current facility-administered medications for this visit.   Facility-Administered Medications Ordered in Other Visits  Medication Dose Route Frequency Provider Last Rate Last Dose  . topical emolient (BIAFINE) emulsion   Topical Daily Oneita Hurt, MD        OBJECTIVE: Middle-aged white woman who appears mildly anxious, but in no acute distress. Filed Vitals:   09/22/12 1518  BP: 108/73  Pulse: 83  Temp: 98.2 F (36.8 C)  Resp: 20     Body mass index is 28.37 kg/(m^2).    ECOG FS: 1  Filed Weights   09/22/12  1518  Weight: 165 lb 4.8 oz (74.98 kg)   Physical Exam: HEENT:  Sclerae anicteric.  Oropharynx clear.   Nodes:  No cervical or supraclavicular lymphadenopathy palpated.  Breast Exam:  Right breast is status post lumpectomy and radiation therapy with some mild hyperpigmentation. No suspicious nodularity or evidence of local recurrence. Left breast is also status post lumpectomy with no evidence of local recurrence. Axillae are benign bilaterally, no adenopathy. Lungs:  Clear to auscultation bilaterally.  No crackles, rhonchi, or wheezes.   Heart:  Regular rate and rhythm.   Abdomen:  Soft, nontender.  Positive bowel sounds. Musculoskeletal:  No focal spinal tenderness to palpation.  Extremities: Compression sleeve intact in the right upper extremity with mild lymphedema.   No additional peripheral edema.  Neuro:  Nonfocal. Alert and oriented x3.    LAB RESULTS: Lab Results  Component Value Date   WBC 5.3 09/22/2012   NEUTROABS 3.2 09/22/2012   HGB 13.8 09/22/2012   HCT 40.7 09/22/2012   MCV 88.5 09/22/2012   PLT 179 09/22/2012      Chemistry      Component Value Date/Time   NA 140 08/11/2012 1501   NA 140 06/30/2012 0841   K 3.8 08/11/2012 1501   K 4.0 06/30/2012 0841   CL 109* 08/11/2012 1501   CL 108 06/30/2012 0841   CO2 23 08/11/2012 1501   CO2 25 06/30/2012 0841   BUN 17.0 08/11/2012 1501   BUN 11 06/30/2012 0841   CREATININE 0.8 08/11/2012 1501   CREATININE 0.75 06/30/2012 0841      Component Value Date/Time   CALCIUM 9.1 08/11/2012 1501   CALCIUM 9.3 06/30/2012 0841   ALKPHOS 73 08/11/2012 1501   ALKPHOS 47 06/30/2012 0841   AST 29 08/11/2012 1501   AST 16 06/30/2012 0841   ALT 43 08/11/2012 1501   ALT 14 06/30/2012 0841   BILITOT 0.30 08/11/2012 1501   BILITOT 0.5 06/30/2012 0841       Lab Results  Component Value Date   LABCA2 15 08/11/2012     STUDIES:  Next annual mammogram is due in January 2014.   Mr Laqueta Jean Wo Contrast  09/06/2012  *RADIOLOGY REPORT*  Clinical  Data:  History of breast carcinoma. Evaluate for metastatic disease.  Nausea  MRI HEAD WITHOUT AND WITH CONTRAST  Technique:  Multiplanar, multiecho pulse sequences of the brain and surrounding structures were obtained according to standard protocol without and with intravenous contrast  Contrast: 15mL MULTIHANCE GADOBENATE DIMEGLUMINE 529 MG/ML IV SOLN  Comparison: None.  Findings:  Gray-white matter differentiation is normal.  The sella and the clivus appear  normal.  There is asymmetric inferior displacement of the right cerebellar tonsil of  approximately 4.2 mm below the level foramen magnum.  The odontoid process, predental space and the pre vertebral soft tissues are within normal limits.  No diffusion weighted abnormalities seen.  Axial FLAIR and T2-weighted images demonstrate the brain substance to be normal.  Mastoids are clear.  The internal auditory canals are symmetrical in signal and  morphology.  Flow voids are maintained in the major vessels at the cranial skull base.  The paranasal sinuses are well-aerated.  The orbital contents appear to be grossly normal.  No abnormal blood breakdown products seen on SPGR sequences.  No pathological intracranial enhancement seen.  Dural venous sinuses are grossly patent.  Impression 1.  No pathological intracranial enhancement to suggest metastatic disease. 2.  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   Original Report Authenticated By: Oneal Grout, M.D.     ASSESSMENT: 46 y.o.  BRCA 1-2 negative [place] woman  (1) status post left lumpectomy and sentinel lymph node biopsy December 2002 for a 1.9 cm invasive ductal carcinoma, no lymph node involvement, triple negative, treated with 4 cycles of Cytoxan and Adriamycin followed by radiation, with no evidence of disease recurrence to date.  (2) status post Right lumpectomy and axillary lymph node dissection 03/02/2012 for a T2  N2 invasive lobular breast cancer, grade 1, estrogen receptor 85% and progesterone receptor 98% positive, with HER-2 nonamplified and an MIB-1 of 9%; 10 of 10 lymph nodes involved. (3)  completed 4 cycles of docetaxel/cyclophosphamide 05/27/2012 (4)  Status post radiation therapy,completed late August 2013, after which she started on tamoxifen, 20 mg daily (5)  lymphedema, right upper extremity   PLAN: Hannah is doing well from a breast cancer point of view. She will continue on tamoxifen at 20 mg daily. I am prescribing gabapentin 300 mg at bedtime to help with both the insomnia and hot flashes. She will let me know if this drug is not helpful for her.  Tollie is due for her next annual mammogram in January, after which she will return here for repeat labs and followup exam. She'll see Dr. Darnelle Catalan 3 months later, and when I see her in January we will discuss whether or not to order staging scans in March or April, at her 1 year anniversary. She voices her understanding and agreement with our plan, and knows to call with any problems or questions prior to her next scheduled appointment.    Crystalee Ventress    09/22/2012

## 2013-01-03 ENCOUNTER — Ambulatory Visit
Admission: RE | Admit: 2013-01-03 | Discharge: 2013-01-03 | Disposition: A | Discharge status: Home / Self Care | Payer: 59 | Source: Ambulatory Visit | Attending: Physician Assistant | Admitting: Physician Assistant

## 2013-01-03 ENCOUNTER — Other Ambulatory Visit: Payer: Self-pay | Admitting: Physician Assistant

## 2013-01-03 DIAGNOSIS — C50919 Malignant neoplasm of unspecified site of unspecified female breast: Secondary | ICD-10-CM

## 2013-01-03 DIAGNOSIS — Z853 Personal history of malignant neoplasm of breast: Secondary | ICD-10-CM

## 2013-01-10 ENCOUNTER — Encounter: Payer: Self-pay | Admitting: Physician Assistant

## 2013-01-10 ENCOUNTER — Other Ambulatory Visit (HOSPITAL_BASED_OUTPATIENT_CLINIC_OR_DEPARTMENT_OTHER): Payer: 59 | Admitting: Lab

## 2013-01-10 ENCOUNTER — Telehealth: Payer: Self-pay | Admitting: Oncology

## 2013-01-10 ENCOUNTER — Ambulatory Visit (HOSPITAL_BASED_OUTPATIENT_CLINIC_OR_DEPARTMENT_OTHER): Payer: 59 | Admitting: Physician Assistant

## 2013-01-10 VITALS — BP 114/76 | HR 83 | Temp 98.6°F | Resp 20 | Ht 64.0 in | Wt 167.2 lb

## 2013-01-10 DIAGNOSIS — C50919 Malignant neoplasm of unspecified site of unspecified female breast: Secondary | ICD-10-CM

## 2013-01-10 DIAGNOSIS — F411 Generalized anxiety disorder: Secondary | ICD-10-CM

## 2013-01-10 DIAGNOSIS — I890 Lymphedema, not elsewhere classified: Secondary | ICD-10-CM

## 2013-01-10 DIAGNOSIS — Z853 Personal history of malignant neoplasm of breast: Secondary | ICD-10-CM

## 2013-01-10 DIAGNOSIS — G4700 Insomnia, unspecified: Secondary | ICD-10-CM

## 2013-01-10 DIAGNOSIS — F419 Anxiety disorder, unspecified: Secondary | ICD-10-CM

## 2013-01-10 LAB — COMPREHENSIVE METABOLIC PANEL (CC13)
AST: 16 U/L (ref 5–34)
Albumin: 3.6 g/dL (ref 3.5–5.0)
Alkaline Phosphatase: 69 U/L (ref 40–150)
Calcium: 9.2 mg/dL (ref 8.4–10.4)
Chloride: 107 mEq/L (ref 98–107)
Glucose: 101 mg/dl — ABNORMAL HIGH (ref 70–99)
Potassium: 3.8 mEq/L (ref 3.5–5.1)
Sodium: 141 mEq/L (ref 136–145)
Total Protein: 7 g/dL (ref 6.4–8.3)

## 2013-01-10 LAB — CBC WITH DIFFERENTIAL/PLATELET
BASO%: 0.3 % (ref 0.0–2.0)
Basophils Absolute: 0 10*3/uL (ref 0.0–0.1)
EOS%: 2.3 % (ref 0.0–7.0)
HCT: 41.5 % (ref 34.8–46.6)
HGB: 14.2 g/dL (ref 11.6–15.9)
MCH: 31 pg (ref 25.1–34.0)
MONO#: 0.6 10*3/uL (ref 0.1–0.9)
NEUT%: 64.7 % (ref 38.4–76.8)
RDW: 13.1 % (ref 11.2–14.5)
WBC: 6.7 10*3/uL (ref 3.9–10.3)
lymph#: 1.7 10*3/uL (ref 0.9–3.3)

## 2013-01-10 MED ORDER — TAMOXIFEN CITRATE 20 MG PO TABS: 20.0000 mg | ORAL_TABLET | Freq: Every day | ORAL | Status: DC

## 2013-01-10 MED ORDER — LORAZEPAM 0.5 MG PO TABS: 0.5000 mg | ORAL_TABLET | Freq: Two times a day (BID) | ORAL | Status: DC | PRN

## 2013-01-10 NOTE — Progress Notes (Signed)
ID: DELTHA BERNALES   DOB: 1966-11-03  MR#: [id number]  CSN#:[id number]  HISTORY OF PRESENT ILLNESS: Ione is a 47 year old [place] woman formerly followed here for a left-sided stage I invasive ductal carcinoma, which was treated with adjuvant chemotherapy completed in 2003. She was released from followup last year.  On 12/28/2011 she had bilateral screening mammography showing a possible area of distortion in the right breast. She was recalled for additional views 01/09/2012 and this confirmed an area of architectural distortion in the lateral aspect of the right breast measuring 2.9 cm. This was not palpable by exam. Biopsy was performed the same day and showed ([id number]) and invasive lobular breast cancer, e-cadherin negative, strongly estrogen receptor positive at 85%, progesterone receptor positive at 98%, with an MIB-1 of 9%, and no HER-2 amplification.  Patient is status post right lumpectomy and x-ray lymph node dissection 03/02/2012 for a T2 N2 invasive lobular breast carcinoma, grade 1. 10 of 10 lymph nodes were positive. Margins were close but negative. Her subsequent history is as detailed below  INTERVAL HISTORY:  Neliah returns today for followup of her right breast carcinoma.  Jessikah continues on tamoxifen, 20 mg daily which she is tolerating well. She continues to have hot flashes and get some relief at night with gabapentin, 300 mg. She continues to have quite a bit of anxiety, anxious about the possibility of disease recurrence. She takes lorazepam appropriately, 0.5 mg occasionally during the day, but primarily at night to help her sleep.   Interval history is remarkable for Monzerrat having recently had her annual mammogram. A right ultrasound was also obtained, confirming a large seroma in the left breast. Otherwise, the mammogram and ultrasound were unremarkable.  Korene continues to work full-time for [employer] in [place].  She's commuting between her home in [place]  (where she lives near family) and her job in [place].  REVIEW OF SYSTEMS: Calianne has had no recent illnesses and denies fevers or chills. She continues to have frequent hot flashes, and has had no menstrual cycles. No signs of abnormal bleeding. No signs of abnormal clotting. She continues to have chronic lymphedema in the right upper extremity, but has had no additional edema elsewhere. She denies nausea or change in bowel habits. No cough, shortness of breath, or chest pain.  No unusual myalgias, arthralgias, or peripheral neuropathy she also denies any abnormal headaches, dizziness, or change in vision.    A detailed review of systems is otherwise noncontributory.   PAST MEDICAL HISTORY: Past Medical History  Diagnosis Date  . History of bilateral breast implants   . GERD (gastroesophageal reflux disease)   . Complication of anesthesia   . PONV (postoperative nausea and vomiting)   . History of radiation therapy 03/30/02-05/16/02    left breast   . History of chemotherapy     cyclosphosphamide/ doxorubicin  . Breast cancer 2002    left   . Breast cancer 2012    right  . Anxiety   . Depression   . History of chemotherapy 4th:June 13,2013    taxol  . History of radiation therapy 06/22/12 -08/06/12    right breast    PAST SURGICAL HISTORY: Past Surgical History  Procedure Date  . Portacath placement   . Portacath removal   . Tonsillectomy and adenoidectomy   . Breast lumpectomy 11/2001    left  . Sentinel lymph node biopsy 11/2001    left axillary  . Breast enhancement surgery 2003    bilateral  . Breast  lumpectomy w/ needle localization 02/05/2012    right/ slnbx  . Mirena iud     still in place as of 06/09/12  . Axillary lymph node dissection 03/02/2012    Procedure: AXILLARY LYMPH NODE DISSECTION;  Surgeon: Kandis Cocking, MD;  Location: [facility];  Service: General;  Laterality: Right;    FAMILY HISTORY Family History  Problem Relation Age of Onset   . Cancer Mother     breast  . Cancer Paternal Aunt     breast  . Cancer Cousin     breast ca 24's 1st cousin    Gynecologic history:  Menarche age 78, she is GX P0. She is currently using a Mirena implant  and is considering bilateral salpingo-oophorectomy.  Social history:  She works for [employer], but her job was recently eliminated, and she is temporarily employed by them at present. She is divorced and lives by herself, with 2 birds, an [place] parrot and a cockateal. 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   ADVANCED DIRECTIVES: in place   HEALTH MAINTENANCE: History  Substance Use Topics  . Smoking status: Never Smoker   . Smokeless tobacco: Not on file  . Alcohol Use: Yes     Comment: social occasio0nally 1-2      Colonoscopy:  PAP:  Bone density:  Lipid panel:  No Known Allergies  Current Outpatient Prescriptions  Medication Sig Dispense Refill  . cetirizine (ZYRTEC) 10 MG tablet Take 10 mg by mouth as needed.      . gabapentin (NEURONTIN) 300 MG capsule Take 1 capsule (300 mg total) by mouth 3 (three) times daily.  30 capsule  6  . ibuprofen (ADVIL,MOTRIN) 200 MG tablet Take 200 mg by mouth every 6 (six) hours as needed.      Marland Kitchen LORazepam (ATIVAN) 0.5 MG tablet Take 1 tablet (0.5 mg total) by mouth 2 (two) times daily as needed for anxiety.  45 tablet  0  . Multiple Vitamin (MULTIVITAMIN) tablet Take 1 tablet by mouth daily.      Marland Kitchen omeprazole (PRILOSEC) 20 MG capsule Take 20 mg by mouth daily.      . tamoxifen (NOLVADEX) 20 MG tablet Take 1 tablet (20 mg total) by mouth daily.  30 tablet  5   No current facility-administered medications for this visit.   Facility-Administered Medications Ordered in Other Visits  Medication Dose Route Frequency Provider Last Rate Last Dose  . topical emolient (BIAFINE) emulsion   Topical Daily Oneita Hurt, MD        OBJECTIVE: Middle-aged white woman who appears mildly anxious, but in no acute distress. Filed Vitals:    01/10/13 1510  BP: 114/76  Pulse: 83  Temp: 98.6 F (37 C)  Resp: 20     Body mass index is 28.70 kg/(m^2).    ECOG FS: 1  Filed Weights   01/10/13 1510  Weight: 167 lb 3.2 oz (75.841 kg)   Physical Exam: HEENT:  Sclerae anicteric.  Oropharynx clear.   Nodes:  No cervical or supraclavicular lymphadenopathy palpated.  Breast Exam:  Right breast is status post lumpectomy and radiation therapy with some mild hyperpigmentation. Firmness is palpated in the lateral portion of the breast, consistent with the large seroma noted by mammogram. No  evidence of local recurrence. Left breast is also status post lumpectomy with no evidence of local recurrence. Axillae are benign bilaterally, no adenopathy palpated. Lungs:  Clear to auscultation bilaterally.  No crackles, rhonchi, or wheezes.  Heart:  Regular rate and rhythm.   Abdomen:  Soft, nontender.  Positive bowel sounds. Musculoskeletal:  No focal spinal tenderness to palpation.  Extremities: Compression sleeve intact in the right upper extremity with mild lymphedema.   No additional peripheral edema.  Neuro:  Nonfocal. Well oriented.   LAB RESULTS: Lab Results  Component Value Date   WBC 6.7 01/10/2013   NEUTROABS 4.4 01/10/2013   HGB 14.2 01/10/2013   HCT 41.5 01/10/2013   MCV 90.5 01/10/2013   PLT 191 01/10/2013      Chemistry      Component Value Date/Time   NA 141 01/10/2013 1454   NA 140 06/30/2012 0841   K 3.8 01/10/2013 1454   K 4.0 06/30/2012 0841   CL 107 01/10/2013 1454   CL 108 06/30/2012 0841   CO2 25 01/10/2013 1454   CO2 25 06/30/2012 0841   BUN 17.7 01/10/2013 1454   BUN 11 06/30/2012 0841   CREATININE 1.0 01/10/2013 1454   CREATININE 0.75 06/30/2012 0841      Component Value Date/Time   CALCIUM 9.2 01/10/2013 1454   CALCIUM 9.3 06/30/2012 0841   ALKPHOS 69 01/10/2013 1454   ALKPHOS 47 06/30/2012 0841   AST 16 01/10/2013 1454   AST 16 06/30/2012 0841   ALT 21 01/10/2013 1454   ALT 14 06/30/2012 0841   BILITOT 0.20  01/10/2013 1454   BILITOT 0.5 06/30/2012 0841       Lab Results  Component Value Date   LABCA2 15 08/11/2012     STUDIES:   Mm Digital Diagnostic Bilat  01/03/2013  *RADIOLOGY REPORT*  Clinical Data:  The patient underwent left lumpectomy, chemotherapy and radiation therapy for breast cancer in 2002/2003. She underwent right lumpectomy, axillary dissection and radiation therapy for breast cancer in 2013.  DIGITAL DIAGNOSTIC BILATERAL MAMMOGRAM WITH CAD AND RIGHT BREAST ULTRASOUND:  Comparison:  12/31/2011, 12/10/2010, 12/06/2009  Findings:  ACR Breast Density Category 1: The breast tissue is almost entirely fatty.  There are subpectoral saline implants bilaterally.  Lumpectomy changes are noted bilaterally.  There is no suspicious dominant mass, nonsurgical architectural distortion or calcification to suggest malignancy on the left.  There is a large mass with smooth borders in the lumpectomy site on the right which would be consistent with a large seroma.  No other abnormality is noted on the right.  Mammographic images were processed with CAD.  On physical exam, there is a firm mass between 9 and 10 o'clock on the right. There is no sign of infection.  Ultrasound is performed, showing a seroma at 9-10 o'clock 5 cm from the right nipple measuring 5.8 x 3.7 x 5.6 cm.  IMPRESSION: Large right seroma.  No mammographic or sonographic evidence malignancy.  RECOMMENDATION: Yearly diagnostic mammography is suggested.  I have discussed the findings and recommendations with the patient. Results were also provided in writing at the conclusion of the visit.  BI-RADS CATEGORY 2:  Benign finding(s).   Original Report Authenticated By: Cain Saupe, M.D.       ASSESSMENT: 47 y.o.  BRCA 1-2 negative [place] woman  (1) status post left lumpectomy and sentinel lymph node biopsy December 2002 for a 1.9 cm invasive ductal carcinoma, no lymph node involvement, triple negative, treated with 4 cycles of Cytoxan  and Adriamycin followed by radiation, with no evidence of disease recurrence to date.  (2) status post Right lumpectomy and axillary lymph node dissection 03/02/2012 for a T2 N2 invasive lobular breast cancer, grade 1,  estrogen receptor 85% and progesterone receptor 98% positive, with HER-2 nonamplified and an MIB-1 of 9%; 10 of 10 lymph nodes involved. (3)  completed 4 cycles of docetaxel/cyclophosphamide 05/27/2012 (4)  Status post radiation therapy,completed late August 2013, after which  (5)  she started on tamoxifen, 20 mg daily (6)  lymphedema, right upper extremity   PLAN: Jaleeyah continues to do well from a breast cancer point of view. She will continue on tamoxifen at 20 mg daily. She'll continue taking gabapentin, primarily at bedtime to help with insomnia and hot flashes. I am refilling her prescription for lorazepam 0.5 mg which she takes for anxiety and anxiety induced insomnia.   Anyiah will return to see Dr. Darnelle Catalan in 3 months, and just prior to that visit will have a restaging PET scan at her 1 year anniversary. She voices her understanding and agreement with our plan, and knows to call with any problems or questions prior to her next scheduled appointment.    Aliana Kreischer    01/10/2013

## 2013-01-10 NOTE — Telephone Encounter (Signed)
gv pt appt schedule for April. Pt aware central will call w/pet scan appt.

## 2013-01-19 ENCOUNTER — Ambulatory Visit (INDEPENDENT_AMBULATORY_CARE_PROVIDER_SITE_OTHER): Payer: 59 | Admitting: General Surgery

## 2013-01-19 ENCOUNTER — Encounter (INDEPENDENT_AMBULATORY_CARE_PROVIDER_SITE_OTHER): Payer: Self-pay | Admitting: General Surgery

## 2013-01-19 VITALS — BP 130/76 | HR 76 | Temp 98.0°F | Resp 18 | Ht 65.0 in | Wt 168.0 lb

## 2013-01-19 DIAGNOSIS — IMO0002 Reserved for concepts with insufficient information to code with codable children: Secondary | ICD-10-CM | POA: Insufficient documentation

## 2013-01-19 NOTE — Assessment & Plan Note (Signed)
Seroma is aspirated. 50 mL of bloody fluid were aspirated.  Patient is to follow up with me in 6 months. She has an appointment with Dr. Darnelle Catalan in 3 months with PET scan.  No clinical evidence of disease.

## 2013-01-19 NOTE — Progress Notes (Signed)
HISTORY: Pt is 9-10 months s/p right lumpectomy/ ALND for stage 3 breast cancer with Dr. Ezzard Standing.  She recently had her mammogram and a large seroma was demonstrated 01/03/2013.  She was not concerned until she started having significant drainage from nipple.  She denies fevers/ chills.  The right breast is tender and sore.  She is very anxious about her breast cancer coming back and nervous.     PERTINENT REVIEW OF SYSTEMS: Otherwise negative.     EXAM: Head: Normocephalic and atraumatic.  Eyes:  Conjunctivae are normal. Pupils are equal, round, and reactive to light. No scleral icterus.  Neck:  Normal range of motion. Neck supple. No tracheal deviation present. No thyromegaly present.  Resp: No respiratory distress, normal effort. Breast:  Right breast with clear discharge from nipple.  Seroma palpable at 9 oclock superficially.  This is cleaned, anesthetized, and aspirated for 57 mL bloody fluid.  No masses are palpated in the remainder of breast.   Abd:  Abdomen is soft, non distended and non tender.  Neurological: Alert and oriented to person, place, and time. Coordination normal.  Skin: Skin is warm and dry. No rash noted. No diaphoretic. No erythema. No pallor.  Psychiatric: Normal mood and affect. Normal behavior. Judgment and thought content normal.      ASSESSMENT AND PLAN:   Seroma Seroma is aspirated. 50 mL of bloody fluid were aspirated.  Patient is to follow up with me in 6 months. She has an appointment with Dr. Darnelle Catalan in 3 months with PET scan.  No clinical evidence of disease.       Maudry Diego, MD Surgical Oncology, General & Endocrine Surgery [facility]  Provider Not In System Magrinat, Valentino Hue, MD

## 2013-01-19 NOTE — Patient Instructions (Signed)
Will follow up in 6 months unless issues arise.  If seroma reoccurs, will make physical therapy appt for lymphedema.

## 2013-02-18 ENCOUNTER — Telehealth: Payer: Self-pay | Admitting: *Deleted

## 2013-02-18 NOTE — Telephone Encounter (Signed)
CALLED PATIENT TO ALTER FU VISIT FOR 03-03-13 DUE TO DR. BEING ON VACATION , RESCHEDULED FOR 03-10-13, PATIENT AGREED TO CHANGE.

## 2013-03-03 ENCOUNTER — Ambulatory Visit: Payer: 59 | Admitting: Radiation Oncology

## 2013-03-03 ENCOUNTER — Encounter: Payer: Self-pay | Admitting: Radiation Oncology

## 2013-03-10 ENCOUNTER — Ambulatory Visit: Admission: RE | Admit: 2013-03-10 | Payer: 59 | Source: Ambulatory Visit | Admitting: Radiation Oncology

## 2013-03-10 HISTORY — DX: Reserved for concepts with insufficient information to code with codable children: IMO0002

## 2013-03-28 ENCOUNTER — Other Ambulatory Visit (INDEPENDENT_AMBULATORY_CARE_PROVIDER_SITE_OTHER): Payer: Self-pay | Admitting: General Surgery

## 2013-03-28 ENCOUNTER — Ambulatory Visit (INDEPENDENT_AMBULATORY_CARE_PROVIDER_SITE_OTHER): Payer: 59 | Admitting: General Surgery

## 2013-03-28 ENCOUNTER — Encounter (INDEPENDENT_AMBULATORY_CARE_PROVIDER_SITE_OTHER): Payer: Self-pay | Admitting: General Surgery

## 2013-03-28 VITALS — BP 119/66 | HR 62 | Temp 97.5°F | Resp 12 | Ht 65.0 in | Wt 167.0 lb

## 2013-03-28 DIAGNOSIS — N611 Abscess of the breast and nipple: Secondary | ICD-10-CM

## 2013-03-28 DIAGNOSIS — N61 Mastitis without abscess: Secondary | ICD-10-CM | POA: Insufficient documentation

## 2013-03-28 MED ORDER — HYDROCODONE-ACETAMINOPHEN 5-325 MG PO TABS: 1.0000 | ORAL_TABLET | ORAL | Status: DC | PRN

## 2013-03-28 MED ORDER — SULFAMETHOXAZOLE-TRIMETHOPRIM 800-160 MG PO TABS: 1.0000 | ORAL_TABLET | Freq: Two times a day (BID) | ORAL | Status: DC

## 2013-03-28 NOTE — Patient Instructions (Signed)
Take it easy on right arm.  Use ice packs or warm compresses.    Call if fevers, or worsening pain

## 2013-03-29 NOTE — Progress Notes (Signed)
Subjective:     Patient ID: Christina Blackwell, female   DOB: 04/04/1966, 47 y.o.   MRN: [id number]  HPI Pt is 47 yo F now 1 year s/p right BCT/ALND.  She has been wearing her compression sleeve, and she has had some issues with lymphedema.  These had been stable until she did a lot of yardwork this weekend.  Her right breast got very swollen, and her skin got extremely red.  The breast is so sore, she can barely stand to have anything touch it.    Review of Systems  All other systems reviewed and are negative.       Objective:   Physical Exam  Constitutional: She is oriented to person, place, and time. She appears well-developed and well-nourished. She appears distressed.  HENT:  Head: Normocephalic and atraumatic.  Eyes: Conjunctivae are normal. Pupils are equal, round, and reactive to light. No scleral icterus.  Neck: Normal range of motion. Neck supple.  Cardiovascular: Normal rate.   Pulmonary/Chest: Effort normal. No respiratory distress.    Large circle of redness of breast/chest wall.  Incision in lower outer quadrant redder than surrounding skin.    Abdominal: Soft.  Musculoskeletal: Normal range of motion.  Neurological: She is alert and oriented to person, place, and time.  Skin: Skin is warm. She is not diaphoretic. There is erythema.  Psychiatric: She has a normal mood and affect. Her behavior is normal. Judgment and thought content normal.       Assessment/Plan:     Mastitis Pt placed on antibiotics and large recurrent seroma aspirated (90 mL)  She was advised that if this did not rapidly improve, she may need IV antibiotics.  She was advised to call if that was the case.    I will see her back in 1 week.    She was given scripts for 10 days bactrim and 40 vicodin.

## 2013-03-29 NOTE — Assessment & Plan Note (Signed)
Pt placed on antibiotics and large recurrent seroma aspirated (90 mL)  She was advised that if this did not rapidly improve, she may need IV antibiotics.  She was advised to call if that was the case.    I will see her back in 1 week.

## 2013-03-30 ENCOUNTER — Telehealth: Payer: Self-pay | Admitting: *Deleted

## 2013-03-30 NOTE — Telephone Encounter (Signed)
Pt called to get an am appt for lab. gv appt d/t.Marland Kitchentd

## 2013-03-31 ENCOUNTER — Ambulatory Visit (INDEPENDENT_AMBULATORY_CARE_PROVIDER_SITE_OTHER): Payer: 59 | Admitting: General Surgery

## 2013-03-31 ENCOUNTER — Encounter (INDEPENDENT_AMBULATORY_CARE_PROVIDER_SITE_OTHER): Payer: Self-pay | Admitting: General Surgery

## 2013-03-31 VITALS — BP 110/78 | HR 80 | Temp 97.4°F | Resp 18 | Ht 65.0 in | Wt 170.2 lb

## 2013-03-31 DIAGNOSIS — N61 Mastitis without abscess: Secondary | ICD-10-CM

## 2013-03-31 LAB — WOUND CULTURE

## 2013-03-31 MED ORDER — TRAMADOL HCL 50 MG PO TABS: 50.0000 mg | ORAL_TABLET | Freq: Four times a day (QID) | ORAL | Status: DC | PRN

## 2013-03-31 MED ORDER — DOXYCYCLINE HYCLATE 100 MG PO TABS: 100.0000 mg | ORAL_TABLET | Freq: Two times a day (BID) | ORAL | Status: DC

## 2013-03-31 NOTE — Patient Instructions (Signed)
Go to the emergency room this weekend if the situation gets worse.

## 2013-03-31 NOTE — Progress Notes (Signed)
Subjective:     Patient ID: Christina Blackwell, female   DOB: 1966-03-10, 47 y.o.   MRN: [id number]  HPI  She is here because she's had reaccumulation of the seroma. She was seen 4 days ago and had 90 cc of fluid aspirated. She was placed on Bactrim for her mastitis. She states the redness is no better but is no worse.  The right breast is very sore.   Review of SystemsNo fevers or chills.     Objective:   Physical Exam Right breast-well-healed scar. There is erythema similar to what was described 3 days ago. There is a tender area laterally. I sterilely aspirated 55 cc of thin bloody fluid that did not have an odor.    Assessment:     Right breast mastitis with reaccumulation of fluid that has been aspirated.     Plan:     Change antibiotics to oral doxycycline. Keep appointment with Dr. Donell Beers on April 22. If it gets worse this weekend I have instructed her to go to the emergency room

## 2013-04-04 ENCOUNTER — Encounter: Payer: Self-pay | Admitting: Lab

## 2013-04-04 ENCOUNTER — Ambulatory Visit (INDEPENDENT_AMBULATORY_CARE_PROVIDER_SITE_OTHER): Payer: 59 | Admitting: General Surgery

## 2013-04-04 ENCOUNTER — Encounter (INDEPENDENT_AMBULATORY_CARE_PROVIDER_SITE_OTHER): Payer: Self-pay

## 2013-04-04 ENCOUNTER — Encounter (INDEPENDENT_AMBULATORY_CARE_PROVIDER_SITE_OTHER): Payer: Self-pay | Admitting: General Surgery

## 2013-04-04 VITALS — BP 124/86 | HR 96 | Temp 97.5°F | Resp 16 | Ht 65.0 in | Wt 162.8 lb

## 2013-04-04 DIAGNOSIS — S21001D Unspecified open wound of right breast, subsequent encounter: Secondary | ICD-10-CM

## 2013-04-04 DIAGNOSIS — Z5189 Encounter for other specified aftercare: Secondary | ICD-10-CM

## 2013-04-04 NOTE — Progress Notes (Signed)
Subjective:     Patient ID: Christina Blackwell, female   DOB: [birth year], 47 y.o.   MRN: [id number]  HPI 71 yof who underwent lump/alnd (had re-excision) then had chemo/xrt.  She had significant lumpectomy it appears.  She has done well from breast cancer and has been followed by Dr Donell Beers although was initially cared for by Dr Ezzard Standing.  She had seroma which was drained in past but in last few weeks has seroma that has been drained three times.  Over the weekend she has increased drainage and she states wound has opened.  She has no fevers.  Has culture with some staph aureus recently.    Review of Systems     Objective:   Physical Exam Right breast with lateral incision and it appears medial aspect of this is draining and is 1x1 cm open, there is large cavity underlying this, I think she primarily has radiation changes in region of boost and not active infection at this site, nipple is pulled laterally    Assessment:     Open wound right breast     Plan:     She is on abx already.  This area is completely drained now. I think only thing to do is try to see if this will heal by secondary intention along with abx course.  I didn't pack today due to her pain and I don't think this will close easily.  I discussed with her the difficulty in healing with chronic seroma and cavity from surgery as well as xrt.  She will see Dr Donell Beers as scheduled this week.

## 2013-04-05 ENCOUNTER — Encounter (INDEPENDENT_AMBULATORY_CARE_PROVIDER_SITE_OTHER): Payer: Self-pay | Admitting: General Surgery

## 2013-04-05 ENCOUNTER — Other Ambulatory Visit: Payer: 59

## 2013-04-05 ENCOUNTER — Other Ambulatory Visit (HOSPITAL_BASED_OUTPATIENT_CLINIC_OR_DEPARTMENT_OTHER): Payer: 59 | Admitting: Lab

## 2013-04-05 ENCOUNTER — Ambulatory Visit (INDEPENDENT_AMBULATORY_CARE_PROVIDER_SITE_OTHER): Payer: 59 | Admitting: General Surgery

## 2013-04-05 VITALS — BP 114/78 | HR 78 | Temp 98.1°F | Resp 16 | Ht 65.0 in | Wt 163.0 lb

## 2013-04-05 DIAGNOSIS — C50919 Malignant neoplasm of unspecified site of unspecified female breast: Secondary | ICD-10-CM

## 2013-04-05 DIAGNOSIS — Z853 Personal history of malignant neoplasm of breast: Secondary | ICD-10-CM

## 2013-04-05 DIAGNOSIS — N61 Mastitis without abscess: Secondary | ICD-10-CM

## 2013-04-05 LAB — CBC WITH DIFFERENTIAL/PLATELET
Basophils Absolute: 0 10*3/uL (ref 0.0–0.1)
EOS%: 2.6 % (ref 0.0–7.0)
Eosinophils Absolute: 0.2 10*3/uL (ref 0.0–0.5)
HCT: 40.5 % (ref 34.8–46.6)
HGB: 13.3 g/dL (ref 11.6–15.9)
MCH: 29.9 pg (ref 25.1–34.0)
NEUT#: 4.4 10*3/uL (ref 1.5–6.5)
NEUT%: 70.9 % (ref 38.4–76.8)
RDW: 13.1 % (ref 11.2–14.5)
lymph#: 1.2 10*3/uL (ref 0.9–3.3)

## 2013-04-05 LAB — COMPREHENSIVE METABOLIC PANEL (CC13)
Albumin: 3.1 g/dL — ABNORMAL LOW (ref 3.5–5.0)
BUN: 15.4 mg/dL (ref 7.0–26.0)
CO2: 26 mEq/L (ref 22–29)
Calcium: 9.4 mg/dL (ref 8.4–10.4)
Chloride: 105 mEq/L (ref 98–107)
Creatinine: 0.8 mg/dL (ref 0.6–1.1)
Glucose: 110 mg/dl — ABNORMAL HIGH (ref 70–99)
Potassium: 3.8 mEq/L (ref 3.5–5.1)

## 2013-04-05 NOTE — Assessment & Plan Note (Signed)
I think that the patient's mastitis and seroma will resolve now that she has an opening in her skin. She is advised to finish the doxycycline.  She is advised to keep the wound dressed. She is posterior PET scan next week. I advised her to hold off on that until the inflammation has improved.  I think the results will be invalid and misleading. I sent a message to Dr. Darnelle Catalan and the breast navigator regarding this.

## 2013-04-05 NOTE — Patient Instructions (Signed)
Keep the breast wound covered.   It will continue to drain for a while.  Follow up with me in 2 weeks.  Finish antibiotics.

## 2013-04-05 NOTE — Progress Notes (Signed)
Subjective:     Patient ID: Christina Blackwell, female   DOB: 1966-02-25, 47 y.o.   MRN: [id number]  HPI 50 yof who underwent lump/alnd (had re-excision) then had chemo/xrt.  She had significant lumpectomy it appears.  She recently developed a significant seroma and mastitis following some work in her yard. She had to have the seroma aspirated 3 times. Eventually this weekend, the incision popped open and she drained copious amounts of fluid.  She was seen by Dr. Dwain Sarna yesterday in urgent office. He switched her from the Bactrim to doxycycline. She is denying fevers or chills. The skin is extremely tender.  The redness is improving.    Review of Systems     Objective:   Physical Exam Right breast with lateral incision opening.  There is some drainage, but minimal. She was unable to tolerate the tip of the q tip touching the skin.    ,    Assessment:     Open wound right breast     Plan:     Finish antibiotics.  Keep wound clean.    Wound will heal eventually by secondary intention, but it will take a while.  Cavity is quite large.

## 2013-04-11 ENCOUNTER — Encounter (HOSPITAL_COMMUNITY): Payer: 59

## 2013-04-11 ENCOUNTER — Ambulatory Visit: Payer: 59 | Admitting: Oncology

## 2013-04-11 ENCOUNTER — Other Ambulatory Visit: Payer: 59 | Admitting: Lab

## 2013-04-12 ENCOUNTER — Ambulatory Visit (HOSPITAL_BASED_OUTPATIENT_CLINIC_OR_DEPARTMENT_OTHER): Payer: 59 | Admitting: Oncology

## 2013-04-12 ENCOUNTER — Telehealth: Payer: Self-pay | Admitting: *Deleted

## 2013-04-12 VITALS — BP 124/76 | HR 82 | Temp 97.9°F | Resp 20 | Ht 65.0 in | Wt 166.8 lb

## 2013-04-12 DIAGNOSIS — I890 Lymphedema, not elsewhere classified: Secondary | ICD-10-CM

## 2013-04-12 DIAGNOSIS — C50919 Malignant neoplasm of unspecified site of unspecified female breast: Secondary | ICD-10-CM

## 2013-04-12 DIAGNOSIS — C50911 Malignant neoplasm of unspecified site of right female breast: Secondary | ICD-10-CM

## 2013-04-12 DIAGNOSIS — R7989 Other specified abnormal findings of blood chemistry: Secondary | ICD-10-CM

## 2013-04-12 DIAGNOSIS — F411 Generalized anxiety disorder: Secondary | ICD-10-CM

## 2013-04-12 DIAGNOSIS — F419 Anxiety disorder, unspecified: Secondary | ICD-10-CM

## 2013-04-12 MED ORDER — LORAZEPAM 0.5 MG PO TABS: 0.5000 mg | ORAL_TABLET | Freq: Two times a day (BID) | ORAL | Status: DC | PRN

## 2013-04-12 NOTE — Telephone Encounter (Signed)
appts made and printed...td 

## 2013-04-12 NOTE — Progress Notes (Signed)
ID: ALEXINE PILANT   DOB: [birth year]  MR#: [id number]  JYN#:[id number]  HISTORY OF PRESENT ILLNESS: Christina Blackwell is a 47 year old [place] woman formerly followed here for a left-sided stage I invasive ductal carcinoma, which was treated with adjuvant chemotherapy completed in 2003. She was released from followup last year.  On 12/28/2011 she had bilateral screening mammography showing a possible area of distortion in the right breast. She was recalled for additional views 01/09/2012 and this confirmed an area of architectural distortion in the lateral aspect of the right breast measuring 2.9 cm. This was not palpable by exam. Biopsy was performed the same day and showed ([id number]) and invasive lobular breast cancer, e-cadherin negative, strongly estrogen receptor positive at 85%, progesterone receptor positive at 98%, with an MIB-1 of 9%, and no HER-2 amplification.  Patient is status post right lumpectomy and x-ray lymph node dissection 03/02/2012 for a T2 N2 invasive lobular breast carcinoma, grade 1. 10 of 10 lymph nodes were positive. Margins were close but negative. Her subsequent history is as detailed below  INTERVAL HISTORY: Christina Blackwell returns today for followup of her right breast carcinoma. She was supposed to have had a restaging PET scan yesterday but she had problems with her right breast seroma, which was drained initially and then "burst" possibly when she put a little bit too much pressure on it. Wisely she decided that while having this inflammation a PET scan might be misleading. That accordingly has been postponed.  REVIEW OF SYSTEMS: This seroma no longer hurts but it is causing her a great deal of worry. It seems to have opened more than when she was last seen by the surgeon. There is no fever. There is minimal drainage. She's not sleeping well because of this. She continues to have "pretty horrible" hot flashes". She finds Ativan is helpful in terms of sleep and anxiety. She takes it  perhaps once a day sometimes a little less often. Otherwise a detailed review of systems today was entirely benign   PAST MEDICAL HISTORY: Past Medical History  Diagnosis Date  . History of bilateral breast implants   . GERD (gastroesophageal reflux disease)   . Complication of anesthesia   . PONV (postoperative nausea and vomiting)   . History of radiation therapy 03/30/02-05/16/02    left breast   . History of chemotherapy     cyclosphosphamide/ doxorubicin  . Breast cancer 2002    left   . Breast cancer 2012    right  . Anxiety   . Depression   . History of chemotherapy 4th:June 13,2013    taxol  . History of radiation therapy 06/22/12 -08/06/12    right breast  . Seroma 12/2012 u/s xray    right breast/aspiration 01/19/13    PAST SURGICAL HISTORY: Past Surgical History  Procedure Laterality Date  . Portacath placement    . Portacath removal    . Tonsillectomy and adenoidectomy    . Breast lumpectomy  11/2001    left  . Sentinel lymph node biopsy  11/2001    left axillary  . Breast enhancement surgery  2003    bilateral  . Breast lumpectomy w/ needle localization  02/05/2012    right/ slnbx  . Mirena iud      still in place as of 06/09/12  . Axillary lymph node dissection  03/02/2012    Procedure: AXILLARY LYMPH NODE DISSECTION;  Surgeon: Kandis Cocking, MD;  Location: [facility];  Service: General;  Laterality: Right;  FAMILY HISTORY Family History  Problem Relation Age of Onset  . Cancer Mother     breast  . Cancer Paternal Aunt     breast  . Cancer Cousin     breast ca 77's 1st cousin    Gynecologic history:  Menarche age 65, she is GX P0. She is currently using a Mirena implant  and is considering bilateral salpingo-oophorectomy.  Social history:  She works for [employer], but her full-time job was eliminated, and she is temporarily employed by them at present. She is divorced and lives by herself, with 2 birds, an [place] parrot  and a cockateal. 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   ADVANCED DIRECTIVES: in place   HEALTH MAINTENANCE: History  Substance Use Topics  . Smoking status: Never Smoker   . Smokeless tobacco: Not on file  . Alcohol Use: Yes     Comment: social occasio0nally 1-2      Colonoscopy:  PAP:  Bone density:  Lipid panel:  Allergies  Allergen Reactions  . Hydrocodone Itching    Current Outpatient Prescriptions  Medication Sig Dispense Refill  . cetirizine (ZYRTEC) 10 MG tablet Take 10 mg by mouth as needed.      . doxycycline (VIBRA-TABS) 100 MG tablet Take 1 tablet (100 mg total) by mouth 2 (two) times daily.  28 tablet  0  . gabapentin (NEURONTIN) 300 MG capsule Take 1 capsule (300 mg total) by mouth 3 (three) times daily.  30 capsule  6  . ibuprofen (ADVIL,MOTRIN) 200 MG tablet Take 200 mg by mouth every 6 (six) hours as needed.      Marland Kitchen LORazepam (ATIVAN) 0.5 MG tablet Take 1 tablet (0.5 mg total) by mouth 2 (two) times daily as needed for anxiety.  45 tablet  0  . Multiple Vitamin (MULTIVITAMIN) tablet Take 1 tablet by mouth daily.      Marland Kitchen omeprazole (PRILOSEC) 20 MG capsule Take 20 mg by mouth daily.      . tamoxifen (NOLVADEX) 20 MG tablet Take 1 tablet (20 mg total) by mouth daily.  30 tablet  5  . traMADol (ULTRAM) 50 MG tablet Take 1-2 tablets (50-100 mg total) by mouth every 6 (six) hours as needed for pain.  30 tablet  1   No current facility-administered medications for this visit.   Facility-Administered Medications Ordered in Other Visits  Medication Dose Route Frequency Provider Last Rate Last Dose  . topical emolient (BIAFINE) emulsion   Topical Daily Oneita Hurt, MD        OBJECTIVE: Middle-aged white woman who appears anxious Filed Vitals:   04/12/13 1555  BP: 124/76  Pulse: 82  Temp: 97.9 F (36.6 C)  Resp: 20     Body mass index is 27.76 kg/(m^2).    ECOG FS: 1  Filed Weights   04/12/13 1555  Weight: 166 lb 12.8 oz (75.66 kg)   Sclerae  unicteric Oropharynx clear No cervical or supraclavicular adenopathy Lungs no rales or rhonchi Heart regular rate and rhythm Abd benign MSK no focal spinal tenderness, no peripheral edema Neuro: nonfocal, well oriented Breasts: The right breast is status post right lumpectomy and radiation. There is a 2 x 1 cm oval opening below which appears to be a cavity measuring perhaps 4 x 3 cm. The base of the cavity appears pink. There is no suppuration. The right axilla is benign. The left breast is status post lumpectomy. There is no evidence of local recurrence.  LAB RESULTS: Lab  Results  Component Value Date   WBC 6.2 04/05/2013   NEUTROABS 4.4 04/05/2013   HGB 13.3 04/05/2013   HCT 40.5 04/05/2013   MCV 90.9 04/05/2013   PLT 322 04/05/2013      Chemistry      Component Value Date/Time   NA 141 04/05/2013 1157   NA 140 06/30/2012 0841   K 3.8 04/05/2013 1157   K 4.0 06/30/2012 0841   CL 105 04/05/2013 1157   CL 108 06/30/2012 0841   CO2 26 04/05/2013 1157   CO2 25 06/30/2012 0841   BUN 15.4 04/05/2013 1157   BUN 11 06/30/2012 0841   CREATININE 0.8 04/05/2013 1157   CREATININE 0.75 06/30/2012 0841      Component Value Date/Time   CALCIUM 9.4 04/05/2013 1157   CALCIUM 9.3 06/30/2012 0841   ALKPHOS 71 04/05/2013 1157   ALKPHOS 47 06/30/2012 0841   AST 52* 04/05/2013 1157   AST 16 06/30/2012 0841   ALT 127* 04/05/2013 1157   ALT 14 06/30/2012 0841   BILITOT 0.23 04/05/2013 1157   BILITOT 0.5 06/30/2012 0841       Lab Results  Component Value Date   LABCA2 18 01/10/2013     STUDIES:   Mm Digital Diagnostic Bilat  01/03/2013  *RADIOLOGY REPORT*  Clinical Data:  The patient underwent left lumpectomy, chemotherapy and radiation therapy for breast cancer in 2002/2003. She underwent right lumpectomy, axillary dissection and radiation therapy for breast cancer in 2013.  DIGITAL DIAGNOSTIC BILATERAL MAMMOGRAM WITH CAD AND RIGHT BREAST ULTRASOUND:  Comparison:  12/31/2011, 12/10/2010, 12/06/2009   Findings:  ACR Breast Density Category 1: The breast tissue is almost entirely fatty.  There are subpectoral saline implants bilaterally.  Lumpectomy changes are noted bilaterally.  There is no suspicious dominant mass, nonsurgical architectural distortion or calcification to suggest malignancy on the left.  There is a large mass with smooth borders in the lumpectomy site on the right which would be consistent with a large seroma.  No other abnormality is noted on the right.  Mammographic images were processed with CAD.  On physical exam, there is a firm mass between 9 and 10 o'clock on the right. There is no sign of infection.  Ultrasound is performed, showing a seroma at 9-10 o'clock 5 cm from the right nipple measuring 5.8 x 3.7 x 5.6 cm.  IMPRESSION: Large right seroma.  No mammographic or sonographic evidence malignancy.  RECOMMENDATION: Yearly diagnostic mammography is suggested.  I have discussed the findings and recommendations with the patient. Results were also provided in writing at the conclusion of the visit.  BI-RADS CATEGORY 2:  Benign finding(s).   Original Report Authenticated By: Cain Saupe, M.D.       ASSESSMENT: 47 y.o.  BRCA 1-2 negative [place] woman  (1) status post left lumpectomy and sentinel lymph node biopsy December 2002 for a 1.9 cm invasive ductal carcinoma, no lymph node involvement, triple negative, treated with 4 cycles of Cytoxan and Adriamycin followed by radiation, with no evidence of disease recurrence to date.  (2) status post Right lumpectomy and axillary lymph node dissection 03/02/2012 for a T2 N2 invasive lobular breast cancer, grade 1, estrogen receptor 85% and progesterone receptor 98% positive, with HER-2 nonamplified and an MIB-1 of 9%; 10 of 10 lymph nodes involved. (3)  completed 4 cycles of docetaxel/cyclophosphamide 05/27/2012 (4)  Status post radiation therapy,completed late August 2013 (5)  she started on tamoxifen August 2013 (6)  lymphedema,  right upper extremity (7)  seroma cavity Right breast   PLAN: The right breast seroma area does not appear to be infected. I wonder if it should be packed, and I have sent a photo to Dr. Donell Beers. In the meantime Deyra will continue to dress it externally. We are postponing the PET scan for about 3 months, and if things have not completely settled down in the right breast area before then we will postpone it again.  I did alert Kameryn that her liver functions are slightly up. This is most commonly do to medications, or fatty infiltration of the liver, but in her case it needs to be followed and we will recheck this before she sees [place] again in 3 months from now. She knows to call for any problems that may develop before then.   Verline Kong C    04/12/2013

## 2013-04-13 ENCOUNTER — Other Ambulatory Visit: Payer: Self-pay | Admitting: Emergency Medicine

## 2013-04-13 ENCOUNTER — Telehealth: Payer: Self-pay | Admitting: Emergency Medicine

## 2013-04-13 MED ORDER — CLONIDINE HCL 0.1 MG PO TABS: 0.1000 mg | ORAL_TABLET | Freq: Every day | ORAL | Status: DC

## 2013-04-13 NOTE — Telephone Encounter (Signed)
Patient called requesting Dr Darnelle Catalan prescribe her the "BP" medication which he recommended for the hot flashes. Patient states she is only taking the Neurontin 300mg  at bedtime due to the drowsy effects she experienced during the day. Patient states her hot flashes are worse during the day. Called Clonidine in to her pharmacy per Dr Darrall Dears instruction. Patient states unsure if she will take this due to the decreased likelihood that is will help for her daytime hot flashes. Offered patient that she could try taking Neurontin 100mg  during the day starting on a weekend while she is off work. Patient declined that option as well. Patient encouraged to call this office for any other concerns.

## 2013-04-14 ENCOUNTER — Ambulatory Visit: Admission: RE | Admit: 2013-04-14 | Payer: 59 | Source: Ambulatory Visit | Admitting: Radiation Oncology

## 2013-04-19 ENCOUNTER — Encounter (INDEPENDENT_AMBULATORY_CARE_PROVIDER_SITE_OTHER): Payer: Self-pay | Admitting: General Surgery

## 2013-04-19 ENCOUNTER — Ambulatory Visit (INDEPENDENT_AMBULATORY_CARE_PROVIDER_SITE_OTHER): Payer: 59 | Admitting: General Surgery

## 2013-04-19 ENCOUNTER — Encounter (INDEPENDENT_AMBULATORY_CARE_PROVIDER_SITE_OTHER): Payer: 59 | Admitting: General Surgery

## 2013-04-19 VITALS — BP 114/80 | HR 75 | Temp 97.6°F | Ht 65.0 in | Wt 165.6 lb

## 2013-04-19 DIAGNOSIS — IMO0002 Reserved for concepts with insufficient information to code with codable children: Secondary | ICD-10-CM

## 2013-04-19 NOTE — Assessment & Plan Note (Signed)
The patient's wound does appear to be sticking down to the chest wall. I'm hopeful that this means that it will continue to heal without leaving a cavity.  The infection appears to be resolved. The tenderness is improved significantly. I advised her to shower with the dressings off daily.  I still think she would have significant difficulty packing this wound. I will see her back in 2 weeks.

## 2013-04-19 NOTE — Patient Instructions (Signed)
Remove dressing and let warm soapy water run over wound in shower.  Dress with dry dressing daily.  Follow up in 2 weeks.

## 2013-04-19 NOTE — Progress Notes (Signed)
Subjective:     Patient ID: Christina Blackwell, female   DOB: Apr 11, 1966, 47 y.o.   MRN: [id number]  HPI Pt is 47 yo F now 1 year s/p right BCT/ALND.  She developed a large seroma and significant mastitis. This required drainage multiple times. She then had her previous incision open up and drained copious amounts of fluid.  The opening was so incredibly tender that she could not tolerate even a Q-tip touching it. She had a large cavity underneath the skin.  She has continued just to place a dry dressing over it. Dr. Darnelle Catalan was concerned that the wound opening was getting larger. I was planning on seeing her wound again anyway, but we advanced her appointment.    Review of Systems  All other systems reviewed and are negative.       Objective:   Physical Exam  Constitutional: She is oriented to person, place, and time. She appears well-developed and well-nourished. She is much calmer.  Head: Normocephalic and atraumatic.  Eyes: Conjunctivae are normal. Pupils are equal, round, and reactive to light. No scleral icterus.  Breast Wound with decreased cavity size.  Still with some seroma drainage.  Skin stuck down to cavity.  Erythema resolved.   Musculoskeletal: Normal range of motion.  Neurological: She is alert and oriented to person, place, and time.  Psychiatric: She has a normal mood and affect. Her behavior is normal. Judgment and thought content normal.       Assessment/Plan:     Seroma The patient's wound does appear to be sticking down to the chest wall. I'm hopeful that this means that it will continue to heal without leaving a cavity.  The infection appears to be resolved. The tenderness is improved significantly. I advised her to shower with the dressings off daily.  I still think she would have significant difficulty packing this wound. I will see her back in 2 weeks.

## 2013-04-27 ENCOUNTER — Other Ambulatory Visit: Payer: Self-pay | Admitting: Physician Assistant

## 2013-04-27 DIAGNOSIS — C50911 Malignant neoplasm of unspecified site of right female breast: Secondary | ICD-10-CM

## 2013-05-02 ENCOUNTER — Encounter (INDEPENDENT_AMBULATORY_CARE_PROVIDER_SITE_OTHER): Payer: Self-pay | Admitting: General Surgery

## 2013-05-02 ENCOUNTER — Ambulatory Visit (INDEPENDENT_AMBULATORY_CARE_PROVIDER_SITE_OTHER): Payer: 59 | Admitting: General Surgery

## 2013-05-02 VITALS — HR 91 | Temp 97.2°F | Resp 18 | Ht 65.0 in | Wt 168.8 lb

## 2013-05-02 DIAGNOSIS — IMO0002 Reserved for concepts with insufficient information to code with codable children: Secondary | ICD-10-CM

## 2013-05-02 NOTE — Progress Notes (Signed)
Subjective:     Patient ID: Christina Blackwell, female   DOB: [birth year], 47 y.o.   MRN: [id number]  HPI Pt is 47 yo F now 1 year s/p right BCT/ALND.  She developed a large seroma and significant mastitis spontaneously after doing yardwork. The seroma broke open and she had a large cavity left behind.  She is no longer having pain.  She is having minimal drainage.  She is not having fevers/ chills.  Her malaise has improved.     Review of Systems  All other systems reviewed and are negative.       Objective:   Physical Exam  Constitutional: She is oriented to person, place, and time. She appears well-developed and well-nourished. She is still a little squeamish.    Head: Normocephalic and atraumatic.  Eyes: Conjunctivae are normal. Pupils are equal, round, and reactive to light. No scleral icterus.  Breast Wound with decreased cavity size.  Still with some seroma drainage.  Skin flaps mostly stuck down to cavity.  Erythema resolved.  Packed with 1/4" nugauze. Musculoskeletal: Normal range of motion.  Neurological: She is alert and oriented to person, place, and time.  Psychiatric: She has a normal mood and affect. Her behavior is normal. Judgment and thought content normal.       Assessment/Plan:     Seroma Seroma cavity continues to decrease in size. I advised her that I think packing this once a day with Nu Gauze will help it heal faster.   It also will help the cavity completely close and not have the skin close over leaving a cavity behind.    We will refer her to plastic surgery to consideration of rearrangement of tissue at site of pucker.

## 2013-05-02 NOTE — Patient Instructions (Signed)
We will make referral to Dr. Odis Luster for consideration of reconstruction.   Follow up with me in 4 weeks.  Pack once daily with moist gauze strip. Remove dressing in shower.

## 2013-05-02 NOTE — Assessment & Plan Note (Signed)
Seroma cavity continues to decrease in size. I advised her that I think packing this once a day with Nu Gauze will help it heal faster.   It also will help the cavity completely close and not have the skin close over leaving a cavity behind.    We will refer her to plastic surgery to consideration of rearrangement of tissue at site of pucker.

## 2013-06-13 ENCOUNTER — Encounter (INDEPENDENT_AMBULATORY_CARE_PROVIDER_SITE_OTHER): Payer: 59 | Admitting: General Surgery

## 2013-06-24 ENCOUNTER — Encounter (INDEPENDENT_AMBULATORY_CARE_PROVIDER_SITE_OTHER): Payer: Self-pay | Admitting: General Surgery

## 2013-06-24 ENCOUNTER — Other Ambulatory Visit (INDEPENDENT_AMBULATORY_CARE_PROVIDER_SITE_OTHER): Payer: Self-pay | Admitting: General Surgery

## 2013-06-24 ENCOUNTER — Ambulatory Visit (INDEPENDENT_AMBULATORY_CARE_PROVIDER_SITE_OTHER): Payer: 59 | Admitting: General Surgery

## 2013-06-24 VITALS — BP 124/72 | HR 66 | Temp 97.4°F | Resp 15 | Ht 65.0 in | Wt 170.0 lb

## 2013-06-24 DIAGNOSIS — IMO0002 Reserved for concepts with insufficient information to code with codable children: Secondary | ICD-10-CM

## 2013-06-24 DIAGNOSIS — C50911 Malignant neoplasm of unspecified site of right female breast: Secondary | ICD-10-CM

## 2013-06-24 DIAGNOSIS — I890 Lymphedema, not elsewhere classified: Secondary | ICD-10-CM

## 2013-06-24 NOTE — Patient Instructions (Signed)
Continue packing for now.    Follow up with me in 6-8 weeks.

## 2013-06-24 NOTE — Progress Notes (Signed)
Subjective:     Patient ID: Christina Blackwell, female   DOB: [birth year], 47 y.o.   MRN: [id number]  HPI Pt is 47 yo F now around 16 months s/p right BCT/ALND.  She developed a large seroma and significant mastitis spontaneously after doing yardwork. The seroma broke open and she had a large cavity left behind.  Originally, she was unable to tolerate packing due to pain, but has been packing the seroma for the last 2 months.  It has significantly shrunk in size.  She is not having pain.    Review of Systems  All other systems reviewed and are negative.      Objective:   Physical Exam  Constitutional: She is oriented to person, place, and time. She appears well-developed and well-nourished. She is still a little squeamish.    Head: Normocephalic and atraumatic.  Eyes: Conjunctivae are normal. Pupils are equal, round, and reactive to light. No scleral icterus.  Breast:   No drainage. Skin flaps mostly stuck down to cavity.  Minimal cavity.  Packed with 1/4" nugauze. Musculoskeletal: Normal range of motion.  Neurological: She is alert and oriented to person, place, and time.  Psychiatric: She has a normal mood and affect. Her behavior is normal. Judgment and thought content normal.       Assessment/Plan:     Seroma Seroma nearly healed.   Minimal packing can be inserted. Significant dimpling at superior aspect of nipple.    Refer to plastic surgery.

## 2013-06-24 NOTE — Assessment & Plan Note (Signed)
Seroma nearly healed.   Minimal packing can be inserted. Significant dimpling at superior aspect of nipple.    Refer to plastic surgery.

## 2013-07-05 ENCOUNTER — Other Ambulatory Visit (INDEPENDENT_AMBULATORY_CARE_PROVIDER_SITE_OTHER): Payer: Self-pay | Admitting: General Surgery

## 2013-07-05 ENCOUNTER — Other Ambulatory Visit (HOSPITAL_BASED_OUTPATIENT_CLINIC_OR_DEPARTMENT_OTHER): Payer: 59 | Admitting: Lab

## 2013-07-05 DIAGNOSIS — C50919 Malignant neoplasm of unspecified site of unspecified female breast: Secondary | ICD-10-CM

## 2013-07-05 DIAGNOSIS — C50911 Malignant neoplasm of unspecified site of right female breast: Secondary | ICD-10-CM

## 2013-07-05 LAB — COMPREHENSIVE METABOLIC PANEL (CC13)
ALT: 19 U/L (ref 0–55)
AST: 18 U/L (ref 5–34)
Albumin: 3.7 g/dL (ref 3.5–5.0)
Alkaline Phosphatase: 71 U/L (ref 40–150)
BUN: 15.5 mg/dL (ref 7.0–26.0)
CO2: 22 meq/L (ref 22–29)
Calcium: 9.5 mg/dL (ref 8.4–10.4)
Chloride: 108 meq/L (ref 98–109)
Creatinine: 0.9 mg/dL (ref 0.6–1.1)
Glucose: 101 mg/dL (ref 70–140)
Potassium: 3.9 meq/L (ref 3.5–5.1)
Sodium: 142 meq/L (ref 136–145)
Total Bilirubin: 0.3 mg/dL (ref 0.20–1.20)
Total Protein: 7.1 g/dL (ref 6.4–8.3)

## 2013-07-05 LAB — CBC WITH DIFFERENTIAL/PLATELET
Basophils Absolute: 0 10*3/uL (ref 0.0–0.1)
Eosinophils Absolute: 0.2 10*3/uL (ref 0.0–0.5)
HCT: 42.3 % (ref 34.8–46.6)
HGB: 14.3 g/dL (ref 11.6–15.9)
MCV: 90.3 fL (ref 79.5–101.0)
MONO%: 8.6 % (ref 0.0–14.0)
NEUT#: 4 10*3/uL (ref 1.5–6.5)
NEUT%: 62.8 % (ref 38.4–76.8)
Platelets: 185 10*3/uL (ref 145–400)
RDW: 13.4 % (ref 11.2–14.5)

## 2013-07-06 ENCOUNTER — Encounter (INDEPENDENT_AMBULATORY_CARE_PROVIDER_SITE_OTHER): Payer: Self-pay | Admitting: General Surgery

## 2013-07-06 ENCOUNTER — Telehealth (INDEPENDENT_AMBULATORY_CARE_PROVIDER_SITE_OTHER): Payer: Self-pay | Admitting: *Deleted

## 2013-07-06 NOTE — Telephone Encounter (Signed)
Received a request from the pharmacy for a refill of patients Tramadol.  Called to speak with patient who states she does still have pain in her right breast at the wound site with packing and also "sometimes it gets pulled on".  Patient states she only takes it from time to time PRN.  Spoke to Dr. Andrey Campanile since Dr. Donell Beers is unavailable who approved 1 refill of Tramadol at this time.  Prescription approved in Epic and escribed back to the pharmacy.  Patient updated at this time.

## 2013-07-07 ENCOUNTER — Telehealth (INDEPENDENT_AMBULATORY_CARE_PROVIDER_SITE_OTHER): Payer: Self-pay

## 2013-07-07 NOTE — Telephone Encounter (Signed)
Patient called in stating pharmacy did not receive tramadol refill yesterday. I called it into her pharmacy for her.

## 2013-07-09 ENCOUNTER — Other Ambulatory Visit: Payer: Self-pay | Admitting: Oncology

## 2013-07-09 DIAGNOSIS — C50911 Malignant neoplasm of unspecified site of right female breast: Secondary | ICD-10-CM

## 2013-07-12 ENCOUNTER — Encounter: Payer: Self-pay | Admitting: Physician Assistant

## 2013-07-12 ENCOUNTER — Telehealth: Payer: Self-pay | Admitting: Oncology

## 2013-07-12 ENCOUNTER — Ambulatory Visit (HOSPITAL_BASED_OUTPATIENT_CLINIC_OR_DEPARTMENT_OTHER): Payer: 59 | Admitting: Physician Assistant

## 2013-07-12 VITALS — BP 113/78 | HR 89 | Temp 97.1°F | Resp 18 | Ht 65.0 in | Wt 169.4 lb

## 2013-07-12 DIAGNOSIS — C50919 Malignant neoplasm of unspecified site of unspecified female breast: Secondary | ICD-10-CM

## 2013-07-12 DIAGNOSIS — C50911 Malignant neoplasm of unspecified site of right female breast: Secondary | ICD-10-CM

## 2013-07-12 DIAGNOSIS — I890 Lymphedema, not elsewhere classified: Secondary | ICD-10-CM

## 2013-07-12 DIAGNOSIS — IMO0002 Reserved for concepts with insufficient information to code with codable children: Secondary | ICD-10-CM

## 2013-07-12 DIAGNOSIS — R232 Flushing: Secondary | ICD-10-CM | POA: Insufficient documentation

## 2013-07-12 DIAGNOSIS — Z853 Personal history of malignant neoplasm of breast: Secondary | ICD-10-CM

## 2013-07-12 DIAGNOSIS — Z5189 Encounter for other specified aftercare: Secondary | ICD-10-CM

## 2013-07-12 DIAGNOSIS — T451X5A Adverse effect of antineoplastic and immunosuppressive drugs, initial encounter: Secondary | ICD-10-CM | POA: Insufficient documentation

## 2013-07-12 DIAGNOSIS — F419 Anxiety disorder, unspecified: Secondary | ICD-10-CM

## 2013-07-12 DIAGNOSIS — F411 Generalized anxiety disorder: Secondary | ICD-10-CM

## 2013-07-12 MED ORDER — TAMOXIFEN CITRATE 20 MG PO TABS: 20.0000 mg | ORAL_TABLET | Freq: Every day | ORAL | Status: DC

## 2013-07-12 MED ORDER — CLONIDINE HCL 0.1 MG PO TABS: 0.1000 mg | ORAL_TABLET | Freq: Every day | ORAL | Status: DC

## 2013-07-12 MED ORDER — GABAPENTIN 300 MG PO CAPS: 300.0000 mg | ORAL_CAPSULE | Freq: Every day | ORAL | Status: DC

## 2013-07-12 MED ORDER — LORAZEPAM 0.5 MG PO TABS: 0.5000 mg | ORAL_TABLET | Freq: Two times a day (BID) | ORAL | Status: DC | PRN

## 2013-07-12 NOTE — Progress Notes (Signed)
ID: Christina Blackwell   DOB: 03/06/1966  MR#: [id number]  GMW#:[id number]  HISTORY OF PRESENT ILLNESS: Christina Blackwell is a 47 year old [place] woman formerly followed here for a left-sided stage I invasive ductal carcinoma, which was treated with adjuvant chemotherapy completed in 2003. She was released from followup last year.  On 12/28/2011 she had bilateral screening mammography showing a possible area of distortion in the right breast. She was recalled for additional views 01/09/2012 and this confirmed an area of architectural distortion in the lateral aspect of the right breast measuring 2.9 cm. This was not palpable by exam. Biopsy was performed the same day and showed ([id number]) and invasive lobular breast cancer, e-cadherin negative, strongly estrogen receptor positive at 85%, progesterone receptor positive at 98%, with an MIB-1 of 9%, and no HER-2 amplification.  Patient is status post right lumpectomy and x-ray lymph node dissection 03/02/2012 for a T2 N2 invasive lobular breast carcinoma, grade 1. 10 of 10 lymph nodes were positive. Margins were close but negative. Her subsequent history is as detailed below  INTERVAL HISTORY: Christina Blackwell returns today for followup of her right breast carcinoma. She continues to see Dr. Donell Beers regularly for a right breast seroma, and is still packing the wound, which appears to be healing very well. She is also scheduled to meet with Dr. Odis Luster next month to discuss possible plastic surgery to repair this abnormality which is also causing changes in the right areolar complex.   Otherwise, interval history is unremarkable. Christina Blackwell continues to work in [place], and their office is moved even further from her current home. She continues on tamoxifen with good tolerance, the exception being severe hot flashes for which she continues on both clonidine and gabapentin daily. She denies, however, any additional side effects, including vaginal dryness, discharge, or bleeding;  abnormal clotting; or change in vision.  REVIEW OF SYSTEMS: Christina Blackwell denies any recent illnesses and has had no fevers or chills. She denies any skin changes or rashes and has had no abnormal bruising or bleeding. She's eating well, with no nausea or change in bowel habits. She's tends to be slightly constipated and takes bowel softeners affectively. She's had no new cough, shortness of breath, or chest pain. She denies any abnormal headaches or dizziness. Fortunately, the right breast is not painful. She does continue to have lymphedema in the right upper chest remedies which causes her discomfort at times, and she is wearing her sleeve daily. She's had no additional myalgias, arthralgias, or bony pain. She continues to have anxiety, especially with regards to her breast cancer diagnosis.  A detailed review of systems is otherwise noncontributory.   PAST MEDICAL HISTORY: Past Medical History  Diagnosis Date  . History of bilateral breast implants   . GERD (gastroesophageal reflux disease)   . Complication of anesthesia   . PONV (postoperative nausea and vomiting)   . History of radiation therapy 03/30/02-05/16/02    left breast   . History of chemotherapy     cyclosphosphamide/ doxorubicin  . Breast cancer 2002    left   . Breast cancer 2012    right  . Anxiety   . Depression   . History of chemotherapy 4th:June 13,2013    taxol  . History of radiation therapy 06/22/12 -08/06/12    right breast  . Seroma 12/2012 u/s xray    right breast/aspiration 01/19/13    PAST SURGICAL HISTORY: Past Surgical History  Procedure Laterality Date  . Portacath placement    . Portacath removal    .  Tonsillectomy and adenoidectomy    . Breast lumpectomy  11/2001    left  . Sentinel lymph node biopsy  11/2001    left axillary  . Breast enhancement surgery  2003    bilateral  . Breast lumpectomy w/ needle localization  02/05/2012    right/ slnbx  . Mirena iud      still in place as of 06/09/12  .  Axillary lymph node dissection  03/02/2012    Procedure: AXILLARY LYMPH NODE DISSECTION;  Surgeon: Kandis Cocking, MD;  Location: [facility];  Service: General;  Laterality: Right;    FAMILY HISTORY Family History  Problem Relation Age of Onset  . Cancer Mother     breast  . Cancer Paternal Aunt     breast  . Cancer Cousin     breast ca 33's 1st cousin    Gynecologic history:  Menarche age 27, she is GX P0. She is currently using a Mirena implant  and is considering bilateral salpingo-oophorectomy.  Social history:  She works for [employer], but her full-time job was eliminated, and she is temporarily employed by them at present. She is divorced and lives by herself, with 2 birds, an [place] parrot and a cockateal. 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   ADVANCED DIRECTIVES: in place   HEALTH MAINTENANCE: History  Substance Use Topics  . Smoking status: Never Smoker   . Smokeless tobacco: Never Used  . Alcohol Use: Yes     Comment: social occasio0nally 1-2      Colonoscopy:  PAP:  Bone density:  Lipid panel:  No Known Allergies  Current Outpatient Prescriptions  Medication Sig Dispense Refill  . cetirizine (ZYRTEC) 10 MG tablet Take 10 mg by mouth as needed.      . cloNIDine (CATAPRES) 0.1 MG tablet Take 1 tablet (0.1 mg total) by mouth daily.  30 tablet  5  . gabapentin (NEURONTIN) 300 MG capsule Take 1 capsule (300 mg total) by mouth at bedtime.  30 capsule  5  . ibuprofen (ADVIL,MOTRIN) 200 MG tablet Take 200 mg by mouth every 6 (six) hours as needed.      Marland Kitchen LORazepam (ATIVAN) 0.5 MG tablet Take 1 tablet (0.5 mg total) by mouth 2 (two) times daily as needed for anxiety.  45 tablet  0  . Multiple Vitamin (MULTIVITAMIN) tablet Take 1 tablet by mouth daily.      Marland Kitchen omeprazole (PRILOSEC) 20 MG capsule Take 20 mg by mouth daily.      . tamoxifen (NOLVADEX) 20 MG tablet Take 1 tablet (20 mg total) by mouth daily.  30 tablet  5  . traMADol (ULTRAM) 50 MG  tablet TAKE 1 - 2 TABLETS (50-100 MG TOTAL) BY MOUTH EVERY 6 (SIX) HOURS AS NEEDED FOR PAIN.  30 tablet  0   No current facility-administered medications for this visit.   Facility-Administered Medications Ordered in Other Visits  Medication Dose Route Frequency Provider Last Rate Last Dose  . topical emolient (BIAFINE) emulsion   Topical Daily Oneita Hurt, MD        OBJECTIVE: Middle-aged white woman who appears anxious Filed Vitals:   07/12/13 1451  BP: 113/78  Pulse: 89  Temp: 97.1 F (36.2 C)  Resp: 18     Body mass index is 28.19 kg/(m^2).    ECOG FS: 1  Filed Weights   07/12/13 1451  Weight: 169 lb 6.4 oz (76.839 kg)   Sclerae unicteric Oropharynx clear No cervical  or supraclavicular adenopathy Lungs clear to auscultation bilaterally, no rales or rhonchi Heart regular rate and rhythm, no murmur appreciated Abdomen soft, nontender to palpation, positive bowel sounds MSK no focal spinal tenderness, no peripheral edema Neuro: nonfocal, well oriented, anxious affect Breasts: The right breast is status post right lumpectomy and radiation. There is an oval wound measuring approximately 0.5 cm in diameter, packed, clean with no drainage, erythema, or evidence of infection. This is causing dimpling and "puckering" of the right nipple and areolar complex. Otherwise, right breast is unremarkable with no evidence of local recurrence. Left breast is also status post lumpectomy with no evidence of local recurrence. Axillae are benign bilaterally with no palpable adenopathy palpated.    LAB RESULTS: Lab Results  Component Value Date   WBC 6.3 07/05/2013   NEUTROABS 4.0 07/05/2013   HGB 14.3 07/05/2013   HCT 42.3 07/05/2013   MCV 90.3 07/05/2013   PLT 185 07/05/2013      Chemistry      Component Value Date/Time   NA 142 07/05/2013 0810   NA 140 06/30/2012 0841   K 3.9 07/05/2013 0810   K 4.0 06/30/2012 0841   CL 105 04/05/2013 1157   CL 108 06/30/2012 0841   CO2 22 07/05/2013  0810   CO2 25 06/30/2012 0841   BUN 15.5 07/05/2013 0810   BUN 11 06/30/2012 0841   CREATININE 0.9 07/05/2013 0810   CREATININE 0.75 06/30/2012 0841      Component Value Date/Time   CALCIUM 9.5 07/05/2013 0810   CALCIUM 9.3 06/30/2012 0841   ALKPHOS 71 07/05/2013 0810   ALKPHOS 47 06/30/2012 0841   AST 18 07/05/2013 0810   AST 16 06/30/2012 0841   ALT 19 07/05/2013 0810   ALT 14 06/30/2012 0841   BILITOT 0.30 07/05/2013 0810   BILITOT 0.5 06/30/2012 0841       Lab Results  Component Value Date   LABCA2 18 01/10/2013     STUDIES:   Mm Digital Diagnostic Bilat  01/03/2013  *RADIOLOGY REPORT*  Clinical Data:  The patient underwent left lumpectomy, chemotherapy and radiation therapy for breast cancer in 2002/2003. She underwent right lumpectomy, axillary dissection and radiation therapy for breast cancer in 2013.  DIGITAL DIAGNOSTIC BILATERAL MAMMOGRAM WITH CAD AND RIGHT BREAST ULTRASOUND:  Comparison:  12/31/2011, 12/10/2010, 12/06/2009  Findings:  ACR Breast Density Category 1: The breast tissue is almost entirely fatty.  There are subpectoral saline implants bilaterally.  Lumpectomy changes are noted bilaterally.  There is no suspicious dominant mass, nonsurgical architectural distortion or calcification to suggest malignancy on the left.  There is a large mass with smooth borders in the lumpectomy site on the right which would be consistent with a large seroma.  No other abnormality is noted on the right.  Mammographic images were processed with CAD.  On physical exam, there is a firm mass between 9 and 10 o'clock on the right. There is no sign of infection.  Ultrasound is performed, showing a seroma at 9-10 o'clock 5 cm from the right nipple measuring 5.8 x 3.7 x 5.6 cm.  IMPRESSION: Large right seroma.  No mammographic or sonographic evidence malignancy.  RECOMMENDATION: Yearly diagnostic mammography is suggested.  I have discussed the findings and recommendations with the patient. Results were  also provided in writing at the conclusion of the visit.  BI-RADS CATEGORY 2:  Benign finding(s).   Original Report Authenticated By: Cain Saupe, M.D.       ASSESSMENT: 47 y.o.  BRCA  1-2 negative [place] woman  (1) status post left lumpectomy and sentinel lymph node biopsy December 2002 for a 1.9 cm invasive ductal carcinoma, no lymph node involvement, triple negative, treated with 4 cycles of Cytoxan and Adriamycin followed by radiation, with no evidence of disease recurrence to date.  (2) status post Right lumpectomy and axillary lymph node dissection 03/02/2012 for a T2 N2 invasive lobular breast cancer, grade 1, estrogen receptor 85% and progesterone receptor 98% positive, with HER-2 nonamplified and an MIB-1 of 9%; 10 of 10 lymph nodes involved. (3)  completed 4 cycles of docetaxel/cyclophosphamide 05/27/2012 (4)  Status post radiation therapy,completed late August 2013 (5)  she started on tamoxifen August 2013 (6)  lymphedema, right upper extremity (7) seroma cavity Right breast   PLAN: Overall, Christina Blackwell is doing very well, and the right seroma does appear to be healing well. She scheduled to see Dr. Odis Luster for a plastic surgery consult on August 11. Of course she will continue to be followed by Dr. Rowan Blase as well.  Christina Blackwell will continue on the tamoxifen which I have refilled for her today. Of course her biggest problem is the hot flashes, and I'm also refilling the clonidine and gabapentin accordingly. She continues to have some anxiety about her diagnosis, and this causes additional insomnia. I am refilling her lorazepam to take 0.5 mg up to twice daily as needed.  Christina Blackwell will see me again in 3 months, late October. Just prior to that visit we will repeat labs, and we'll also plan to repeat a PET scan. Christina Blackwell understands, however, that if she still has the wound from the seroma, or if she has recently had surgery, we will need to delay the PET scan since inflammation might be  misleading in the scan results. If that is the case, she will call us in early October to let us know and we will reevaluate when I see her later that month.  All this was discussed in detail with the patient today who voices understanding and agreement with our plan. She knows to call with any changes prior to her next scheduled appointment.   Christina Blackwell    07/12/2013

## 2013-08-22 ENCOUNTER — Encounter (INDEPENDENT_AMBULATORY_CARE_PROVIDER_SITE_OTHER): Payer: 59 | Admitting: General Surgery

## 2013-08-24 ENCOUNTER — Telehealth: Payer: Self-pay | Admitting: Oncology

## 2013-08-24 NOTE — Telephone Encounter (Signed)
Faxed pt medical records to Dr. Tessa Lerner (per pt)

## 2013-09-12 ENCOUNTER — Encounter (INDEPENDENT_AMBULATORY_CARE_PROVIDER_SITE_OTHER): Payer: 59 | Admitting: General Surgery

## 2013-09-21 ENCOUNTER — Other Ambulatory Visit (HOSPITAL_COMMUNITY): Payer: 59

## 2013-09-29 ENCOUNTER — Inpatient Hospital Stay: Admit: 2013-09-29 | Payer: 59 | Admitting: Plastic Surgery

## 2013-09-29 SURGERY — TRANSVERSE RECTUS ABDOMINIS MYOCUTANEOUS (TRAM)
Anesthesia: General | Site: Breast | Laterality: Right

## 2013-10-03 ENCOUNTER — Other Ambulatory Visit: Payer: Self-pay | Admitting: Physician Assistant

## 2013-10-03 ENCOUNTER — Encounter (HOSPITAL_COMMUNITY): Payer: 59

## 2013-10-03 ENCOUNTER — Other Ambulatory Visit: Payer: 59 | Admitting: Lab

## 2013-10-03 NOTE — Progress Notes (Signed)
Spoke with Dr. Domingo Cocking at [facility] to pre-cert patient for PET.  [phone], exp. 11/17/2013  Zollie Scale, PA-C 10/03/2013

## 2013-10-05 ENCOUNTER — Encounter (HOSPITAL_COMMUNITY)
Admission: RE | Admit: 2013-10-05 | Discharge: 2013-10-05 | Disposition: A | Discharge status: Home / Self Care | Payer: 59 | Source: Ambulatory Visit | Attending: Physician Assistant | Admitting: Physician Assistant

## 2013-10-05 DIAGNOSIS — Z853 Personal history of malignant neoplasm of breast: Secondary | ICD-10-CM

## 2013-10-05 DIAGNOSIS — C50919 Malignant neoplasm of unspecified site of unspecified female breast: Secondary | ICD-10-CM | POA: Insufficient documentation

## 2013-10-05 DIAGNOSIS — C50911 Malignant neoplasm of unspecified site of right female breast: Secondary | ICD-10-CM

## 2013-10-05 LAB — GLUCOSE, CAPILLARY: Glucose-Capillary: 97 mg/dL (ref 70–99)

## 2013-10-05 MED ORDER — FLUDEOXYGLUCOSE F - 18 (FDG) INJECTION
18.5000 | Freq: Once | INTRAVENOUS | Status: AC | PRN
  Administered 2013-10-05: 18.5 via INTRAVENOUS

## 2013-10-10 ENCOUNTER — Encounter: Payer: Self-pay | Admitting: Physician Assistant

## 2013-10-10 ENCOUNTER — Ambulatory Visit (HOSPITAL_BASED_OUTPATIENT_CLINIC_OR_DEPARTMENT_OTHER): Payer: 59 | Admitting: Lab

## 2013-10-10 ENCOUNTER — Ambulatory Visit (HOSPITAL_BASED_OUTPATIENT_CLINIC_OR_DEPARTMENT_OTHER): Payer: 59 | Admitting: Physician Assistant

## 2013-10-10 ENCOUNTER — Telehealth: Payer: Self-pay | Admitting: *Deleted

## 2013-10-10 VITALS — BP 100/67 | HR 72 | Temp 97.8°F | Resp 18 | Ht 65.0 in | Wt 165.2 lb

## 2013-10-10 DIAGNOSIS — I890 Lymphedema, not elsewhere classified: Secondary | ICD-10-CM

## 2013-10-10 DIAGNOSIS — Z23 Encounter for immunization: Secondary | ICD-10-CM

## 2013-10-10 DIAGNOSIS — Z853 Personal history of malignant neoplasm of breast: Secondary | ICD-10-CM

## 2013-10-10 DIAGNOSIS — F419 Anxiety disorder, unspecified: Secondary | ICD-10-CM

## 2013-10-10 DIAGNOSIS — C50919 Malignant neoplasm of unspecified site of unspecified female breast: Secondary | ICD-10-CM

## 2013-10-10 DIAGNOSIS — C50911 Malignant neoplasm of unspecified site of right female breast: Secondary | ICD-10-CM

## 2013-10-10 DIAGNOSIS — Z170 Estrogen receptor positive status [ER+]: Secondary | ICD-10-CM

## 2013-10-10 DIAGNOSIS — C773 Secondary and unspecified malignant neoplasm of axilla and upper limb lymph nodes: Secondary | ICD-10-CM

## 2013-10-10 DIAGNOSIS — IMO0002 Reserved for concepts with insufficient information to code with codable children: Secondary | ICD-10-CM

## 2013-10-10 LAB — CBC WITH DIFFERENTIAL/PLATELET
BASO%: 0.4 % (ref 0.0–2.0)
Basophils Absolute: 0 10*3/uL (ref 0.0–0.1)
EOS%: 1.8 % (ref 0.0–7.0)
HCT: 41.4 % (ref 34.8–46.6)
LYMPH%: 24.3 % (ref 14.0–49.7)
MCH: 30.5 pg (ref 25.1–34.0)
MCHC: 33.3 g/dL (ref 31.5–36.0)
MCV: 91.5 fL (ref 79.5–101.0)
MONO#: 0.5 10*3/uL (ref 0.1–0.9)
NEUT%: 65.3 % (ref 38.4–76.8)
Platelets: 196 10*3/uL (ref 145–400)
lymph#: 1.6 10*3/uL (ref 0.9–3.3)

## 2013-10-10 LAB — COMPREHENSIVE METABOLIC PANEL (CC13)
ALT: 20 U/L (ref 0–55)
AST: 18 U/L (ref 5–34)
Anion Gap: 10 mEq/L (ref 3–11)
CO2: 25 mEq/L (ref 22–29)
Creatinine: 0.8 mg/dL (ref 0.6–1.1)
Total Bilirubin: 0.28 mg/dL (ref 0.20–1.20)

## 2013-10-10 MED ORDER — INFLUENZA VAC SPLIT QUAD 0.5 ML IM SUSP
0.5000 mL | Freq: Once | INTRAMUSCULAR | Status: AC
  Administered 2013-10-10: 0.5 mL via INTRAMUSCULAR
  Filled 2013-10-10: qty 0.5

## 2013-10-10 MED ORDER — TAMOXIFEN CITRATE 20 MG PO TABS: 20.0000 mg | ORAL_TABLET | Freq: Every day | ORAL | Status: DC

## 2013-10-10 NOTE — Telephone Encounter (Signed)
appts made and printed...td 

## 2013-10-10 NOTE — Progress Notes (Signed)
ID: [place]   DOB: 1966/12/10  MR#: [id number]  CSN#:[id number]   PCP: Provider Not In System GYN:   SUR:  Almond Lint, MD RADONC:  Margaretmary Dys, MD OTHER:  Etter Sjogren, MD;   Celene Kras, MD (Plastic Surgery, [place]; [place] [phone]) Laren Everts, MD (Onc Surgery, [place], [place] # [phone]) Jackelyn Knife, MD ([facility])   HISTORY OF PRESENT ILLNESS: Christina [place] is a 47 year old [place] woman formerly followed here for a left-sided stage I invasive ductal carcinoma, which was treated with adjuvant chemotherapy completed in 2003. She was released from followup last year.  On 12/28/2011 she had bilateral screening mammography showing a possible area of distortion in the right breast. She was recalled for additional views 01/09/2012 and this confirmed an area of architectural distortion in the lateral aspect of the right breast measuring 2.9 cm. This was not palpable by exam. Biopsy was performed the same day and showed ([id number]) and invasive lobular breast cancer, e-cadherin negative, strongly estrogen receptor positive at 85%, progesterone receptor positive at 98%, with an MIB-1 of 9%, and no HER-2 amplification.  Patient is status post right lumpectomy and x-ray lymph node dissection 03/02/2012 for a T2 N2 invasive lobular breast carcinoma, grade 1. 10 of 10 lymph nodes were positive. Margins were close but negative. Her subsequent history is as detailed below  INTERVAL HISTORY: [place] returns today for followup of her right breast carcinoma. And remote history of left breast carcinoma. The seroma in her right breast finally cleared. She recently had a PET scan which showed no evidence of disease recurrence, and the only abnormalities were noted in the right breast, the area of the previous seroma.   Christina [place] continues on tamoxifen which she is tolerating very well. She does have some occasional hot flashes. She's had no vaginal discharge, dryness, or abnormal vaginal bleeding. She  denies any peripheral swelling, and has had no abnormal clotting or significant change in vision.  Interval history is notable for the fact that Christina [place] has been evaluated at [facility]. She initially went to discuss reconstruction with Dr. Tessa Lerner.  She has been given the opportunity to participate in a study regarding lymph node transplant under the care of Dr. Louanne Skye at [facility]. Accordingly, she is planning to have a right mastectomy under the care of Dr. Hollace Kinnier, lymph node transplant under the care of Dr. Louanne Skye, and TRAM reconstruction under the care of Dr. Tessa Lerner.  She's having surgeries will be in [place] November. They're thinking that the lymph node transplant will eventually decrease the lymphedema she is having in her right arm, possibly even allowing her to stop wearing a compression sleeve on a daily basis.   Interval history is otherwise unremarkable, and Christina [place] has no new complaints. She continues to work in [place].   REVIEW OF SYSTEMS: Christina [place] denies any recent illnesses and has had no fevers or chills. She denies any skin changes or rashes and has had no abnormal bruising or bleeding. She's eating well, with no nausea or change in bowel habits. She's had no new cough, shortness of breath, palpitations or chest pain. She denies any abnormal headaches or dizziness.  She's had no  new or unusual myalgias, arthralgias, or bony pain. She continues to have anxiety, especially with regards to her breast cancer diagnosis, but denies any depression.  A detailed review of systems is otherwise noncontributory.   PAST MEDICAL HISTORY: Past Medical History  Diagnosis Date  . History of bilateral breast implants   . GERD (gastroesophageal reflux disease)   .  Complication of anesthesia   . PONV (postoperative nausea and vomiting)   . History of radiation therapy 03/30/02-05/16/02    left breast   . History of chemotherapy     cyclosphosphamide/ doxorubicin  . Breast cancer 2002    left    . Breast cancer 2012    right  . Anxiety   . Depression   . History of chemotherapy 4th:June 13,2013    taxol  . History of radiation therapy 06/22/12 -08/06/12    right breast  . Seroma 12/2012 u/s xray    right breast/aspiration 01/19/13    PAST SURGICAL HISTORY: Past Surgical History  Procedure Laterality Date  . Portacath placement    . Portacath removal    . Tonsillectomy and adenoidectomy    . Breast lumpectomy  11/2001    left  . Sentinel lymph node biopsy  11/2001    left axillary  . Breast enhancement surgery  2003    bilateral  . Breast lumpectomy w/ needle localization  02/05/2012    right/ slnbx  . Mirena iud      still in place as of 06/09/12  . Axillary lymph node dissection  03/02/2012    Procedure: AXILLARY LYMPH NODE DISSECTION;  Surgeon: Kandis Cocking, MD;  Location: [facility];  Service: General;  Laterality: Right;    FAMILY HISTORY Family History  Problem Relation Age of Onset  . Cancer Mother     breast  . Cancer Paternal Aunt     breast  . Cancer Cousin     breast ca 68's 1st cousin    Gynecologic history:  Menarche age 39, she is GX P0. She is currently using a Mirena implant  and is considering bilateral salpingo-oophorectomy.  Social history:  (updated 10/10/2013)  She works for [employer] in [place], but her full-time job was eliminated, and she is temporarily employed by them at present. She is divorced and lives by herself, with 2 birds, an [place] parrot and a cockateal. 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   ADVANCED DIRECTIVES: in place   HEALTH MAINTENANCE:  (updated 10/10/2013) History  Substance Use Topics  . Smoking status: Never Smoker   . Smokeless tobacco: Never Used  . Alcohol Use: Yes     Comment: social occasio0nally 1-2      Colonoscopy: Never  PAP: Not on file  Bone density: Never  Lipid panel: Not on file    No Known Allergies  Current Outpatient Prescriptions  Medication Sig Dispense  Refill  . cetirizine (ZYRTEC) 10 MG tablet Take 10 mg by mouth as needed.      . cloNIDine (CATAPRES) 0.1 MG tablet Take 1 tablet (0.1 mg total) by mouth daily.  30 tablet  5  . gabapentin (NEURONTIN) 300 MG capsule Take 1 capsule (300 mg total) by mouth at bedtime.  30 capsule  5  . ibuprofen (ADVIL,MOTRIN) 200 MG tablet Take 200 mg by mouth every 6 (six) hours as needed.      Marland Kitchen LORazepam (ATIVAN) 0.5 MG tablet Take 1 tablet (0.5 mg total) by mouth 2 (two) times daily as needed for anxiety.  45 tablet  0  . Multiple Vitamin (MULTIVITAMIN) tablet Take 1 tablet by mouth daily.      Marland Kitchen omeprazole (PRILOSEC) 20 MG capsule Take 20 mg by mouth daily.      . tamoxifen (NOLVADEX) 20 MG tablet Take 1 tablet (20 mg total) by mouth daily.  90 tablet  3  No current facility-administered medications for this visit.   Facility-Administered Medications Ordered in Other Visits  Medication Dose Route Frequency Provider Last Rate Last Dose  . topical emolient (BIAFINE) emulsion   Topical Daily Oneita Hurt, MD        OBJECTIVE: Middle-aged white woman who appears anxious but is in no acute distress  Filed Vitals:   10/10/13 0907  BP: 100/67  Pulse: 72  Temp: 97.8 F (36.6 C)  Resp: 18     Body mass index is 27.49 kg/(m^2).    ECOG FS: 1  Filed Weights   10/10/13 0907  Weight: 165 lb 3.2 oz (74.934 kg)   Physical Exam: HEENT:  Sclerae anicteric.  Oropharynx clear. Good dentition NODES:  No cervical or supraclavicular lymphadenopathy palpated.  BREAST EXAM:  Right breast is status post lumpectomy and radiation. There is some distortion of the lateral edge of the breast status post previous seroma and associated wound. This has healed well. There is no evidence of local recurrence. Left breast is status post remote lumpectomy, with no evidence of local recurrence. Axillae are benign bilaterally, no palpable lymphadenopathy. LUNGS:  Clear to auscultation bilaterally.  No wheezes or rhonchi HEART:   Regular rate and rhythm. No murmur  ABDOMEN:  Soft, nontender.  Positive bowel sounds.  MSK:  No focal spinal tenderness to palpation.  Patient has full range of motion in the upper extremities. EXTREMITIES:  Lymphedema noted in the right upper extremity with compression sleeve in place. No additional peripheral edema noted. NEURO:  Nonfocal. Well oriented.  Positive but slightly anxious affect.    LAB RESULTS: Lab Results  Component Value Date   WBC 6.7 10/10/2013   NEUTROABS 4.4 10/10/2013   HGB 13.8 10/10/2013   HCT 41.4 10/10/2013   MCV 91.5 10/10/2013   PLT 196 10/10/2013      Chemistry      Component Value Date/Time   NA 143 10/10/2013 1024   NA 140 06/30/2012 0841   K 4.9 10/10/2013 1024   K 4.0 06/30/2012 0841   CL 105 04/05/2013 1157   CL 108 06/30/2012 0841   CO2 25 10/10/2013 1024   CO2 25 06/30/2012 0841   BUN 15.6 10/10/2013 1024   BUN 11 06/30/2012 0841   CREATININE 0.8 10/10/2013 1024   CREATININE 0.75 06/30/2012 0841      Component Value Date/Time   CALCIUM 9.8 10/10/2013 1024   CALCIUM 9.3 06/30/2012 0841   ALKPHOS 63 10/10/2013 1024   ALKPHOS 47 06/30/2012 0841   AST 18 10/10/2013 1024   AST 16 06/30/2012 0841   ALT 20 10/10/2013 1024   ALT 14 06/30/2012 0841   BILITOT 0.28 10/10/2013 1024   BILITOT 0.5 06/30/2012 0841       STUDIES:   Mm Digital Diagnostic Bilat  01/03/2013  *RADIOLOGY REPORT*  Clinical Data:  The patient underwent left lumpectomy, chemotherapy and radiation therapy for breast cancer in 2002/2003. She underwent right lumpectomy, axillary dissection and radiation therapy for breast cancer in 2013.  DIGITAL DIAGNOSTIC BILATERAL MAMMOGRAM WITH CAD AND RIGHT BREAST ULTRASOUND:  Comparison:  12/31/2011, 12/10/2010, 12/06/2009  Findings:  ACR Breast Density Category 1: The breast tissue is almost entirely fatty.  There are subpectoral saline implants bilaterally.  Lumpectomy changes are noted bilaterally.  There is no suspicious dominant mass,  nonsurgical architectural distortion or calcification to suggest malignancy on the left.  There is a large mass with smooth borders in the lumpectomy site on the right which would  be consistent with a large seroma.  No other abnormality is noted on the right.  Mammographic images were processed with CAD.  On physical exam, there is a firm mass between 9 and 10 o'clock on the right. There is no sign of infection.  Ultrasound is performed, showing a seroma at 9-10 o'clock 5 cm from the right nipple measuring 5.8 x 3.7 x 5.6 cm.  IMPRESSION: Large right seroma.  No mammographic or sonographic evidence malignancy.  RECOMMENDATION: Yearly diagnostic mammography is suggested.  I have discussed the findings and recommendations with the patient. Results were also provided in writing at the conclusion of the visit.  BI-RADS CATEGORY 2:  Benign finding(s).   Original Report Authenticated By: Cain Saupe, M.D.       ASSESSMENT: 47 y.o.  BRCA 1-2 negative [place] woman  (1) status post left lumpectomy and sentinel lymph node biopsy December 2002 for a 1.9 cm invasive ductal carcinoma, no lymph node involvement, triple negative, treated with 4 cycles of Cytoxan and Adriamycin followed by radiation, with no evidence of disease recurrence to date.  (2) status post Right lumpectomy and axillary lymph node dissection 03/02/2012 for a T2 N2 invasive lobular breast cancer, grade 1, estrogen receptor 85% and progesterone receptor 98% positive, with HER-2 nonamplified and an MIB-1 of 9%; 10 of 10 lymph nodes involved. (3)  completed 4 cycles of docetaxel/cyclophosphamide 05/27/2012 (4)  Status post radiation therapy,completed [place] August 2013 (5)  she started on tamoxifen August 2013 (6)  lymphedema, right upper extremity (7) seroma cavity Right breast, resolved   PLAN: Overall, Christina [place] is doing very well, and I am making no changes to her current regimen. I have refilled her tamoxifen for another year. She'll  continue to be followed at [facility] as discussed above, and we will fax them copies of today's office note as well.   I will plan on seeing Christina [place] back in [place] January. Assuming she has her right mastectomy as planned, she'll be due for a left screening mammogram in January, prior to her visit with me later in the month.  All this was discussed in detail with the patient today who voices understanding and agreement with our plan. She knows to call with any changes prior to her next scheduled appointment.   Christina Yust PA-C    10/10/2013

## 2013-11-02 ENCOUNTER — Telehealth: Payer: Self-pay | Admitting: *Deleted

## 2013-11-02 NOTE — Telephone Encounter (Signed)
Pt called to rs her lab for 11/02/13 to a later time. gv appt for labs on 11.19.14 @ 10:15am...td

## 2013-12-09 ENCOUNTER — Inpatient Hospital Stay: Admission: RE | Admit: 2013-12-09 | Payer: Self-pay | Source: Ambulatory Visit

## 2014-01-02 ENCOUNTER — Other Ambulatory Visit: Payer: 59 | Admitting: Lab

## 2014-01-02 ENCOUNTER — Other Ambulatory Visit (HOSPITAL_BASED_OUTPATIENT_CLINIC_OR_DEPARTMENT_OTHER): Payer: 59

## 2014-01-02 DIAGNOSIS — C50919 Malignant neoplasm of unspecified site of unspecified female breast: Secondary | ICD-10-CM

## 2014-01-02 DIAGNOSIS — C50911 Malignant neoplasm of unspecified site of right female breast: Secondary | ICD-10-CM

## 2014-01-02 DIAGNOSIS — Z853 Personal history of malignant neoplasm of breast: Secondary | ICD-10-CM

## 2014-01-02 LAB — COMPREHENSIVE METABOLIC PANEL (CC13)
ALBUMIN: 3.6 g/dL (ref 3.5–5.0)
ALK PHOS: 70 U/L (ref 40–150)
ALT: 16 U/L (ref 0–55)
AST: 16 U/L (ref 5–34)
Anion Gap: 11 mEq/L (ref 3–11)
BUN: 21.2 mg/dL (ref 7.0–26.0)
CO2: 23 mEq/L (ref 22–29)
Calcium: 9.2 mg/dL (ref 8.4–10.4)
Chloride: 108 mEq/L (ref 98–109)
Creatinine: 0.8 mg/dL (ref 0.6–1.1)
Glucose: 175 mg/dl — ABNORMAL HIGH (ref 70–140)
POTASSIUM: 4.4 meq/L (ref 3.5–5.1)
SODIUM: 141 meq/L (ref 136–145)
TOTAL PROTEIN: 6.8 g/dL (ref 6.4–8.3)
Total Bilirubin: 0.23 mg/dL (ref 0.20–1.20)

## 2014-01-02 LAB — CBC WITH DIFFERENTIAL/PLATELET
BASO%: 0.4 % (ref 0.0–2.0)
Basophils Absolute: 0 10*3/uL (ref 0.0–0.1)
EOS%: 6.3 % (ref 0.0–7.0)
Eosinophils Absolute: 0.3 10*3/uL (ref 0.0–0.5)
HCT: 42.5 % (ref 34.8–46.6)
HGB: 14.1 g/dL (ref 11.6–15.9)
LYMPH%: 25 % (ref 14.0–49.7)
MCH: 30.8 pg (ref 25.1–34.0)
MCHC: 33.1 g/dL (ref 31.5–36.0)
MCV: 93.1 fL (ref 79.5–101.0)
MONO#: 0.4 10*3/uL (ref 0.1–0.9)
MONO%: 7.3 % (ref 0.0–14.0)
NEUT%: 61 % (ref 38.4–76.8)
NEUTROS ABS: 3.3 10*3/uL (ref 1.5–6.5)
PLATELETS: 183 10*3/uL (ref 145–400)
RBC: 4.56 10*6/uL (ref 3.70–5.45)
RDW: 13.4 % (ref 11.2–14.5)
WBC: 5.4 10*3/uL (ref 3.9–10.3)
lymph#: 1.4 10*3/uL (ref 0.9–3.3)

## 2014-01-04 ENCOUNTER — Other Ambulatory Visit: Payer: Self-pay | Admitting: Physician Assistant

## 2014-01-04 ENCOUNTER — Ambulatory Visit
Admission: RE | Admit: 2014-01-04 | Discharge: 2014-01-04 | Disposition: A | Discharge status: Home / Self Care | Payer: 59 | Source: Ambulatory Visit | Attending: Physician Assistant | Admitting: Physician Assistant

## 2014-01-04 DIAGNOSIS — Z853 Personal history of malignant neoplasm of breast: Secondary | ICD-10-CM

## 2014-01-09 ENCOUNTER — Encounter: Payer: Self-pay | Admitting: Physician Assistant

## 2014-01-09 ENCOUNTER — Ambulatory Visit (HOSPITAL_BASED_OUTPATIENT_CLINIC_OR_DEPARTMENT_OTHER): Payer: 59

## 2014-01-09 ENCOUNTER — Telehealth: Payer: Self-pay | Admitting: *Deleted

## 2014-01-09 ENCOUNTER — Ambulatory Visit (HOSPITAL_BASED_OUTPATIENT_CLINIC_OR_DEPARTMENT_OTHER): Payer: 59 | Admitting: Physician Assistant

## 2014-01-09 VITALS — BP 118/78 | Temp 98.2°F | Ht 65.0 in | Wt 166.5 lb

## 2014-01-09 DIAGNOSIS — R7309 Other abnormal glucose: Secondary | ICD-10-CM

## 2014-01-09 DIAGNOSIS — I890 Lymphedema, not elsewhere classified: Secondary | ICD-10-CM

## 2014-01-09 DIAGNOSIS — R232 Flushing: Secondary | ICD-10-CM

## 2014-01-09 DIAGNOSIS — F411 Generalized anxiety disorder: Secondary | ICD-10-CM

## 2014-01-09 DIAGNOSIS — T451X5A Adverse effect of antineoplastic and immunosuppressive drugs, initial encounter: Secondary | ICD-10-CM

## 2014-01-09 DIAGNOSIS — Z853 Personal history of malignant neoplasm of breast: Secondary | ICD-10-CM

## 2014-01-09 DIAGNOSIS — C50919 Malignant neoplasm of unspecified site of unspecified female breast: Secondary | ICD-10-CM

## 2014-01-09 DIAGNOSIS — F419 Anxiety disorder, unspecified: Secondary | ICD-10-CM

## 2014-01-09 DIAGNOSIS — C50911 Malignant neoplasm of unspecified site of right female breast: Secondary | ICD-10-CM

## 2014-01-09 LAB — GLUCOSE (CC13): GLUCOSE: 103 mg/dL (ref 70–140)

## 2014-01-09 MED ORDER — LORAZEPAM 0.5 MG PO TABS: 0.5000 mg | ORAL_TABLET | Freq: Two times a day (BID) | ORAL | Status: DC | PRN

## 2014-01-09 NOTE — Progress Notes (Signed)
ID: Christina Blackwell   DOB: 02-Sep-1966  MR#: [id number]  GEX#:[id number]   PCP:  GYN:   SUR:  Stark Klein, MD RADONC:  Tyler Pita, MD OTHER:  Crissie Reese, MD;   Jacinto Reap, MD (Plastic Surgery, [place]; [place] [phone]) Josepha Pigg, MD ([facility], [place], [place] # [phone]) Melburn Hake, MD ([facility])  CHIEF COMPLAINT:  Hx of Bilateral Breast Cancers    HISTORY OF PRESENT ILLNESS: Christina Blackwell is an [place] woman formerly followed here for a left-sided stage I invasive ductal carcinoma, which was treated with adjuvant chemotherapy completed in 2003. She was released from followup 2012.   On 12/28/2011 she had bilateral screening mammography showing a possible area of distortion in the right breast. She was recalled for additional views 01/09/2012 and this confirmed an area of architectural distortion in the lateral aspect of the right breast measuring 2.9 cm. This was not palpable by exam. Biopsy was performed the same day and showed ([id number]) and invasive lobular breast cancer, e-cadherin negative, strongly estrogen receptor positive at 85%, progesterone receptor positive at 98%, with an MIB-1 of 9%, and no HER-2 amplification.  Patient is status post right lumpectomy and x-ray lymph node dissection 03/02/2012 for a T2 N2 invasive lobular breast carcinoma, grade 1. 10 of 10 lymph nodes were positive. Margins were close but negative.   Her subsequent history is as detailed below  INTERVAL HISTORY: [name] returns today for followup of her right breast carcinoma and remote history of left breast carcinoma.  She continues on tamoxifen which she tolerates well overall. She does continue to have hot flashes, despite being on both clonidine and gabapentin at night. (She does not tolerate venlafaxine) she also has some mild vaginal dryness, but denies any vaginal bleeding.   Interval history is notable for the fact that and she is participating in a study at [facility] which involves lymph  node transplant. She had her first surgery to the right arm on 12/09/2013 and has recovered well. She thinks she has noticed some slight improvement in her lymphedema. She is scheduled for her second surgery on February 11 which will include a right mastectomy, TRAM reconstruction, and the actual lymph node transplant. She's been told to be in the hospital for approximately 5 days after the procedure.  Otherwise, and she tells me there is "nothing new". 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   REVIEW OF SYSTEMS: Shawnae denies any recent illnesses and has had no fevers or chills. She denies rashes and has had no abnormal bruising or bleeding. Her energy level is fair. Her appetite is good, and she denies any nausea or recent change in bowel or bladder habits. She has had no new cough, increased shortness of breath, palpitations or chest pain. She has occasional headaches which she thinks may be sinus related. She describes this as "minor" and easily treated with Tylenol. She's had no change in vision denies any dizziness. She's had no peripheral swelling other than the right upper extremity lymphedema. She currently denies any unusual myalgias, arthralgias, or bony pain. She continues to have some problems with anxiety and takes lorazepam sparingly. She requests a refill today. (Her last refill was in July 2014.)  A detailed review of systems is otherwise stable and noncontributory.    PAST MEDICAL HISTORY: Past Medical History  Diagnosis Date  . History of bilateral breast implants   . GERD (gastroesophageal reflux disease)   . Complication of anesthesia   . PONV (postoperative nausea and vomiting)   .  History of radiation therapy 03/30/02-05/16/02    left breast   . History of chemotherapy     cyclosphosphamide/ doxorubicin  . Breast cancer 2002    left   . Breast cancer 2012    right  . Anxiety   . Depression   . History of chemotherapy 4th:June 13,2013    taxol   . History of radiation therapy 06/22/12 -08/06/12    right breast  . Seroma 12/2012 u/s xray    right breast/aspiration 01/19/13    PAST SURGICAL HISTORY: Past Surgical History  Procedure Laterality Date  . Portacath placement    . Portacath removal    . Tonsillectomy and adenoidectomy    . Breast lumpectomy  11/2001    left  . Sentinel lymph node biopsy  11/2001    left axillary  . Breast enhancement surgery  2003    bilateral  . Breast lumpectomy w/ needle localization  02/05/2012    right/ slnbx  . Mirena iud      still in place as of 06/09/12  . Axillary lymph node dissection  03/02/2012    Procedure: AXILLARY LYMPH NODE DISSECTION;  Surgeon: Shann Medal, MD;  Location: [facility];  Service: General;  Laterality: Right;    FAMILY HISTORY Family History  Problem Relation Age of Onset  . Cancer Mother     breast  . Cancer Paternal Aunt     breast  . Cancer Cousin     breast ca 42's 1st cousin    Gynecologic history:  Menarche age 29, she is GX P0. She is currently using a Mirena implant  and is considering bilateral salpingo-oophorectomy.  Social history:  (updated January 2015)  She works for [employer] in [place], but her full-time job was eliminated, and she is temporarily employed by them at present. She is divorced and lives by herself, with 2 birds, an [place] and a cockateal. She is not a Ambulance person.   ADVANCED DIRECTIVES: in place   HEALTH MAINTENANCE:  (updated 10/10/2013) History  Substance Use Topics  . Smoking status: Never Smoker   . Smokeless tobacco: Never Used  . Alcohol Use: Yes     Comment: social occasio0nally 1-2      Colonoscopy: Never  PAP: Not on file  Bone density: Never  Lipid panel: Not on file    Allergies  Allergen Reactions  . Rocuronium Anaphylaxis    Current Outpatient Prescriptions  Medication Sig Dispense Refill  . cetirizine (ZYRTEC) 10 MG tablet Take 10 mg by mouth as  needed.      . cloNIDine (CATAPRES) 0.1 MG tablet Take 1 tablet (0.1 mg total) by mouth daily.  30 tablet  5  . gabapentin (NEURONTIN) 300 MG capsule TAKE 1 CAPSULE (300 MG TOTAL) BY MOUTH AT BEDTIME.  30 capsule  5  . ibuprofen (ADVIL,MOTRIN) 200 MG tablet Take 200 mg by mouth every 6 (six) hours as needed.      Marland Kitchen LORazepam (ATIVAN) 0.5 MG tablet Take 1 tablet (0.5 mg total) by mouth 2 (two) times daily as needed for anxiety.  45 tablet  0  . Multiple Vitamin (MULTIVITAMIN) tablet Take 1 tablet by mouth daily.      Marland Kitchen omeprazole (PRILOSEC) 20 MG capsule Take 20 mg by mouth daily.      . tamoxifen (NOLVADEX) 20 MG tablet Take 1 tablet (20 mg total) by mouth daily.  90 tablet  3   No current facility-administered medications for  this visit.   Facility-Administered Medications Ordered in Other Visits  Medication Dose Route Frequency Provider Last Rate Last Dose  . topical emolient (BIAFINE) emulsion   Topical Daily Lora Paula, MD        OBJECTIVE: Middle-aged white woman who appears well and is in no acute distress  Filed Vitals:   01/09/14 0909  BP: 118/78  Temp:      Body mass index is 27.71 kg/(m^2).    ECOG FS: 0  Filed Weights   01/09/14 0901  Weight: 166 lb 8 oz (75.524 kg)   Physical Exam: HEENT:  Sclerae anicteric.  Oropharynx clear and moist. Good dentition. Next supple, trachea midline. No thyromegaly. NODES:  No cervical or supraclavicular lymphadenopathy palpated.  BREAST EXAM:  Right breast is status post lumpectomy and radiation. Distortion noted in the lateral edge of the breast status post previous seroma and associated wound.  There is no evidence of local recurrence. Left breast is status post remote lumpectomy, with no evidence of local recurrence. Axillae are benign bilaterally, no palpable lymphadenopathy. LUNGS:  Clear to auscultation bilaterally.  No wheezes or rhonchi HEART:  Regular rate and rhythm. No murmur appreciated. ABDOMEN:  Soft, nontender.  No  organomegaly or masses palpated. Positive bowel sounds.  MSK:  No focal spinal tenderness to palpation.  Patient has good range of motion in the upper extremities, slightly better on the left than the right. EXTREMITIES:  Lymphedema noted in the right upper extremity with compression sleeve in place. No additional peripheral edema noted. NEURO:  Nonfocal. Well oriented.  Appropriate affect.    LAB RESULTS: Lab Results  Component Value Date   WBC 5.4 01/02/2014   NEUTROABS 3.3 01/02/2014   HGB 14.1 01/02/2014   HCT 42.5 01/02/2014   MCV 93.1 01/02/2014   PLT 183 01/02/2014      Chemistry      Component Value Date/Time   NA 141 01/02/2014 1035   NA 140 06/30/2012 0841   K 4.4 01/02/2014 1035   K 4.0 06/30/2012 0841   CL 105 04/05/2013 1157   CL 108 06/30/2012 0841   CO2 23 01/02/2014 1035   CO2 25 06/30/2012 0841   BUN 21.2 01/02/2014 1035   BUN 11 06/30/2012 0841   CREATININE 0.8 01/02/2014 1035   CREATININE 0.75 06/30/2012 0841      Component Value Date/Time   CALCIUM 9.2 01/02/2014 1035   CALCIUM 9.3 06/30/2012 0841   ALKPHOS 70 01/02/2014 1035   ALKPHOS 47 06/30/2012 0841   AST 16 01/02/2014 1035   AST 16 06/30/2012 0841   ALT 16 01/02/2014 1035   ALT 14 06/30/2012 0841   BILITOT 0.23 01/02/2014 1035   BILITOT 0.5 06/30/2012 0841       STUDIES: Mm Digital Diagnostic Bilat  01/04/2014   CLINICAL DATA:  48 year old female for annual bilateral mammograms. Patient with history of bilateral breast cancers with left lumpectomy in 2002 and right lumpectomy in 2013. Patient with right lumpectomy bed seroma status post lumpectomy which was drained and decompressed. Patient is scheduled to have a right mastectomy with reconstruction at [facility] in the near future.  EXAM: DIGITAL DIAGNOSTIC  BILATERAL MAMMOGRAM WITH IMPLANTS AND CAD  Bilateral retropectoral saline implants are identified. Routine and implant displaced views are performed.  COMPARISON:  01/03/2013 and prior mammograms dating back to  12/02/2007.  ACR Breast Density Category b: There are scattered areas of fibroglandular density.  FINDINGS: Scarring within both breasts are identified, moderate to severe in  the right breast with retraction. The large seroma in the outer right breast is no longer present.  There is no evidence of suspicious mass, nonsurgical distortion or worrisome calcifications bilaterally.  Mammographic images were processed with CAD.  IMPRESSION: Bilateral breast scarring.  No findings to suggest breast malignancy.  RECOMMENDATION: Annual screening mammograms in 1 year.  I have discussed the findings and recommendations with the patient. Results were also provided in writing at the conclusion of the visit. If applicable, a reminder letter will be sent to the patient regarding the next appointment.  BI-RADS CATEGORY  2: Benign finding(s).   Electronically Signed   By: Hassan Rowan M.D.   On: 01/04/2014 10:51        ASSESSMENT: 48 y.o.  BRCA 1-2 negative [place] woman:   (1) status post left lumpectomy and sentinel lymph node biopsy December 2002 for a 1.9 cm invasive ductal carcinoma, no lymph node involvement, triple negative, treated with 4 cycles of Cytoxan and Adriamycin followed by radiation, with no evidence of disease recurrence to date.  (2) status post Right lumpectomy and axillary lymph node dissection 03/02/2012 for a T2 N2 invasive lobular breast cancer, grade 1, estrogen receptor 85% and progesterone receptor 98% positive, with HER-2 nonamplified and an MIB-1 of 9%; 10 of 10 lymph nodes involved. (3)  completed 4 cycles of docetaxel/cyclophosphamide 05/27/2012 (4)  Status post radiation therapy,completed late August 2013 (5)  she started on tamoxifen August 2013 (6)  lymphedema, right upper extremity (7) seroma cavity Right breast, resolved   PLAN: Christina Blackwell is doing well with regards to her breast cancer history. She is very nervous about the upcoming surgery in February. I have refilled her lorazepam  as requested. Otherwise, she will continue on tamoxifen as before. We will plan on seeing her back approximately 2 months after her surgery for routine followup. She knows as always to call prior that time with any changes or problems.  I will note that [name]'s nonfasting glucose was elevated last week at 175. We did repeat this during her visit today it was back to normal at 103. She still does not have an established primary care provider, and I again encouraged her to do so. Not only does she need routine physical exams, but she does need annual pelvic exams as well.   Meaghann Choo PA-C    01/09/2014

## 2014-01-09 NOTE — Telephone Encounter (Signed)
appts made and printed...td 

## 2014-02-09 ENCOUNTER — Other Ambulatory Visit: Payer: Self-pay | Admitting: Physician Assistant

## 2014-02-09 DIAGNOSIS — C50919 Malignant neoplasm of unspecified site of unspecified female breast: Secondary | ICD-10-CM

## 2014-03-15 ENCOUNTER — Other Ambulatory Visit: Payer: 59

## 2014-03-17 ENCOUNTER — Other Ambulatory Visit (HOSPITAL_BASED_OUTPATIENT_CLINIC_OR_DEPARTMENT_OTHER): Payer: 59

## 2014-03-17 DIAGNOSIS — C50919 Malignant neoplasm of unspecified site of unspecified female breast: Secondary | ICD-10-CM

## 2014-03-17 DIAGNOSIS — Z853 Personal history of malignant neoplasm of breast: Secondary | ICD-10-CM

## 2014-03-17 LAB — CBC WITH DIFFERENTIAL/PLATELET
BASO%: 0.2 % (ref 0.0–2.0)
BASOS ABS: 0 10*3/uL (ref 0.0–0.1)
EOS ABS: 0.5 10*3/uL (ref 0.0–0.5)
EOS%: 9.3 % — ABNORMAL HIGH (ref 0.0–7.0)
HEMATOCRIT: 30.6 % — AB (ref 34.8–46.6)
HEMOGLOBIN: 9.2 g/dL — AB (ref 11.6–15.9)
LYMPH#: 1.9 10*3/uL (ref 0.9–3.3)
LYMPH%: 32.1 % (ref 14.0–49.7)
MCH: 25 pg — ABNORMAL LOW (ref 25.1–34.0)
MCHC: 30.1 g/dL — AB (ref 31.5–36.0)
MCV: 83.2 fL (ref 79.5–101.0)
MONO#: 0.4 10*3/uL (ref 0.1–0.9)
MONO%: 7.4 % (ref 0.0–14.0)
NEUT#: 3 10*3/uL (ref 1.5–6.5)
NEUT%: 51 % (ref 38.4–76.8)
PLATELETS: 222 10*3/uL (ref 145–400)
RBC: 3.68 10*6/uL — ABNORMAL LOW (ref 3.70–5.45)
RDW: 14.9 % — ABNORMAL HIGH (ref 11.2–14.5)
WBC: 5.8 10*3/uL (ref 3.9–10.3)

## 2014-03-17 LAB — COMPREHENSIVE METABOLIC PANEL (CC13)
ALT: 8 U/L (ref 0–55)
AST: 13 U/L (ref 5–34)
Albumin: 3.4 g/dL — ABNORMAL LOW (ref 3.5–5.0)
Alkaline Phosphatase: 68 U/L (ref 40–150)
Anion Gap: 10 mEq/L (ref 3–11)
BUN: 18.9 mg/dL (ref 7.0–26.0)
CHLORIDE: 111 meq/L — AB (ref 98–109)
CO2: 22 mEq/L (ref 22–29)
CREATININE: 0.8 mg/dL (ref 0.6–1.1)
Calcium: 8.7 mg/dL (ref 8.4–10.4)
GLUCOSE: 150 mg/dL — AB (ref 70–140)
Potassium: 4 mEq/L (ref 3.5–5.1)
Sodium: 143 mEq/L (ref 136–145)
Total Protein: 6.4 g/dL (ref 6.4–8.3)

## 2014-03-22 ENCOUNTER — Encounter: Payer: Self-pay | Admitting: Physician Assistant

## 2014-03-22 ENCOUNTER — Ambulatory Visit (HOSPITAL_BASED_OUTPATIENT_CLINIC_OR_DEPARTMENT_OTHER): Payer: 59 | Admitting: Physician Assistant

## 2014-03-22 ENCOUNTER — Telehealth: Payer: Self-pay | Admitting: Oncology

## 2014-03-22 VITALS — BP 103/69 | HR 89 | Temp 98.1°F | Resp 20 | Ht 65.0 in | Wt 164.9 lb

## 2014-03-22 DIAGNOSIS — Z853 Personal history of malignant neoplasm of breast: Secondary | ICD-10-CM

## 2014-03-22 DIAGNOSIS — I890 Lymphedema, not elsewhere classified: Secondary | ICD-10-CM

## 2014-03-22 DIAGNOSIS — R61 Generalized hyperhidrosis: Secondary | ICD-10-CM

## 2014-03-22 DIAGNOSIS — R7309 Other abnormal glucose: Secondary | ICD-10-CM

## 2014-03-22 DIAGNOSIS — C50919 Malignant neoplasm of unspecified site of unspecified female breast: Secondary | ICD-10-CM

## 2014-03-22 DIAGNOSIS — F411 Generalized anxiety disorder: Secondary | ICD-10-CM

## 2014-03-22 DIAGNOSIS — C50911 Malignant neoplasm of unspecified site of right female breast: Secondary | ICD-10-CM

## 2014-03-22 DIAGNOSIS — Z170 Estrogen receptor positive status [ER+]: Secondary | ICD-10-CM

## 2014-03-22 DIAGNOSIS — F419 Anxiety disorder, unspecified: Secondary | ICD-10-CM

## 2014-03-22 DIAGNOSIS — T451X5A Adverse effect of antineoplastic and immunosuppressive drugs, initial encounter: Secondary | ICD-10-CM

## 2014-03-22 DIAGNOSIS — R232 Flushing: Secondary | ICD-10-CM

## 2014-03-22 DIAGNOSIS — D649 Anemia, unspecified: Secondary | ICD-10-CM

## 2014-03-22 DIAGNOSIS — G4700 Insomnia, unspecified: Secondary | ICD-10-CM

## 2014-03-22 MED ORDER — LORAZEPAM 0.5 MG PO TABS: 0.5000 mg | ORAL_TABLET | Freq: Two times a day (BID) | ORAL | Status: DC | PRN

## 2014-03-22 NOTE — Telephone Encounter (Signed)
, °

## 2014-03-22 NOTE — Progress Notes (Signed)
ID: Christina Blackwell   DOB: [birth year]  MR#: [id number]  OEV#:[id number]   PCP:  GYN:   SUR:  Stark Klein, MD [place]:  Tyler Pita, MD OTHER:  Crissie Reese, MD;   Jacinto Reap, MD (Plastic Surgery, [place]; [place] [phone]) Josepha Pigg, MD ([facility], [place], [place] # [phone]) Melburn Hake, MD ([facility])  CHIEF COMPLAINT:  Hx of Bilateral Breast Cancers    HISTORY OF PRESENT ILLNESS: Christina Blackwell is an [place] woman formerly followed here for a left-sided stage I invasive ductal carcinoma, which was treated with adjuvant chemotherapy completed in 2003. She was released from followup 2012.   On 12/28/2011 she had bilateral screening mammography showing a possible area of distortion in the right breast. She was recalled for additional views 01/09/2012 and this confirmed an area of architectural distortion in the lateral aspect of the right breast measuring 2.9 cm. This was not palpable by exam. Biopsy was performed the same day and showed ([id number]) and invasive lobular breast cancer, e-cadherin negative, strongly estrogen receptor positive at 85%, progesterone receptor positive at 98%, with an MIB-1 of 9%, and no HER-2 amplification.  Patient is status post right lumpectomy and x-ray lymph node dissection 03/02/2012 for a T2 N2 invasive lobular breast carcinoma, grade 1. 10 of 10 lymph nodes were positive. Margins were close but negative.   Her subsequent history is as detailed below  INTERVAL HISTORY: Christina Blackwell returns alone today for followup of her right breast carcinoma and remote history of left breast carcinoma.  She continues on tamoxifen with good tolerance. She continues to have hot flashes, and continues to have problems with insomnia. She takes gabapentin, 300 mg, at bedtime, and also utilizes lorazepam occasionally at night, but sparingly.  Interval history is notable for the fact that Christina Blackwell has participated in a study at [facility] which involves lymph node transplant. She had her  final surgery in February with no complications. This involved a right mastectomy with TRAM reconstruction, in addition to the actual lymph node transplant. She is healing well, and denies any problems with pain. She is still wearing her sleeve on the right upper extremity, but has noticed already does some of the swelling is improving, especially in her right hand.    REVIEW OF SYSTEMS: Christina Blackwell denies any recent illnesses and has had no fevers or chills. She denies rashes or skin changes, and has had no abnormal bruising or bleeding. Specifically, she denies any vaginal bleeding, and she also denies any significant vaginal dryness or discharge. Her energy level is fair. She feels like she is still recovering from surgery. Her appetite is good, and she denies any nausea, emesis or  change in bowel or bladder habits. She has had no new cough, phlegm production, increased shortness of breath, swelling in the lower extremities, palpitations or chest pain. She has had no abnormal headaches and denies any dizziness or change in vision.  She currently denies any unusual myalgias, arthralgias, or bony pain. She continues to have some problems with anxiety.  A detailed review of systems is otherwise stable and noncontributory.    PAST MEDICAL HISTORY: Past Medical History  Diagnosis Date  . History of bilateral breast implants   . GERD (gastroesophageal reflux disease)   . Complication of anesthesia   . PONV (postoperative nausea and vomiting)   . History of radiation therapy 03/30/02-05/16/02    left breast   . History of chemotherapy     cyclosphosphamide/ doxorubicin  . Breast cancer 2002    left   .  Breast cancer 2012    right  . Anxiety   . Depression   . History of chemotherapy 4th:June 13,2013    taxol  . History of radiation therapy 06/22/12 -08/06/12    right breast  . Seroma 12/2012 u/s xray    right breast/aspiration 01/19/13    PAST SURGICAL HISTORY: Past Surgical History  Procedure  Laterality Date  . Portacath placement    . Portacath removal    . Tonsillectomy and adenoidectomy    . Breast lumpectomy  11/2001    left  . Sentinel lymph node biopsy  11/2001    left axillary  . Breast enhancement surgery  2003    bilateral  . Breast lumpectomy w/ needle localization  02/05/2012    right/ slnbx  . Mirena iud      still in place as of 06/09/12  . Axillary lymph node dissection  03/02/2012    Procedure: AXILLARY LYMPH NODE DISSECTION;  Surgeon: Shann Medal, MD;  Location: [facility];  Service: General;  Laterality: Right;    FAMILY HISTORY Family History  Problem Relation Age of Onset  . Cancer Mother     breast  . Cancer Paternal Aunt     breast  . Cancer Cousin     breast ca 23's 1st cousin    Gynecologic history:  Menarche age 12, she is GX P0. She is currently using a Mirena implant  and is considering bilateral salpingo-oophorectomy.  Social history:  (updated 03/22/2014)  She works for [employer] in [place], but her full-time job was eliminated, and she is temporarily employed by them at present. She is divorced and lives by herself, with 2 birds, an [place] and a cockateal. She is not a Ambulance person.   ADVANCED DIRECTIVES: in place   HEALTH MAINTENANCE:  (updated 03/22/2014) History  Substance Use Topics  . Smoking status: Never Smoker   . Smokeless tobacco: Never Used  . Alcohol Use: Yes     Comment: social occasio0nally 1-2      Colonoscopy: Never  PAP: Not on file  Bone density: Never  Lipid panel: Not on file    Allergies  Allergen Reactions  . Rocuronium Anaphylaxis    Current Outpatient Prescriptions  Medication Sig Dispense Refill  . cetirizine (ZYRTEC) 10 MG tablet Take 10 mg by mouth as needed.      . gabapentin (NEURONTIN) 300 MG capsule TAKE 1 CAPSULE (300 MG TOTAL) BY MOUTH AT BEDTIME.  30 capsule  5  . ibuprofen (ADVIL,MOTRIN) 200 MG tablet Take 200 mg by mouth every 6 (six)  hours as needed.      Marland Kitchen LORazepam (ATIVAN) 0.5 MG tablet Take 1 tablet (0.5 mg total) by mouth 2 (two) times daily as needed for anxiety or sleep.  45 tablet  0  . Multiple Vitamin (MULTIVITAMIN) tablet Take 1 tablet by mouth daily.      Marland Kitchen omeprazole (PRILOSEC) 20 MG capsule Take 20 mg by mouth daily.      Marland Kitchen oxyCODONE (OXY IR/ROXICODONE) 5 MG immediate release tablet       . tamoxifen (NOLVADEX) 20 MG tablet TAKE 1 TABLET BY MOUTH DAILY.  90 tablet  2  . cloNIDine (CATAPRES) 0.1 MG tablet Take 1 tablet (0.1 mg total) by mouth daily.  30 tablet  5   No current facility-administered medications for this visit.   Facility-Administered Medications Ordered in Other Visits  Medication Dose Route Frequency Provider Last Rate Last Dose  .  topical emolient (BIAFINE) emulsion   Topical Daily Lora Paula, MD        OBJECTIVE: Middle-aged white woman who appears comfortable and is in no acute distress  Filed Vitals:   03/22/14 0848  BP: 103/69  Pulse: 89  Temp: 98.1 F (36.7 C)  Resp: 20     Body mass index is 27.44 kg/(m^2).    ECOG FS: 0  Filed Weights   03/22/14 0848  Weight: 164 lb 14.4 oz (74.798 kg)   Physical Exam: HEENT:  Sclerae anicteric.  Oropharynx clear, pink and moist. Good dentition. No mucositis or candidiasis. Next supple, trachea midline. No thyromegaly. NODES:  No cervical or supraclavicular lymphadenopathy palpated.  BREAST EXAM:  Right breast is status post mastectomy with TRAM reconstruction. Incision is healing well. There no suspicious nodularities or skin changes, and no evidence of local recurrence. Left breast is status post remote lumpectomy, with no evidence of local recurrence. Axillae are benign bilaterally, no palpable lymphadenopathy. LUNGS:  Clear to auscultation bilaterally with good excursion.  No wheezes or rhonchi HEART:  Regular rate and rhythm. No murmur appreciated. ABDOMEN:  Soft, nontender.  No organomegaly or masses palpated. Positive bowel  sounds. Horizontal incision in the lower abdomen is healing well. MSK:  No focal spinal tenderness to palpation.  Patient has good range of motion in the upper extremities, slightly better on the left than the right. EXTREMITIES:  Lymphedema noted in the right upper extremity with compression sleeve in place. There is no swelling noted in the right hand today. No additional peripheral edema noted. NEURO:  Nonfocal. Well oriented.  Appropriate affect.    LAB RESULTS: Lab Results  Component Value Date   WBC 5.8 03/17/2014   NEUTROABS 3.0 03/17/2014   HGB 9.2* 03/17/2014   HCT 30.6* 03/17/2014   MCV 83.2 03/17/2014   PLT 222 03/17/2014      Chemistry      Component Value Date/Time   NA 143 03/17/2014 1553   NA 140 06/30/2012 0841   K 4.0 03/17/2014 1553   K 4.0 06/30/2012 0841   CL 105 04/05/2013 1157   CL 108 06/30/2012 0841   CO2 22 03/17/2014 1553   CO2 25 06/30/2012 0841   BUN 18.9 03/17/2014 1553   BUN 11 06/30/2012 0841   CREATININE 0.8 03/17/2014 1553   CREATININE 0.75 06/30/2012 0841      Component Value Date/Time   CALCIUM 8.7 03/17/2014 1553   CALCIUM 9.3 06/30/2012 0841   ALKPHOS 68 03/17/2014 1553   ALKPHOS 47 06/30/2012 0841   AST 13 03/17/2014 1553   AST 16 06/30/2012 0841   ALT 8 03/17/2014 1553   ALT 14 06/30/2012 0841   BILITOT <0.20 03/17/2014 1553   BILITOT 0.5 06/30/2012 0841       STUDIES: Mm Digital Diagnostic Bilat 01/04/2014   CLINICAL DATA:  48 year old female for annual bilateral mammograms. Patient with history of bilateral breast cancers with left lumpectomy in 2002 and right lumpectomy in 2013. Patient with right lumpectomy bed seroma status post lumpectomy which was drained and decompressed. Patient is scheduled to have a right mastectomy with reconstruction at [facility] in the near future.  EXAM: DIGITAL DIAGNOSTIC  BILATERAL MAMMOGRAM WITH IMPLANTS AND CAD  Bilateral retropectoral saline implants are identified. Routine and implant displaced views are performed.  COMPARISON:  01/03/2013  and prior mammograms dating back to 12/02/2007.  ACR Breast Density Category b: There are scattered areas of fibroglandular density.  FINDINGS: Scarring within both breasts  are identified, moderate to severe in the right breast with retraction. The large seroma in the outer right breast is no longer present.  There is no evidence of suspicious mass, nonsurgical distortion or worrisome calcifications bilaterally.  Mammographic images were processed with CAD.  IMPRESSION: Bilateral breast scarring.  No findings to suggest breast malignancy.  RECOMMENDATION: Annual screening mammograms in 1 year.  I have discussed the findings and recommendations with the patient. Results were also provided in writing at the conclusion of the visit. If applicable, a reminder letter will be sent to the patient regarding the next appointment.  BI-RADS CATEGORY  2: Benign finding(s).   Electronically Signed   By: Hassan Rowan M.D.   On: 01/04/2014 10:51        ASSESSMENT: 48 y.o.  BRCA 1-2 negative [place] woman:   (1) status post left lumpectomy and sentinel lymph node biopsy December 2002 for a 1.9 cm invasive ductal carcinoma, no lymph node involvement, triple negative, treated with 4 cycles of Cytoxan and Adriamycin followed by radiation, with no evidence of disease recurrence to date.  (2) status post Right lumpectomy and axillary lymph node dissection 03/02/2012 for a T2 N2 invasive lobular breast cancer, grade 1, estrogen receptor 85% and progesterone receptor 98% positive, with HER-2 nonamplified and an MIB-1 of 9%; 10 of 10 lymph nodes involved. (3)  completed 4 cycles of docetaxel/cyclophosphamide 05/27/2012 (4)  Status post radiation therapy,completed late August 2013 (5)  she started on tamoxifen August 2013 (6)  lymphedema, right upper extremity (7) seroma cavity Right breast, resolved (8)  status post surgery at [facility] in February 2015 consisting of a right mastectomy with TRAM reconstruction, and lymph node  transplant in the right axilla   PLAN: Christina Blackwell seems to be recovering well from her recent surgery, and with regards to her breast cancer, I see no clinical evidence of disease recurrence. She'll continue on the tamoxifen as before which she seems to be tolerating well.   We did discuss her hot flashes and her insomnia. I have refilled her lorazepam which she takes sparingly at bedtime.  I also recommended that she try increasing her gabapentin dose to 600 mg at bedtime, which I think would help with both the insomnia and the nighttime hot flashes. If that seems to work, she can let us know, and we will send her a new prescription for the gabapentin (300 mg, 2 tablets by mouth each bedtime when necessary for hot flashes).  Otherwise, we will see her back for routine followup in August or early September, consisting of labs and physical exam. She voices her understanding and agreement with the above, and knows to call at anytime should she have any changes or problems.   I will note that Christina Blackwell's glucose was again slightly elevated today, and she has had some intermittent elevations over the past several months. She still does not have an established primary care provider which I have encouraged her to find, and I have recommended one of the [facility] practices which is actually located near her home in [place].  She also needs annual pelvic exams while on the tamoxifen, and these can also be done through her primary care physician.    Jewels Langone Milda Smart PA-C    03/22/2014

## 2014-05-09 ENCOUNTER — Other Ambulatory Visit: Payer: Self-pay | Admitting: Physician Assistant

## 2014-05-15 ENCOUNTER — Telehealth: Payer: Self-pay | Admitting: *Deleted

## 2014-05-15 NOTE — Telephone Encounter (Signed)
This RN returned to pt per her message inquiring about a medication prescribed by her primary MD.  Per phone discussion- Cardelia states she went to a DR Prentice Docker in [place] " and I thought she was part of [facility] and that she had my records '  " she asked me what my main problem was and I told her hot flashes that interrupted my sleep" " she wrote me a prescription for prometrium - but can I take that ?"  This RN reviewed her records and noted ER/PR + status and advised pt best not to take medication " that is what I thought "  " I knew if there was something that would help me Amy would have already given it to me "  Of note pt has tried effexor and gabapentin with no benefit.  " it is ok- it is just what I have to tolerate right now ".

## 2014-07-10 ENCOUNTER — Other Ambulatory Visit: Payer: Self-pay | Admitting: *Deleted

## 2014-07-10 DIAGNOSIS — C50919 Malignant neoplasm of unspecified site of unspecified female breast: Secondary | ICD-10-CM

## 2014-07-10 MED ORDER — GABAPENTIN 300 MG PO CAPS: ORAL_CAPSULE | ORAL | Status: DC

## 2014-07-24 ENCOUNTER — Other Ambulatory Visit (HOSPITAL_BASED_OUTPATIENT_CLINIC_OR_DEPARTMENT_OTHER): Payer: 59

## 2014-07-24 DIAGNOSIS — C50911 Malignant neoplasm of unspecified site of right female breast: Secondary | ICD-10-CM

## 2014-07-24 DIAGNOSIS — C50919 Malignant neoplasm of unspecified site of unspecified female breast: Secondary | ICD-10-CM

## 2014-07-24 DIAGNOSIS — Z853 Personal history of malignant neoplasm of breast: Secondary | ICD-10-CM

## 2014-07-24 DIAGNOSIS — D649 Anemia, unspecified: Secondary | ICD-10-CM

## 2014-07-24 LAB — COMPREHENSIVE METABOLIC PANEL
ALBUMIN: 3.9 g/dL (ref 3.5–5.2)
ALT: 16 U/L (ref 0–35)
AST: 20 U/L (ref 0–37)
Alkaline Phosphatase: 64 U/L (ref 39–117)
BUN: 16 mg/dL (ref 6–23)
CALCIUM: 9.4 mg/dL (ref 8.4–10.5)
CHLORIDE: 106 meq/L (ref 96–112)
CO2: 21 meq/L (ref 19–32)
CREATININE: 0.78 mg/dL (ref 0.50–1.10)
Glucose, Bld: 90 mg/dL (ref 70–99)
POTASSIUM: 4.1 meq/L (ref 3.5–5.3)
Sodium: 141 mEq/L (ref 135–145)
Total Bilirubin: 0.3 mg/dL (ref 0.2–1.2)
Total Protein: 6.8 g/dL (ref 6.0–8.3)

## 2014-07-24 LAB — CBC WITH DIFFERENTIAL/PLATELET
BASO%: 0.5 % (ref 0.0–2.0)
Basophils Absolute: 0 10*3/uL (ref 0.0–0.1)
EOS ABS: 0.4 10*3/uL (ref 0.0–0.5)
EOS%: 6.7 % (ref 0.0–7.0)
HCT: 34.5 % — ABNORMAL LOW (ref 34.8–46.6)
HGB: 10 g/dL — ABNORMAL LOW (ref 11.6–15.9)
LYMPH#: 2.1 10*3/uL (ref 0.9–3.3)
LYMPH%: 37.5 % (ref 14.0–49.7)
MCH: 21.7 pg — ABNORMAL LOW (ref 25.1–34.0)
MCHC: 29 g/dL — ABNORMAL LOW (ref 31.5–36.0)
MCV: 75 fL — ABNORMAL LOW (ref 79.5–101.0)
MONO#: 0.5 10*3/uL (ref 0.1–0.9)
MONO%: 8.5 % (ref 0.0–14.0)
NEUT%: 46.8 % (ref 38.4–76.8)
NEUTROS ABS: 2.6 10*3/uL (ref 1.5–6.5)
NRBC: 0 % (ref 0–0)
PLATELETS: 225 10*3/uL (ref 145–400)
RBC: 4.6 10*6/uL (ref 3.70–5.45)
RDW: 18.9 % — ABNORMAL HIGH (ref 11.2–14.5)
WBC: 5.5 10*3/uL (ref 3.9–10.3)

## 2014-07-31 ENCOUNTER — Ambulatory Visit (HOSPITAL_BASED_OUTPATIENT_CLINIC_OR_DEPARTMENT_OTHER): Payer: 59 | Admitting: Oncology

## 2014-07-31 ENCOUNTER — Telehealth: Payer: Self-pay | Admitting: Oncology

## 2014-07-31 VITALS — BP 114/57 | HR 91 | Temp 98.0°F | Resp 18 | Ht 65.0 in | Wt 181.0 lb

## 2014-07-31 DIAGNOSIS — F411 Generalized anxiety disorder: Secondary | ICD-10-CM

## 2014-07-31 DIAGNOSIS — C50912 Malignant neoplasm of unspecified site of left female breast: Secondary | ICD-10-CM

## 2014-07-31 DIAGNOSIS — F419 Anxiety disorder, unspecified: Secondary | ICD-10-CM

## 2014-07-31 DIAGNOSIS — I890 Lymphedema, not elsewhere classified: Secondary | ICD-10-CM

## 2014-07-31 DIAGNOSIS — G4700 Insomnia, unspecified: Secondary | ICD-10-CM

## 2014-07-31 DIAGNOSIS — C50919 Malignant neoplasm of unspecified site of unspecified female breast: Secondary | ICD-10-CM

## 2014-07-31 MED ORDER — GABAPENTIN 300 MG PO CAPS: ORAL_CAPSULE | ORAL | Status: DC

## 2014-07-31 MED ORDER — LORAZEPAM 0.5 MG PO TABS: 0.5000 mg | ORAL_TABLET | Freq: Two times a day (BID) | ORAL | Status: DC | PRN

## 2014-07-31 MED ORDER — ANASTROZOLE 1 MG PO TABS
1.0000 mg | ORAL_TABLET | Freq: Every day | ORAL | Status: DC
Start: 2014-07-31 — End: 2015-02-05

## 2014-07-31 NOTE — Addendum Note (Signed)
Addended by: Amelia Jo I on: 07/31/2014 10:19 AM   Modules accepted: Orders, Medications

## 2014-07-31 NOTE — Progress Notes (Signed)
ID: [name]   DOB: 04/18/1966  MR#: [id number]  CSN#:[id number]   PCP:  GYN:   SUR:  Faera Byerly, MD RADONC:  Matthew Manning, MD OTHER:  David Bowers, MD;   Scott Hollenbeck, MD (Plastic Surgery, [facility]; FAX [phone]) Rachel Greenup, MD (Onc Surgery, [facility], FAX # [phone]) Suhail, Mithani, MD ([facility])  CHIEF COMPLAINT:  Hx of Bilateral Breast Cancers  CURRENT THERAPY: Anti-estrogens    HISTORY OF PRESENT ILLNESS: From the original intake note:  Christina Blackwell is an [place] woman formerly followed here for a left-sided stage I invasive ductal carcinoma, which was treated with adjuvant chemotherapy completed in 2003. She was released from followup 2012.   On 12/28/2011 she had bilateral screening mammography showing a possible area of distortion in the right breast. She was recalled for additional views 01/09/2012 and this confirmed an area of architectural distortion in the lateral aspect of the right breast measuring 2.9 cm. This was not palpable by exam. Biopsy was performed the same day and showed ([id number]) and invasive lobular breast cancer, e-cadherin negative, strongly estrogen receptor positive at 85%, progesterone receptor positive at 98%, with an MIB-1 of 9%, and no HER-2 amplification.  Patient is status post right lumpectomy and x-ray lymph node dissection 03/02/2012 for a T2 N2 invasive lobular breast carcinoma, grade 1. 10 of 10 lymph nodes were positive. Margins were close but negative.   Her subsequent history is as detailed below  INTERVAL HISTORY: Christina Blackwell returns today for followup of her bilateral breast cancers. She continues on tamoxifen. She is now tolerating this well. Hot flashes are "awful" during the day, just about every hour, and they wake her up at night even though she is not taking gabapentin 600 mg at that time. She is not having any other side effects but those are "plan B.".  REVIEW OF SYSTEMS: Christina Blackwell walks once or twice daily, chiefly at work.  Aside from the hot flashes, and in some that may cause, she has mild problems with anxiety and depression. She feels the surgery she had a [facility] to reconnect lymph nodes in her right armpit using left inguinal lymph node transfers, has worked some. She is scheduled for further surgery at [facility] to remove a very hard scar or seroma area in the lateral aspect of the reconstructed right breast. Aside from these issues a detailed review of systems today was noncontributory  PAST MEDICAL HISTORY: Past Medical History  Diagnosis Date  . History of bilateral breast implants   . GERD (gastroesophageal reflux disease)   . Complication of anesthesia   . PONV (postoperative nausea and vomiting)   . History of radiation therapy 03/30/02-05/16/02    left breast   . History of chemotherapy     cyclosphosphamide/ doxorubicin  . Breast cancer 2002    left   . Breast cancer 2012    right  . Anxiety   . Depression   . History of chemotherapy 4th:June 13,2013    taxol  . History of radiation therapy 06/22/12 -08/06/12    right breast  . Seroma 12/2012 u/s xray    right breast/aspiration 01/19/13    PAST SURGICAL HISTORY: Past Surgical History  Procedure Laterality Date  . Portacath placement    . Portacath removal    . Tonsillectomy and adenoidectomy    . Breast lumpectomy  11/2001    left  . Sentinel lymph node biopsy  11/2001    left axillary  . Breast enhancement surgery  2003    bilateral  .   Breast lumpectomy w/ needle localization  02/05/2012    right/ slnbx  . Mirena iud      still in place as of 06/09/12  . Axillary lymph node dissection  03/02/2012    Procedure: AXILLARY LYMPH NODE DISSECTION;  Surgeon: David H Newman, MD;  Location: [facility];  Service: General;  Laterality: Right;    FAMILY HISTORY Family History  Problem Relation Age of Onset  . Cancer Mother     breast  . Cancer Paternal Aunt     breast  . Cancer Cousin     breast ca 50's 1st cousin    Gynecologic  history:  Menarche age 12, she is GX P0. She is currently using a Mirena implant  and is considering bilateral salpingo-oophorectomy.  Social history:  (updated 03/22/2014)  She works for [employer] in [place]. She is divorced and lives by herself, with 2 birds, an Amazon parrot and a cockateal. She is not a church attender.   ADVANCED DIRECTIVES: in place   HEALTH MAINTENANCE:  (updated 03/22/2014) History  Substance Use Topics  . Smoking status: Never Smoker   . Smokeless tobacco: Never Used  . Alcohol Use: Yes     Comment: social occasio0nally 1-2      Colonoscopy: Never  PAP: Not on file  Bone density: Never  Lipid panel: Not on file    Allergies  Allergen Reactions  . Rocuronium Anaphylaxis    Current Outpatient Prescriptions  Medication Sig Dispense Refill  . cetirizine (ZYRTEC) 10 MG tablet Take 10 mg by mouth as needed.      . cloNIDine (CATAPRES) 0.1 MG tablet Take 1 tablet (0.1 mg total) by mouth daily.  30 tablet  5  . gabapentin (NEURONTIN) 300 MG capsule TAKE 1 CAPSULE (300 MG TOTAL) BY MOUTH AT BEDTIME.  30 capsule  0  . ibuprofen (ADVIL,MOTRIN) 200 MG tablet Take 200 mg by mouth every 6 (six) hours as needed.      . LORazepam (ATIVAN) 0.5 MG tablet Take 1 tablet (0.5 mg total) by mouth 2 (two) times daily as needed for anxiety or sleep.  45 tablet  0  . Multiple Vitamin (MULTIVITAMIN) tablet Take 1 tablet by mouth daily.      . omeprazole (PRILOSEC) 20 MG capsule Take 20 mg by mouth daily.      . oxyCODONE (OXY IR/ROXICODONE) 5 MG immediate release tablet       . tamoxifen (NOLVADEX) 20 MG tablet TAKE 1 TABLET BY MOUTH DAILY.  90 tablet  0   No current facility-administered medications for this visit.   Facility-Administered Medications Ordered in Other Visits  Medication Dose Route Frequency Provider Last Rate Last Dose  . topical emolient (BIAFINE) emulsion   Topical Daily Matthew A Manning, MD        OBJECTIVE: Middle-aged white  woman in no acute distress  Filed Vitals:   07/31/14 0856  BP: 114/57  Pulse: 91  Temp: 98 F (36.7 C)  Resp: 18     Body mass index is 30.12 kg/(m^2).    ECOG FS: 0  Filed Weights   07/31/14 0856  Weight: 181 lb (82.101 kg)   Sclerae unicteric, pupils equal and reactive Oropharynx clear and moist-- no thrush No cervical or supraclavicular adenopathy Lungs no rales or rhonchi Heart regular rate and rhythm Abd soft, nontender, positive bowel sounds MSK no focal spinal tenderness, grade 1 right upper extremity lymphedema Neuro: nonfocal, well oriented, anxious affect Breasts: The right   breast is status post mastectomy with TRAM reconstruction. There is no evidence of local recurrence. There is a very hard area measuring about 6 x 4 cm in the lateral inferior aspect of the reconstructed breast, which is not erythematous or tender. I do not palpate any adenopathy in the right axilla. The left breast is status post lumpectomy. There is no evidence of local recurrence. The left axilla is benign     LAB RESULTS: Lab Results  Component Value Date   WBC 5.5 07/24/2014   NEUTROABS 2.6 07/24/2014   HGB 10.0* 07/24/2014   HCT 34.5* 07/24/2014   MCV 75.0* 07/24/2014   PLT 225 07/24/2014      Chemistry      Component Value Date/Time   NA 141 07/24/2014 0818   NA 143 03/17/2014 1553   K 4.1 07/24/2014 0818   K 4.0 03/17/2014 1553   CL 106 07/24/2014 0818   CL 105 04/05/2013 1157   CO2 21 07/24/2014 0818   CO2 22 03/17/2014 1553   BUN 16 07/24/2014 0818   BUN 18.9 03/17/2014 1553   CREATININE 0.78 07/24/2014 0818   CREATININE 0.8 03/17/2014 1553      Component Value Date/Time   CALCIUM 9.4 07/24/2014 0818   CALCIUM 8.7 03/17/2014 1553   ALKPHOS 64 07/24/2014 0818   ALKPHOS 68 03/17/2014 1553   AST 20 07/24/2014 0818   AST 13 03/17/2014 1553   ALT 16 07/24/2014 0818   ALT 8 03/17/2014 1553   BILITOT 0.3 07/24/2014 0818   BILITOT <0.20 03/17/2014 1553       STUDIES: Mm Digital Diagnostic  Bilat 01/04/2014   CLINICAL DATA:  47-year-old female for annual bilateral mammograms. Patient with history of bilateral breast cancers with left lumpectomy in 2002 and right lumpectomy in 2013. Patient with right lumpectomy bed seroma status post lumpectomy which was drained and decompressed. Patient is scheduled to have a right mastectomy with reconstruction at [facility] in the near future.  EXAM: DIGITAL DIAGNOSTIC  BILATERAL MAMMOGRAM WITH IMPLANTS AND CAD  Bilateral retropectoral saline implants are identified. Routine and implant displaced views are performed.  COMPARISON:  01/03/2013 and prior mammograms dating back to 12/02/2007.  ACR Breast Density Category b: There are scattered areas of fibroglandular density.  FINDINGS: Scarring within both breasts are identified, moderate to severe in the right breast with retraction. The large seroma in the outer right breast is no longer present.  There is no evidence of suspicious mass, nonsurgical distortion or worrisome calcifications bilaterally.  Mammographic images were processed with CAD.  IMPRESSION: Bilateral breast scarring.  No findings to suggest breast malignancy.  RECOMMENDATION: Annual screening mammograms in 1 year.  I have discussed the findings and recommendations with the patient. Results were also provided in writing at the conclusion of the visit. If applicable, a reminder letter will be sent to the patient regarding the next appointment.  BI-RADS CATEGORY  2: Benign finding(s).   Electronically Signed   By: Jeff  Hu M.D.   On: 01/04/2014 10:51      ASSESSMENT: 48 y.o.  BRCA 1-2 negative [place] woman:   (1) status post left lumpectomy and sentinel lymph node biopsy December 2002 for a 1.9 cm invasive ductal carcinoma, no lymph node involvement, triple negative, treated with 4 cycles of Cytoxan and Adriamycin followed by radiation, with no evidence of disease recurrence to date.  (2) status post Right lumpectomy and axillary lymph node  dissection 03/02/2012 for a T2 N2 invasive lobular breast cancer,   grade 1, estrogen receptor 85% and progesterone receptor 98% positive, with HER-2 nonamplified and an MIB-1 of 9%; 10 of 10 lymph nodes involved. (3)  completed 4 cycles of docetaxel/cyclophosphamide 05/27/2012 (4)  Status post radiation therapy,completed late August 2013 (5)  she started on tamoxifen August 2013 (6)  lymphedema, right upper extremity (7) seroma cavity Right breast, resolved (8)  status post surgery at [facility] in February 2015 consisting of a right mastectomy with TRAM reconstruction, and lymph node transfer (from left groin to right axilla)   PLAN: Christina Blackwell has completed 2 years of tamoxifen. We certainly could continue to a total of 10 years, which would not be unreasonable in this young woman, but she is a candidate for switching to an aromatase inhibitor at this point I wonder whether she might not have fewer side effects from that.  We discussed the possible toxicities, side effects and complications of anastrozole and also cost issues. I went ahead and suggested she stop the tamoxifen as of the last day in August, give herself a 2 month break, and start the anastrozole first day in November. She will see me again in early December, and before that visit we will do a bone density as well as obtain [place] and estradiol levels to confirm that she is postmenopausal (she has not had a period in 5 years).  She will make particularly note of her symptoms late October. If her hot flashes are just as bad then, then clearly tamoxifen was not the main culprit and continuing on tamoxifen may be more of a choice.  She is having some vaginal dryness and loss of libido issues. I am referring her to our "pelvic health" program.  NG has a good understanding of the overall plan. She agrees with that. She knows a goal of treatment in her case is cure. She will call with any problems that may develop before next visit  here.   Chauncey Cruel, MD     07/31/2014

## 2014-08-01 ENCOUNTER — Other Ambulatory Visit: Payer: Self-pay | Admitting: *Deleted

## 2014-08-01 DIAGNOSIS — C50919 Malignant neoplasm of unspecified site of unspecified female breast: Secondary | ICD-10-CM

## 2014-08-01 MED ORDER — GABAPENTIN 300 MG PO CAPS: ORAL_CAPSULE | ORAL | Status: DC

## 2014-09-08 ENCOUNTER — Telehealth: Payer: Self-pay | Admitting: *Deleted

## 2014-09-08 ENCOUNTER — Other Ambulatory Visit: Payer: Self-pay | Admitting: *Deleted

## 2014-09-08 DIAGNOSIS — Z853 Personal history of malignant neoplasm of breast: Secondary | ICD-10-CM

## 2014-09-08 DIAGNOSIS — I890 Lymphedema, not elsewhere classified: Secondary | ICD-10-CM

## 2014-09-08 DIAGNOSIS — R222 Localized swelling, mass and lump, trunk: Secondary | ICD-10-CM

## 2014-09-08 DIAGNOSIS — C50919 Malignant neoplasm of unspecified site of unspecified female breast: Secondary | ICD-10-CM

## 2014-09-08 NOTE — Telephone Encounter (Signed)
Per discussion with pt per concerns with prior PET and MD evaluation and review with MD order placed for PET scan for re-evaluation of noted areas of concern.  Pt aware above is in process of being scheduling per approval of insurance and will be done prior to next MD appointment.

## 2014-11-03 ENCOUNTER — Ambulatory Visit (HOSPITAL_COMMUNITY)
Admission: RE | Admit: 2014-11-03 | Discharge: 2014-11-03 | Disposition: A | Discharge status: Home / Self Care | Payer: 59 | Source: Ambulatory Visit | Attending: Oncology | Admitting: Oncology

## 2014-11-03 ENCOUNTER — Other Ambulatory Visit (HOSPITAL_BASED_OUTPATIENT_CLINIC_OR_DEPARTMENT_OTHER): Payer: 59

## 2014-11-03 ENCOUNTER — Ambulatory Visit
Admission: RE | Admit: 2014-11-03 | Discharge: 2014-11-03 | Disposition: A | Discharge status: Home / Self Care | Payer: 59 | Source: Ambulatory Visit | Attending: Oncology | Admitting: Oncology

## 2014-11-03 ENCOUNTER — Encounter (HOSPITAL_COMMUNITY): Payer: Self-pay

## 2014-11-03 DIAGNOSIS — C50919 Malignant neoplasm of unspecified site of unspecified female breast: Secondary | ICD-10-CM | POA: Diagnosis present

## 2014-11-03 DIAGNOSIS — C50811 Malignant neoplasm of overlapping sites of right female breast: Secondary | ICD-10-CM

## 2014-11-03 DIAGNOSIS — C50912 Malignant neoplasm of unspecified site of left female breast: Secondary | ICD-10-CM

## 2014-11-03 DIAGNOSIS — Z853 Personal history of malignant neoplasm of breast: Secondary | ICD-10-CM

## 2014-11-03 DIAGNOSIS — R222 Localized swelling, mass and lump, trunk: Secondary | ICD-10-CM

## 2014-11-03 DIAGNOSIS — I890 Lymphedema, not elsewhere classified: Secondary | ICD-10-CM

## 2014-11-03 LAB — CBC WITH DIFFERENTIAL/PLATELET
BASO%: 0.4 % (ref 0.0–2.0)
Basophils Absolute: 0 10*3/uL (ref 0.0–0.1)
EOS%: 0.2 % (ref 0.0–7.0)
Eosinophils Absolute: 0 10*3/uL (ref 0.0–0.5)
HCT: 31.5 % — ABNORMAL LOW (ref 34.8–46.6)
HGB: 9.2 g/dL — ABNORMAL LOW (ref 11.6–15.9)
LYMPH%: 19.4 % (ref 14.0–49.7)
MCH: 20.4 pg — ABNORMAL LOW (ref 25.1–34.0)
MCHC: 29.2 g/dL — ABNORMAL LOW (ref 31.5–36.0)
MCV: 69.7 fL — ABNORMAL LOW (ref 79.5–101.0)
MONO#: 0.4 10*3/uL (ref 0.1–0.9)
MONO%: 4.8 % (ref 0.0–14.0)
NEUT#: 6.4 10*3/uL (ref 1.5–6.5)
NEUT%: 75.2 % (ref 38.4–76.8)
Platelets: 292 10*3/uL (ref 145–400)
RBC: 4.52 10*6/uL (ref 3.70–5.45)
RDW: 19.2 % — ABNORMAL HIGH (ref 11.2–14.5)
WBC: 8.5 10*3/uL (ref 3.9–10.3)
lymph#: 1.6 10*3/uL (ref 0.9–3.3)

## 2014-11-03 LAB — COMPREHENSIVE METABOLIC PANEL (CC13)
ALT: 21 U/L (ref 0–55)
ANION GAP: 10 meq/L (ref 3–11)
AST: 14 U/L (ref 5–34)
Albumin: 4 g/dL (ref 3.5–5.0)
Alkaline Phosphatase: 79 U/L (ref 40–150)
BILIRUBIN TOTAL: 0.25 mg/dL (ref 0.20–1.20)
BUN: 14.2 mg/dL (ref 7.0–26.0)
CO2: 25 meq/L (ref 22–29)
Calcium: 9.3 mg/dL (ref 8.4–10.4)
Chloride: 108 mEq/L (ref 98–109)
Creatinine: 0.8 mg/dL (ref 0.6–1.1)
Glucose: 114 mg/dl (ref 70–140)
Potassium: 3.9 mEq/L (ref 3.5–5.1)
SODIUM: 143 meq/L (ref 136–145)
TOTAL PROTEIN: 7.2 g/dL (ref 6.4–8.3)

## 2014-11-03 LAB — GLUCOSE, CAPILLARY: Glucose-Capillary: 121 mg/dL — ABNORMAL HIGH (ref 70–99)

## 2014-11-03 MED ORDER — FLUDEOXYGLUCOSE F - 18 (FDG) INJECTION
8.5200 | Freq: Once | INTRAVENOUS | Status: AC | PRN
  Administered 2014-11-03: 8.52 via INTRAVENOUS

## 2014-11-04 LAB — FOLLICLE STIMULATING HORMONE: FSH: 34.6 m[IU]/mL

## 2014-11-07 ENCOUNTER — Other Ambulatory Visit: Payer: Self-pay | Admitting: Oncology

## 2014-11-07 LAB — ESTRADIOL, ULTRA SENS: Estradiol, Ultra Sensitive: <2 pg/mL

## 2014-11-14 ENCOUNTER — Ambulatory Visit (HOSPITAL_BASED_OUTPATIENT_CLINIC_OR_DEPARTMENT_OTHER): Payer: 59 | Admitting: Oncology

## 2014-11-14 ENCOUNTER — Telehealth: Payer: Self-pay | Admitting: Oncology

## 2014-11-14 VITALS — BP 131/69 | HR 92 | Temp 97.8°F | Resp 19 | Ht 65.0 in | Wt 182.0 lb

## 2014-11-14 DIAGNOSIS — Z853 Personal history of malignant neoplasm of breast: Secondary | ICD-10-CM

## 2014-11-14 DIAGNOSIS — I890 Lymphedema, not elsewhere classified: Secondary | ICD-10-CM

## 2014-11-14 DIAGNOSIS — C50911 Malignant neoplasm of unspecified site of right female breast: Secondary | ICD-10-CM

## 2014-11-14 DIAGNOSIS — G4700 Insomnia, unspecified: Secondary | ICD-10-CM

## 2014-11-14 DIAGNOSIS — M8580 Other specified disorders of bone density and structure, unspecified site: Secondary | ICD-10-CM

## 2014-11-14 NOTE — Telephone Encounter (Signed)
, °

## 2014-11-14 NOTE — Progress Notes (Signed)
ID: Christina Blackwell   DOB: 1966-08-18  MR#: [id number]  NAD#:[id number]   PCP:  GYN:   SUR:  Almond Lint, MD RADONC:  Margaretmary Dys, MD OTHER:  Etter Sjogren, MD;   Celene Kras, MD (Plastic Surgery, [place]; [place] [phone]) Laren Everts, MD (Onc Surgery, [place], [place] # [phone]) Jackelyn Knife, MD ([facility])  CHIEF COMPLAINT:  Hx of Bilateral Breast Cancers  CURRENT THERAPY: Anti-estrogens    HISTORY OF PRESENT ILLNESS: From the original intake note:  Christina Blackwell is an [place] woman formerly followed here for a left-sided stage I invasive ductal carcinoma, which was treated with adjuvant chemotherapy completed in 2003. She was released from followup 2012.   On 12/28/2011 she had bilateral screening mammography showing a possible area of distortion in the right breast. She was recalled for additional views 01/09/2012 and this confirmed an area of architectural distortion in the lateral aspect of the right breast measuring 2.9 cm. This was not palpable by exam. Biopsy was performed the same day and showed ([id number]) and invasive lobular breast cancer, e-cadherin negative, strongly estrogen receptor positive at 85%, progesterone receptor positive at 98%, with an MIB-1 of 9%, and no HER-2 amplification.  Patient is status post right lumpectomy and x-ray lymph node dissection 03/02/2012 for a T2 N2 invasive lobular breast carcinoma, grade 1. 10 of 10 lymph nodes were positive. Margins were close but negative.   Her subsequent history is as detailed below  INTERVAL HISTORY: Christina Blackwell returns today for followup of her bilateral breast cancers. She went off tamoxifen in August. She did not notice a significant difference in symptoms. She started anastrozole 10/15/2014. She thinks perhaps she has fewer hot flashes on this medication. Her vaginal dryness is unchanged. She still has no libido. Overall though she feels she is tolerating anastrozole well and in particular no arthralgias or myalgias  have developed so far.  REVIEW OF SYSTEMS: Christina Blackwell feels better about her reconstructed right breast than before, but "it still not me". She was not able to exercise for a period of time so she gained more weight. She is having some insomnia, but otherwise a detailed review of systems today was remarkably stable.  PAST MEDICAL HISTORY: Past Medical History  Diagnosis Date  . History of bilateral breast implants   . GERD (gastroesophageal reflux disease)   . Complication of anesthesia   . PONV (postoperative nausea and vomiting)   . History of radiation therapy 03/30/02-05/16/02    left breast   . History of chemotherapy     cyclosphosphamide/ doxorubicin  . Breast cancer 2002    left   . Breast cancer 2012    right  . Anxiety   . Depression   . History of chemotherapy 4th:June 13,2013    taxol  . History of radiation therapy 06/22/12 -08/06/12    right breast  . Seroma 12/2012 u/s xray    right breast/aspiration 01/19/13    PAST SURGICAL HISTORY: Past Surgical History  Procedure Laterality Date  . Portacath placement    . Portacath removal    . Tonsillectomy and adenoidectomy    . Breast lumpectomy  11/2001    left  . Sentinel lymph node biopsy  11/2001    left axillary  . Breast enhancement surgery  2003    bilateral  . Breast lumpectomy w/ needle localization  02/05/2012    right/ slnbx  . Mirena iud      still in place as of 06/09/12  . Axillary lymph node dissection  03/02/2012  Procedure: AXILLARY LYMPH NODE DISSECTION;  Surgeon: Shann Medal, MD;  Location: [place];  Service: General;  Laterality: Right;    FAMILY HISTORY Family History  Problem Relation Age of Onset  . Cancer Mother     breast  . Cancer Paternal Aunt     breast  . Cancer Cousin     breast ca 75's 1st cousin    Gynecologic history:  Menarche age 89, she is GX P0. She is currently using a Mirena implant  and is considering bilateral salpingo-oophorectomy.  Social history:   (updated 03/22/2014)  She works for [employer] in [place]. She is divorced and lives by herself, with 2 birds, an Montegut and a cockateal. She is not a Ambulance person.   ADVANCED DIRECTIVES: in place   HEALTH MAINTENANCE:  (updated 03/22/2014) History  Substance Use Topics  . Smoking status: Never Smoker   . Smokeless tobacco: Never Used  . Alcohol Use: Yes     Comment: social occasio0nally 1-2      Colonoscopy: Never  PAP: Not on file  Bone density: Never  Lipid panel: Not on file    Allergies  Allergen Reactions  . Rocuronium Anaphylaxis    Current Outpatient Prescriptions  Medication Sig Dispense Refill  . anastrozole (ARIMIDEX) 1 MG tablet Take 1 tablet (1 mg total) by mouth daily. 90 tablet 3  . cetirizine (ZYRTEC) 10 MG tablet Take 10 mg by mouth as needed.    . gabapentin (NEURONTIN) 300 MG capsule TAKE 1 CAPSULE (300 MG TOTAL) BY MOUTH TWICE A DAY 60 capsule 6  . ibuprofen (ADVIL,MOTRIN) 200 MG tablet Take 200 mg by mouth every 6 (six) hours as needed.    Marland Kitchen LORazepam (ATIVAN) 0.5 MG tablet Take 1 tablet (0.5 mg total) by mouth 2 (two) times daily as needed for anxiety or sleep. 45 tablet 0  . Multiple Vitamin (MULTIVITAMIN) tablet Take 1 tablet by mouth daily.    Marland Kitchen omeprazole (PRILOSEC) 20 MG capsule Take 20 mg by mouth daily.     No current facility-administered medications for this visit.   Facility-Administered Medications Ordered in Other Visits  Medication Dose Route Frequency Provider Last Rate Last Dose  . topical emolient (BIAFINE) emulsion   Topical Daily Lora Paula, MD        OBJECTIVE: Middle-aged white woman who appears well  Filed Vitals:   11/14/14 1509  BP: 131/69  Pulse: 92  Temp: 97.8 F (36.6 C)  Resp: 19     Body mass index is 30.29 kg/(m^2).    ECOG FS: 1  Filed Weights   11/14/14 1509  Weight: 182 lb (82.555 kg)   Sclerae unicteric, pupils round and equal Oropharynx clear, dentition in good  repair No cervical or supraclavicular adenopathy Lungs no rales or rhonchi Heart regular rate and rhythm Abd soft, nontender, positive bowel sounds MSK no focal spinal tenderness, right upper extremity in compression sleeve, with grade 1 lymphedema Neuro: nonfocal, well oriented, appropriate affect Breasts: The right breast is status post mastectomy with TRAM reconstruction. There is no evidence of local recurrence. The right axilla is benign. The left breast is status post lumpectomy. There is no evidence of local recurrence. The left axilla is benign     LAB RESULTS: Lab Results  Component Value Date   WBC 8.5 11/03/2014   NEUTROABS 6.4 11/03/2014   HGB 9.2* 11/03/2014   HCT 31.5* 11/03/2014   MCV 69.7* 11/03/2014   PLT 292 11/03/2014  Chemistry      Component Value Date/Time   NA 143 11/03/2014 1012   NA 141 07/24/2014 0818   K 3.9 11/03/2014 1012   K 4.1 07/24/2014 0818   CL 106 07/24/2014 0818   CL 105 04/05/2013 1157   CO2 25 11/03/2014 1012   CO2 21 07/24/2014 0818   BUN 14.2 11/03/2014 1012   BUN 16 07/24/2014 0818   CREATININE 0.8 11/03/2014 1012   CREATININE 0.78 07/24/2014 0818      Component Value Date/Time   CALCIUM 9.3 11/03/2014 1012   CALCIUM 9.4 07/24/2014 0818   ALKPHOS 79 11/03/2014 1012   ALKPHOS 64 07/24/2014 0818   AST 14 11/03/2014 1012   AST 20 07/24/2014 0818   ALT 21 11/03/2014 1012   ALT 16 07/24/2014 0818   BILITOT 0.25 11/03/2014 1012   BILITOT 0.3 07/24/2014 0818       STUDIES: Nm Pet Image Restag (ps) Skull Base To Thigh  11/06/2014   CLINICAL DATA:  Subsequent treatment strategy for breast cancer.  EXAM: NUCLEAR MEDICINE PET SKULL BASE TO THIGH  TECHNIQUE: A 0.5 mCi F-18 FDG was injected intravenously. Full-ring PET imaging was performed from the skull base to thigh after the radiotracer. CT data was obtained and used for attenuation correction and anatomic localization.  FASTING BLOOD GLUCOSE:  Value: 121 mg/dl  COMPARISON:   10/04/2013  FINDINGS: [facility] would not push screen captures back to PACS.  NECK  5 mm short axis level 1 left-sided cervical lymph node is stable in size and appearance since the prior PET-CT. This is mildly hypermetabolic on today's study ( SUV max = 2.9. No other unexpected hypermetabolic uptake in the neck.  CHEST  There is some mild hypermetabolism associated with the soft tissue around the right axillary clips. SUV max = 2.8 No other unexpected hypermetabolism in the chest.  ABDOMEN/PELVIS  No abnormal hypermetabolic activity within the liver, pancreas, adrenal glands, or spleen. No hypermetabolic lymph nodes in the abdomen or pelvis.  Surgical clips are seen in the left groin region. No unexpected hypermetabolism.  SKELETON  No focal hypermetabolic activity to suggest skeletal metastasis.  IMPRESSION: Low low uptake is identified in a left cervical lymph node which is unchanged in size or appearance since 10/05/2013. Given the stability this lymph node in the low level activity common is most likely postinfectious or postinflammatory.  Borderline uptake identified in some a amorphous soft tissues associated with right axillary surgical clips. Again, this is probably related to uptake in scar.   Electronically Signed   By: Misty Stanley M.D.   On: 11/06/2014 09:26   Dg Bone Density  11/03/2014   CLINICAL DATA:  Postmenopausal. History of prednisone use. The patient takes Arimidex.  EXAM: DUAL X-RAY ABSORPTIOMETRY (DXA) FOR BONE MINERAL DENSITY  FINDINGS: AP LUMBAR SPINE L1-4  Bone Mineral Density (BMD):  0.780 g/cm2  Young Adult T-Score:  -2.4  Z-Score:  -1.7  Left FEMUR neck  Bone Mineral Density (BMD):  0.717 g/cm2  Young Adult T-Score: -1.2  Z-Score:  -0.5  ASSESSMENT: Patient's diagnostic category is low bone mass/osteopenia by WHO Criteria.  FRACTURE RISK: Increased  FRAX: Based on the [place] model, the 10 year probability of a major osteoporotic fracture is 4.0%. The 10  year probability of a hip fracture is 0.2%.  COMPARISON: None.  Effective therapies are available in the form of bisphosphonates, selective estrogen receptor modulators, biologic agents, and hormone replacement therapy (for women). All patients  should ensure an adequate intake of dietary calcium (1200 mg daily) and vitamin D (800 IU daily) unless contraindicated.  All treatment decisions require clinical judgment and consideration of individual patient factors, including patient preferences, co-morbidities, previous drug use, risk factors not captured in the FRAX model (e.g., frailty, falls, vitamin D deficiency, increased bone turnover, interval significant decline in bone density) and possible under- or over-estimation of fracture risk by FRAX.  The National Osteoporosis Foundation recommends that FDA-approved medical therapies be considered in postmenopausal women and men age 7 or older with a:  1. Hip or vertebral (clinical or morphometric) fracture.  2. T-score of -2.5 or lower at the spine or hip.  3. Ten-year fracture probability by FRAX of 3% or greater for hip fracture or 20% or greater for major osteoporotic fracture.  People with diagnosed cases of osteoporosis or at high risk for fracture should have regular bone mineral density tests. For patients eligible for Medicare, routine testing is allowed once every 2 years. The testing frequency can be increased to one year for patients who have rapidly progressing disease, those who are receiving or discontinuing medical therapy to restore bone mass, or have additional risk factors.  World Pharmacologist [facility]) Criteria:  Normal: T-scores from +1.0 to -1.0  Low Bone Mass (Osteopenia): T-scores between -1.0 and -2.5  Osteoporosis: T-scores -2.5 and below  Comparison to Reference Population:  T-score is the key measure used in the diagnosis of osteoporosis and relative risk determination for fracture. It provides a value for bone mass relative to the mean  bone mass of a young adult reference population expressed in terms of standard deviation (SD).  Z-score is the age-matched score showing the patient's values compared to a population matched for age, sex, and race. This is also expressed in terms of standard deviation. The patient may have values that compare favorably to the age-matched values and still be at increased risk for fracture.   Electronically Signed   By: Shon Hale M.D.   On: 11/03/2014 14:38      ASSESSMENT: 48 y.o.  BRCA 1-2 negative [place] woman:   (1) status post left lumpectomy and sentinel lymph node biopsy December 2002 for a 1.9 cm invasive ductal carcinoma, no lymph node involvement, triple negative, treated with 4 cycles of Cytoxan and Adriamycin followed by radiation, with no evidence of disease recurrence to date.  (2) status post Right lumpectomy and axillary lymph node dissection 03/02/2012 for a T2 N2 invasive lobular breast cancer, grade 1, estrogen receptor 85% and progesterone receptor 98% positive, with HER-2 nonamplified and an MIB-1 of 9%; 10 of 10 lymph nodes involved. (3)  completed 4 cycles of docetaxel/cyclophosphamide 05/27/2012 (4)  Status post radiation therapy,completed Blackwell August 2013 (5)  she started on tamoxifen August 2013, switched to anastrozole 09/14/2014 (6)  lymphedema, right upper extremity (7) seroma cavity Right breast, resolved (8)  status post surgery at [facility] in February 2015 consisting of a right mastectomy with TRAM reconstruction, and lymph node transfer (from left groin to right axilla) (9) osteopenia with a T score of -2.4 on bone density scan 11/03/2014   PLAN: We discussed her PET scan results which are very favorable. The left cervical lymph node is not palpable, has not changed in a year, and has minimal SUV uptake. That is reassuring. The bone density shows significant osteopenia and we discussed the importance vitamin D supplementation and weightbearing  exercise.  Otherwise she is making a successful transition to anastrozole and the  plan will be to continue that at least 3 additional years. Christina Blackwell has a good understanding of the overall plan, and agrees with it. She will call with any problems that may develop before her next visit here, which will be May 2016.   Chauncey Cruel, MD     11/14/2014

## 2014-11-15 NOTE — Addendum Note (Signed)
Addended by: Laureen Abrahams on: 11/15/2014 06:29 PM   Modules accepted: Medications

## 2014-12-20 ENCOUNTER — Other Ambulatory Visit: Payer: Self-pay | Admitting: *Deleted

## 2014-12-20 DIAGNOSIS — C50919 Malignant neoplasm of unspecified site of unspecified female breast: Secondary | ICD-10-CM

## 2014-12-20 DIAGNOSIS — I890 Lymphedema, not elsewhere classified: Secondary | ICD-10-CM

## 2014-12-21 ENCOUNTER — Other Ambulatory Visit: Payer: Self-pay

## 2014-12-21 DIAGNOSIS — Z1231 Encounter for screening mammogram for malignant neoplasm of breast: Secondary | ICD-10-CM

## 2015-01-02 ENCOUNTER — Ambulatory Visit: Payer: 59 | Attending: Oncology | Admitting: Physical Therapy

## 2015-01-02 DIAGNOSIS — I890 Lymphedema, not elsewhere classified: Secondary | ICD-10-CM

## 2015-01-02 NOTE — Therapy (Signed)
[place], [place], [address] Phone: [phone]   Fax:  [phone]  Physical Therapy Evaluation  Patient Details  Name: Christina Blackwell MRN: [id number] Date of Birth: [birth year] Referring Provider:  Chauncey Cruel, MD  Encounter Date: 01/02/2015      PT End of Session - 01/02/15 1756    Visit Number 1   Number of Visits 13   Date for PT Re-Evaluation 02/12/15   PT Start Time 0938   PT Stop Time 1650   PT Time Calculation (min) 35 min      Past Medical History  Diagnosis Date  . History of bilateral breast implants   . GERD (gastroesophageal reflux disease)   . Complication of anesthesia   . PONV (postoperative nausea and vomiting)   . History of radiation therapy 03/30/02-05/16/02    left breast   . History of chemotherapy     cyclosphosphamide/ doxorubicin  . Breast cancer 2002    left   . Breast cancer 2012    right  . Anxiety   . Depression   . History of chemotherapy 4th:June 13,2013    taxol  . History of radiation therapy 06/22/12 -08/06/12    right breast  . Seroma 12/2012 u/s xray    right breast/aspiration 01/19/13    Past Surgical History  Procedure Laterality Date  . Portacath placement    . Portacath removal    . Tonsillectomy and adenoidectomy    . Breast lumpectomy  11/2001    left  . Sentinel lymph node biopsy  11/2001    left axillary  . Breast enhancement surgery  2003    bilateral  . Breast lumpectomy w/ needle localization  02/05/2012    right/ slnbx  . Mirena iud      still in place as of 06/09/12  . Axillary lymph node dissection  03/02/2012    Procedure: AXILLARY LYMPH NODE DISSECTION;  Surgeon: Shann Medal, MD;  Location: [facility];  Service: General;  Laterality: Right;    There were no vitals taken for this visit.  Visit Diagnosis:  Lymphedema of upper extremity - Plan: PT plan of care cert/re-cert      Subjective Assessment -  01/02/15 1617    Symptoms Arm feels bigger at elbow.   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  Initially had a lumpectomy in February 1829 but due to complications had a mastectomy with TRAM flap later (February 2015).  Had a surgery prior to lymph node transfer in forearm to help alleviate swelling; that helped hand not swell for six months after surgery in December 2014.                                                  Patient Stated Goals Wants [place] to look at arm and see how it looks with current edema.   Currently in Pain? No/denies          [facility] PT Assessment - 01/02/15 0001    Assessment   Medical Diagnosis lymphedema s/p breast cancer   Precautions   Precautions Other (comment)   Precaution Comments cancer precautions   Restrictions   Weight Bearing Restrictions No   Balance Screen   Has the patient fallen in the past  6 months No   Has the patient had a decrease in activity level because of a fear of falling?  No   Is the patient reluctant to leave their home because of a fear of falling?  No   Prior Function   Level of Independence Independent with basic ADLs;Independent with homemaking with ambulation;Independent with gait   Observation/Other Assessments   Other Surveys  Select  Quick Dash = 9   AROM   Overall AROM  Other (comment)  Not tested, but patient reports her function is good.           LYMPHEDEMA/ONCOLOGY QUESTIONNAIRE - 01/02/15 1630    What other symptoms do you have   Are you having pitting edema Yes   Lymphedema Stage   Stage STAGE 2 SPONTANEOUSLY IRREVERSIBLE   Lymphedema Assessments   Lymphedema Assessments Upper extremities   Right Upper Extremity Lymphedema   15 cm Proximal to Olecranon Process 35 cm   10 cm Proximal to Olecranon Process 32.9 cm   Olecranon Process 18 cm   15 cm Proximal to Ulnar Styloid Process 29.6 cm   10 cm Proximal to Ulnar Styloid Process 27.2 cm   Just Proximal to  Ulnar Styloid Process 16.9 cm   Across Hand at [employer] 18.7 cm   At [place] of 2nd Digit 6 cm   Left Upper Extremity Lymphedema   15 cm Proximal to Olecranon Process 31.9 cm   10 cm Proximal to Olecranon Process 29.6 cm   Olecranon Process 26.2 cm   15 cm Proximal to Ulnar Styloid Process 25.5 cm   10 cm Proximal to Ulnar Styloid Process 23.2 cm   Just Proximal to Ulnar Styloid Process 15.5 cm   Across Hand at [employer] 18.7 cm   At [place] of 2nd Digit 6.1 cm           Quick Dash - 01/02/15 0001    Open a tight or new jar Mild difficulty   Do heavy household chores (wash walls, wash floors) Mild difficulty   Carry a shopping bag or briefcase No difficulty   Wash your back No difficulty   Use a knife to cut food No difficulty   Recreational activities in which you take some force or impact through your arm, shoulder, or hand (golf, hammering, tennis) Mild difficulty   During the past week, to what extent has your arm, shoulder or hand problem interfered with your normal social activities with family, friends, neighbors, or groups? Not at all   During the past week, to what extent has your arm, shoulder or hand problem limited your work or other regular daily activities Not at all   Arm, shoulder, or hand pain. Mild   Difficulty Sleeping Mild difficulty   DASH Score 9.09 %                       Short Term Clinic Goals - 01/02/15 1801    CC Short Term Goal  #1   Title Pt. will have a reduction of at least 1 cm. in circumference at 15 cm. proximal to ulnar styloid of right arm.   Baseline 29.6 cm.   Time 3   Period Weeks   Status New             Long Term Clinic Goals - 01/02/15 1802    CC Long Term Goal  #1   Title Pt. will have reduction of at least 2 cm.  in circumference of right arm at 15 cm. proximal to ulanr styoid.   Baseline 29.6 cm.   Time 6   Period Weeks   Status New   CC Long Term Goal  #2   Title Pt. will have been set up for  measurement of new compression garments for daytime use.   Time 6   Period Weeks   Status New   CC Long Term Goal  #3   Title Pt. will be independent in performing self-manual lymph drainage correctly.   Time 6   Period Weeks   Status New            Plan - 01/02/15 1756    Clinical Impression Statement Patient who has been treated at this clinic in the past (a couple of years ago) returns now after lymph node transplant and other treatments, reporting increased swelling, particularly around her elbow, in right UE.  She would like assistance with getting new compression garments but also wants to have some review of self-manual lymph drainage and bandaging to reduce swelling.  Patient does have a nighttime compression garment (Jovi pak), which she uses regularly.   Pt will benefit from skilled therapeutic intervention in order to improve on the following deficits Increased edema;Other (comment)  need for new daytime compression garments   Rehab Potential Good   PT Frequency 2x / week  Patient is unable to come 3x/week for complete decongestive therapy, but does know how to bandage herself and can do this in between therapy sessions   PT Duration 6 weeks   PT Treatment/Interventions Manual lymph drainage;Compression bandaging;Patient/family education   PT Next Visit Plan Begin complete decongestive therapy to right UE.   Recommended Other Services Patient did talk to Rexford Maus today about getting new daytime garments and will work with Hoyle Sauer on this.   Consulted and Agree with Plan of Care Patient         Problem List Patient Active Problem List   Diagnosis Date Noted  . 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 Hot flashes due to tamoxifen 07/12/2013  . 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 Seroma 01/19/2013  . Anxiety 01/10/2013  . History of radiation therapy   . History of breast cancer, left, T1, N0, December 2002. 02/12/2012    SALIS[place],DONNA 01/02/2015, 6:06  PM  [place], [place], [address] Phone: [phone]   Fax:  [phone]  Serafina Royals, [place]

## 2015-01-09 ENCOUNTER — Ambulatory Visit: Payer: 59 | Admitting: Physical Therapy

## 2015-01-09 DIAGNOSIS — I890 Lymphedema, not elsewhere classified: Secondary | ICD-10-CM | POA: Diagnosis not present

## 2015-01-09 NOTE — Therapy (Signed)
[place], [place], [address] Phone: [phone]   Fax:  [phone]  Physical Therapy Treatment  Patient Details  Name: Christina Blackwell MRN: [id number] Date of Birth: 01-07-1966 Referring Provider:  Chauncey Cruel, MD  Encounter Date: 01/09/2015      PT End of Session - 01/09/15 1657    Visit Number 2   Number of Visits 13   Date for PT Re-Evaluation 02/12/15   PT Start Time 0109   PT Stop Time 1649   PT Time Calculation (min) 43 min      Past Medical History  Diagnosis Date  . History of bilateral breast implants   . GERD (gastroesophageal reflux disease)   . Complication of anesthesia   . PONV (postoperative nausea and vomiting)   . History of radiation therapy 03/30/02-05/16/02    left breast   . History of chemotherapy     cyclosphosphamide/ doxorubicin  . Breast cancer 2002    left   . Breast cancer 2012    right  . Anxiety   . Depression   . History of chemotherapy 4th:June 13,2013    taxol  . History of radiation therapy 06/22/12 -08/06/12    right breast  . Seroma 12/2012 u/s xray    right breast/aspiration 01/19/13    Past Surgical History  Procedure Laterality Date  . Portacath placement    . Portacath removal    . Tonsillectomy and adenoidectomy    . Breast lumpectomy  11/2001    left  . Sentinel lymph node biopsy  11/2001    left axillary  . Breast enhancement surgery  2003    bilateral  . Breast lumpectomy w/ needle localization  02/05/2012    right/ slnbx  . Mirena iud      still in place as of 06/09/12  . Axillary lymph node dissection  03/02/2012    Procedure: AXILLARY LYMPH NODE DISSECTION;  Surgeon: Shann Medal, MD;  Location: [facility];  Service: General;  Laterality: Right;    There were no vitals taken for this visit.  Visit Diagnosis:  Lymphedema of upper extremity      Subjective Assessment - 01/09/15 1608    Symptoms Wore custom  sleeve today even though I hate it.     Currently in Pain? No/denies                    [facility] Adult PT Treatment/Exercise - 01/09/15 0001    Manual Therapy   Manual Therapy Edema management;Manual Lymphatic Drainage (MLD);Compression Bandaging   Manual Lymphatic Drainage (MLD) In supine, short neck, left axilla and anterior interaxillary anastomosis, and right UE from fingers to shoulder.   Compression Bandaging Lotion applied, then bandaging of right UE with stockinette, Elastomull on all five fingers, Artiflex x 1, and 1-6 cm., 1-10 cm., and 1-12 cm. short stretch bandage from hand to shoulder.                PT Education - 01/09/15 1657    Education provided Yes   Education Details Reminded patient about doing AROM with right UE while in bandages as well as to remove bandages if pain occurs that is unrelieved with exercise.  Also, as patient knows how to bandage, she is to bandage in a couple days, since she won't be back until next week.   Person(s) Educated Patient   Methods Explanation   Comprehension Verbalized understanding  Short Term Clinic Goals - 01/02/15 1801    CC Short Term Goal  #1   Title Pt. will have a reduction of at least 1 cm. in circumference at 15 cm. proximal to ulnar styloid of right arm.   Baseline 29.6 cm.   Time 3   Period Weeks   Status New             Long Term Clinic Goals - 01/02/15 1802    CC Long Term Goal  #1   Title Pt. will have reduction of at least 2 cm. in circumference of right arm at 15 cm. proximal to ulanr styoid.   Baseline 29.6 cm.   Time 6   Period Weeks   Status New   CC Long Term Goal  #2   Title Pt. will have been set up for measurement of new compression garments for daytime use.   Time 6   Period Weeks   Status New   CC Long Term Goal  #3   Title Pt. will be independent in performing self-manual lymph drainage correctly.   Time 6   Period Weeks   Status New            Plan  - 01/09/15 1659    Clinical Impression Statement Did well with treatment today; encouraged patient to pay attention to both manual lymph drainage and bandaging to refresh her memory of these, since she has learned them in the past.   Rehab Potential Good   PT Frequency 2x / week   PT Duration 6 weeks   PT Treatment/Interventions Manual lymph drainage;Compression bandaging;Patient/family education   PT Next Visit Plan Remeasure.  Continue complete decongestive therapy.   PT Home Exercise Plan right UE AROM   Consulted and Agree with Plan of Care Patient        Problem List Patient Active Problem List   Diagnosis Date Noted  . 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 Hot flashes due to tamoxifen 07/12/2013  . 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 Seroma 01/19/2013  . Anxiety 01/10/2013  . History of radiation therapy   . History of breast cancer, left, T1, N0, December 2002. 02/12/2012    Christina Blackwell 01/09/2015, 5:01 PM  [place], [place], [address] Phone: [phone]   Fax:  [phone]  Christina Blackwell, [place]

## 2015-01-16 ENCOUNTER — Ambulatory Visit: Payer: 59 | Attending: Oncology

## 2015-01-16 DIAGNOSIS — I890 Lymphedema, not elsewhere classified: Secondary | ICD-10-CM | POA: Diagnosis present

## 2015-01-17 NOTE — Therapy (Signed)
[place], [place], [address] Phone: [phone]   Fax:  [phone]  Physical Therapy Treatment  Patient Details  Name: Christina Blackwell MRN: [id number] Date of Birth: September 08, 1966 Referring Provider:  Chauncey Cruel, MD  Encounter Date: 01/16/2015      PT End of Session - 01/17/15 1127    Visit Number 3   Number of Visits 13   Date for PT Re-Evaluation 02/12/15   PT Start Time 8144   PT Stop Time 1701   PT Time Calculation (min) 55 min      Past Medical History  Diagnosis Date  . History of bilateral breast implants   . GERD (gastroesophageal reflux disease)   . Complication of anesthesia   . PONV (postoperative nausea and vomiting)   . History of radiation therapy 03/30/02-05/16/02    left breast   . History of chemotherapy     cyclosphosphamide/ doxorubicin  . Breast cancer 2002    left   . Breast cancer 2012    right  . Anxiety   . Depression   . History of chemotherapy 4th:June 13,2013    taxol  . History of radiation therapy 06/22/12 -08/06/12    right breast  . Seroma 12/2012 u/s xray    right breast/aspiration 01/19/13    Past Surgical History  Procedure Laterality Date  . Portacath placement    . Portacath removal    . Tonsillectomy and adenoidectomy    . Breast lumpectomy  11/2001    left  . Sentinel lymph node biopsy  11/2001    left axillary  . Breast enhancement surgery  2003    bilateral  . Breast lumpectomy w/ needle localization  02/05/2012    right/ slnbx  . Mirena iud      still in place as of 06/09/12  . Axillary lymph node dissection  03/02/2012    Procedure: AXILLARY LYMPH NODE DISSECTION;  Surgeon: Shann Medal, MD;  Location: [facility];  Service: General;  Laterality: Right;    There were no vitals taken for this visit.  Visit Diagnosis:  Lymphedema of upper extremity      Subjective Assessment - 01/16/15 1614    Symptoms Bandages  stayed on until Friday. Tried to rewrap but it was too tight.             LYMPHEDEMA/ONCOLOGY QUESTIONNAIRE - 01/16/15 1620    Right Upper Extremity Lymphedema   15 cm Proximal to Olecranon Process 33.4 cm   10 cm Proximal to Olecranon Process 32.1 cm   Olecranon Process 17.8 cm   15 cm Proximal to Ulnar Styloid Process 29.1 cm   10 cm Proximal to Ulnar Styloid Process 26.8 cm   Just Proximal to Ulnar Styloid Process 15.7 cm   Across Hand at [employer] 19.8 cm   At [place] of 2nd Digit 5.9 cm               [facility] Adult PT Treatment/Exercise - 01/16/15 1116    Manual Therapy   Manual Lymphatic Drainage (MLD) In Supine: Short neck, Lt axilla nodes, anterior inter-axillary anatomosis and Rt UE from dorsal hand to lateral shoulder.    Compression Bandaging Biotone lotion applied, stockinette, Elastomull to all 5 fingers with peach Medi foam with small dots to dorsal hand, Artiflex x1, and 3 short stretch compression bandages from hand to axilla.  PT Education - 01/17/15 1131    Education provided Yes   Education Details Issued DVD on self manual lymph drainage; instructed in bandaging while PTA performed it today   Person(s) Educated Patient   Methods Explanation;Demonstration   Comprehension Verbalized understanding           Short Term Clinic Goals - 01/02/15 1801    CC Short Term Goal  #1   Title Pt. will have a reduction of at least 1 cm. in circumference at 15 cm. proximal to ulnar styloid of right arm.   Baseline 29.6 cm.   Time 3   Period Weeks   Status New             Long Term Clinic Goals - 01/02/15 1802    CC Long Term Goal  #1   Title Pt. will have reduction of at least 2 cm. in circumference of right arm at 15 cm. proximal to ulanr styoid.   Baseline 29.6 cm.   Time 6   Period Weeks   Status New   CC Long Term Goal  #2   Title Pt. will have been set up for measurement of new compression garments for daytime use.    Time 6   Period Weeks   Status New   CC Long Term Goal  #3   Title Pt. will be independent in performing self-manual lymph drainage correctly.   Time 6   Period Weeks   Status New            Plan - 01/17/15 1128    Clinical Impression Statement Pt continues to tolerate treatment well; she has attempted bandaging herself since last treatment and felt she did hand too tight so seemed to have good understanding of better technique after I instructed her while performing today. Overall measurements were reduced except for hand circumference was up.   Pt will benefit from skilled therapeutic intervention in order to improve on the following deficits Increased edema;Other (comment)   Rehab Potential Good   PT Frequency 2x / week  Coming 1x/wk per pt request   PT Duration 6 weeks   PT Treatment/Interventions Manual lymph drainage;Compression bandaging;Patient/family education   PT Next Visit Plan Remeasure.  Continue complete decongestive therapy.   PT Home Exercise Plan right UE AROM, self bandaging, issued DVD for pt to watch on self manual lymph drainage   Consulted and Agree with Plan of Care Patient        Problem List Patient Active Problem List   Diagnosis Date Noted  . 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 Hot flashes due to tamoxifen 07/12/2013  . 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 Seroma 01/19/2013  . Anxiety 01/10/2013  . History of radiation therapy   . History of breast cancer, left, T1, N0, December 2002. 02/12/2012    Otelia Limes, PTA 01/17/2015, 11:32 AM  [place], [place], [address] Phone: [phone]   Fax:  [phone]

## 2015-01-23 ENCOUNTER — Ambulatory Visit: Payer: 59 | Admitting: Physical Therapy

## 2015-01-23 DIAGNOSIS — I890 Lymphedema, not elsewhere classified: Secondary | ICD-10-CM

## 2015-01-23 NOTE — Therapy (Signed)
[place], [place], [address] Phone: [phone]   Fax:  [phone]  Physical Therapy Treatment  Patient Details  Name: Christina Blackwell MRN: [id number] Date of Birth: Aug 12, 1966 Referring Provider:  Chauncey Cruel, MD  Encounter Date: 01/23/2015      PT End of Session - 01/23/15 1705    Visit Number 4   Number of Visits 13   Date for PT Re-Evaluation 02/12/15   PT Start Time 8563   PT Stop Time 1700   PT Time Calculation (min) 50 min      Past Medical History  Diagnosis Date  . History of bilateral breast implants   . GERD (gastroesophageal reflux disease)   . Complication of anesthesia   . PONV (postoperative nausea and vomiting)   . History of radiation therapy 03/30/02-05/16/02    left breast   . History of chemotherapy     cyclosphosphamide/ doxorubicin  . Breast cancer 2002    left   . Breast cancer 2012    right  . Anxiety   . Depression   . History of chemotherapy 4th:June 13,2013    taxol  . History of radiation therapy 06/22/12 -08/06/12    right breast  . Seroma 12/2012 u/s xray    right breast/aspiration 01/19/13    Past Surgical History  Procedure Laterality Date  . Portacath placement    . Portacath removal    . Tonsillectomy and adenoidectomy    . Breast lumpectomy  11/2001    left  . Sentinel lymph node biopsy  11/2001    left axillary  . Breast enhancement surgery  2003    bilateral  . Breast lumpectomy w/ needle localization  02/05/2012    right/ slnbx  . Mirena iud      still in place as of 06/09/12  . Axillary lymph node dissection  03/02/2012    Procedure: AXILLARY LYMPH NODE DISSECTION;  Surgeon: Shann Medal, MD;  Location: [facility];  Service: General;  Laterality: Right;    There were no vitals taken for this visit.  Visit Diagnosis:  Lymphedema of upper extremity      Subjective Assessment - 01/23/15 1613    Symptoms Pt. tried to  rewrap arm and also saw a massage therapist who did some manual lymph drainage.   Pertinent History Pt. reported today that she is pretty sure she did NOT have lymph nodes removed from right groin but only from left, that she has a smaller scar on right from drains and a larger scar on left from where nodes were removed.                         Currently in Pain? No/denies             LYMPHEDEMA/ONCOLOGY QUESTIONNAIRE - 01/23/15 1616    Right Upper Extremity Lymphedema   15 cm Proximal to Olecranon Process 34.3 cm   10 cm Proximal to Olecranon Process 32.4 cm   Olecranon Process 27.7 cm   15 cm Proximal to Ulnar Styloid Process 29.2 cm   10 cm Proximal to Ulnar Styloid Process 26.9 cm   Just Proximal to Ulnar Styloid Process 16.5 cm   Across Hand at [employer] 18.4 cm   At [place] of 2nd Digit 6 cm               [facility] Adult PT Treatment/Exercise - 01/23/15  0001    Manual Therapy   Manual Lymphatic Drainage (MLD) In supine, short neck, left axilla and anterior interaxillary anastomosis, right groin and axillo-inguinal anastomosis, and right UE from fingers to shoulder.   Compression Bandaging Lotion applied, then bandaging of right UE with stockinette, Elastomull on all five fingers, peach Medi small dotted foam on dorsal hand,  Artiflex x 1, and 1-6 cm., 1-10 cm., and 1-12 cm. short stretch bandage from hand to shoulder.                   Short Term Clinic Goals - 01/23/15 1708    CC Short Term Goal  #1   Status On-going             Long Term Clinic Goals - 01/23/15 1708    CC Long Term Goal  #2   Status On-going            Plan - 01/23/15 1705    Clinical Impression Statement Measurements today show overall 0.3-0.5 cm. reductions in circumference measurements of right UE compared to first measurements at time of evaluation.   Pt will benefit from skilled therapeutic intervention in order to improve on the following deficits Increased edema    Rehab Potential Good   PT Frequency Other (comment)  Pt. has only been able to come 1x/week, though original plan was 2x/week   PT Duration 6 weeks   PT Treatment/Interventions Manual lymph drainage;Compression bandaging;Patient/family education   PT Next Visit Plan Remeasure.  Continue complete decongestive therapy.   Consulted and Agree with Plan of Care Patient        Problem List Patient Active Problem List   Diagnosis Date Noted  . 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 Hot flashes due to tamoxifen 07/12/2013  . 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 Seroma 01/19/2013  . Anxiety 01/10/2013  . History of radiation therapy   . History of breast cancer, left, T1, N0, December 2002. 02/12/2012    Jadwiga Faidley 01/23/2015, 5:09 PM  [place], [place], [address] Phone: [phone]   Fax:  [phone]  Serafina Royals, [place]

## 2015-01-29 ENCOUNTER — Ambulatory Visit: Payer: Self-pay

## 2015-01-29 ENCOUNTER — Ambulatory Visit: Payer: 59 | Admitting: Physical Therapy

## 2015-02-05 ENCOUNTER — Other Ambulatory Visit: Payer: Self-pay | Admitting: Oncology

## 2015-02-06 ENCOUNTER — Ambulatory Visit: Payer: 59 | Admitting: Physical Therapy

## 2015-02-06 DIAGNOSIS — I890 Lymphedema, not elsewhere classified: Secondary | ICD-10-CM | POA: Diagnosis not present

## 2015-02-06 NOTE — Therapy (Signed)
[place], [place], [address] Phone: [phone]   Fax:  [phone]  Physical Therapy Treatment  Patient Details  Name: Christina Blackwell MRN: [id number] Date of Birth: [birth year] Referring Provider:  Chauncey Cruel, MD  Encounter Date: 02/06/2015      PT End of Session - 02/06/15 1713    Visit Number 5   Number of Visits 13   Date for PT Re-Evaluation 03/23/15   PT Start Time 8786   PT Stop Time 1656   PT Time Calculation (min) 46 min   Activity Tolerance Patient tolerated treatment well   Behavior During Therapy [facility] for tasks assessed/performed      Past Medical History  Diagnosis Date  . History of bilateral breast implants   . GERD (gastroesophageal reflux disease)   . Complication of anesthesia   . PONV (postoperative nausea and vomiting)   . History of radiation therapy 03/30/02-05/16/02    left breast   . History of chemotherapy     cyclosphosphamide/ doxorubicin  . Breast cancer 2002    left   . Breast cancer 2012    right  . Anxiety   . Depression   . History of chemotherapy 4th:June 13,2013    taxol  . History of radiation therapy 06/22/12 -08/06/12    right breast  . Seroma 12/2012 u/s xray    right breast/aspiration 01/19/13    Past Surgical History  Procedure Laterality Date  . Portacath placement    . Portacath removal    . Tonsillectomy and adenoidectomy    . Breast lumpectomy  11/2001    left  . Sentinel lymph node biopsy  11/2001    left axillary  . Breast enhancement surgery  2003    bilateral  . Breast lumpectomy w/ needle localization  02/05/2012    right/ slnbx  . Mirena iud      still in place as of 06/09/12  . Axillary lymph node dissection  03/02/2012    Procedure: AXILLARY LYMPH NODE DISSECTION;  Surgeon: Shann Medal, MD;  Location: [facility];  Service: General;  Laterality: Right;    There were no vitals taken for this  visit.  Visit Diagnosis:  Lymphedema of upper extremity      Subjective Assessment - 02/06/15 1610    Symptoms The snow messed up having my appointment last week.  Haven't wrapped much this week.   Currently in Pain? Yes   Pain Score 3    Pain Location Axilla   Pain Orientation Right   Aggravating Factors  sleeping on right side   Pain Relieving Factors as the day goes on; moving around             LYMPHEDEMA/ONCOLOGY QUESTIONNAIRE - 02/06/15 1614    Right Upper Extremity Lymphedema   15 cm Proximal to Olecranon Process 34.3 cm   10 cm Proximal to Olecranon Process 33 cm   Olecranon Process 28.1 cm   15 cm Proximal to Ulnar Styloid Process 29.3 cm   10 cm Proximal to Ulnar Styloid Process 27.1 cm   Just Proximal to Ulnar Styloid Process 16.1 cm   Across Hand at [employer] 18.1 cm   At [place] of 2nd Digit 5.7 cm               [facility] Adult PT Treatment/Exercise - 02/06/15 0001    Manual Therapy   Manual Therapy Edema management   Edema Management Circumference  measurements taken   Manual Lymphatic Drainage (MLD) In supine, short neck, left axilla and anterior interaxillary anastomosis, right groin and axillo-inguinal anastomosis, and right UE from fingers to shoulder.   Compression Bandaging Lotion applied to right UE, then bandaging with stockinette, Elastomull on all five fingers, peach small dot Medi foam on dorsal hand, Artiflex x 1, and 3 short stretch bandages from hand to shoulder on right UE.                   Short Term Clinic Goals - 01/23/15 1708    CC Short Term Goal  #1   Status On-going             Long Term Clinic Goals - 01/23/15 1708    CC Long Term Goal  #2   Status On-going            Plan - 02/06/15 1714    Clinical Impression Statement Patient with small changes in right UE circumferences; she has not been able to come more than once a week to therapy, but missed last week due to weather.  She knows how to bandage  herself, but hasn't done it in the past week, so progress is limited by infrequent therapy sessions and bandaging.   Pt will benefit from skilled therapeutic intervention in order to improve on the following deficits Increased edema   Rehab Potential Good   PT Frequency 2x / week   PT Duration 6 weeks   PT Treatment/Interventions Manual lymph drainage;Compression bandaging;Patient/family education   PT Next Visit Plan Continue complete decongestive therapy.   PT Home Exercise Plan Pt. is to work on bandaging her arm every couple of days in an effort to make more progress with reducing circumferences.   Consulted and Agree with Plan of Care Patient     Emphasized with patient today the need to be more consistent about self-bandaging every couple of days, since she can't come in to therapy more than once a week, if we want to see consistent and significant reduction in measurements.  Pt. Verbalized understanding.      Problem List Patient Active Problem List   Diagnosis Date Noted  . 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 Hot flashes due to tamoxifen 07/12/2013  . 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 Seroma 01/19/2013  . Anxiety 01/10/2013  . History of radiation therapy   . History of breast cancer, left, T1, N0, December 2002. 02/12/2012    Terre Zabriskie 02/06/2015, 5:23 PM  [place], [place], [address] Phone: [phone]   Fax:  [phone]   Serafina Royals, [place]

## 2015-02-12 ENCOUNTER — Ambulatory Visit: Payer: 59 | Admitting: Physical Therapy

## 2015-02-12 DIAGNOSIS — I890 Lymphedema, not elsewhere classified: Secondary | ICD-10-CM

## 2015-02-12 NOTE — Therapy (Signed)
[place], [place], [address] Phone: [phone]   Fax:  [phone]  Physical Therapy Treatment  Patient Details  Name: Christina Blackwell MRN: [id number] Date of Birth: 08-02-1966 Referring Provider:  Chauncey Cruel, MD  Encounter Date: 02/12/2015      PT End of Session - 02/12/15 1728    Visit Number 6   Number of Visits 13   Date for PT Re-Evaluation 03/23/15   PT Start Time 7654   PT Stop Time 1700   PT Time Calculation (min) 55 min      Past Medical History  Diagnosis Date  . History of bilateral breast implants   . GERD (gastroesophageal reflux disease)   . Complication of anesthesia   . PONV (postoperative nausea and vomiting)   . History of radiation therapy 03/30/02-05/16/02    left breast   . History of chemotherapy     cyclosphosphamide/ doxorubicin  . Breast cancer 2002    left   . Breast cancer 2012    right  . Anxiety   . Depression   . History of chemotherapy 4th:June 13,2013    taxol  . History of radiation therapy 06/22/12 -08/06/12    right breast  . Seroma 12/2012 u/s xray    right breast/aspiration 01/19/13    Past Surgical History  Procedure Laterality Date  . Portacath placement    . Portacath removal    . Tonsillectomy and adenoidectomy    . Breast lumpectomy  11/2001    left  . Sentinel lymph node biopsy  11/2001    left axillary  . Breast enhancement surgery  2003    bilateral  . Breast lumpectomy w/ needle localization  02/05/2012    right/ slnbx  . Mirena iud      still in place as of 06/09/12  . Axillary lymph node dissection  03/02/2012    Procedure: AXILLARY LYMPH NODE DISSECTION;  Surgeon: Shann Medal, MD;  Location: [facility];  Service: General;  Laterality: Right;    There were no vitals taken for this visit.  Visit Diagnosis:  Lymphedema of upper extremity      Subjective Assessment - 02/12/15 1714    Symptoms "Its not  going down "  Pt concerned about edema in her forearm and elbow   Currently in Pain? No/denies                    [facility] Adult PT Treatment/Exercise - 02/12/15 0001    Neck Exercises: Seated   Cervical Rotation Both   Lateral Flexion Both   Shoulder Rolls Backwards;5 reps   Other Seated Exercise upper thoracic extension   Manual Therapy   Manual Therapy Edema management   Manual Lymphatic Drainage (MLD) In supine, short neck, left axilla and anterior interaxillary anastomosis, right groin and axillo-inguinal anastomosis, and right UE from fingers to shoulder. in left sidelying for posterior anastamosis    Compression Bandaging Biotone applied to right UE, then bandaging with thick stockinette, Elastomull on all five fingers, peach small dot Medi foam on dorsal hand,large dot medi foam on forearm,  Artiflex x 2, and 3 short stretch bandages from hand to shoulder on right UE.                PT Education - 02/12/15 1728    Education provided Yes   Education Details weblinks for lymphatic yoga series and [email or web address] lymphedema education session   Person(s)  Educated Patient   Methods Explanation;Handout   Comprehension Verbalized understanding           Short Term Clinic Goals - 02/12/15 1731    CC Short Term Goal  #1   Title Pt. will have a reduction of at least 1 cm. in circumference at 15 cm. proximal to ulnar styloid of right arm.   Status On-going             Long Term Clinic Goals - 02/12/15 1732    CC Long Term Goal  #1   Title Pt. will have reduction of at least 2 cm. in circumference of right arm at 15 cm. proximal to ulanr styoid.   Status On-going   CC Long Term Goal  #2   Title Pt. will have been set up for measurement of new compression garments for daytime use.   Status On-going   CC Long Term Goal  #3   Title Pt. will be independent in performing self-manual lymph drainage correctly.   Status On-going            Plan -  02/12/15 1729    Clinical Impression Statement pt has not heard from [place] re: pricing on another sleeve for day and night. Began intoduction to klosetraining strength ABC and patient is interested. She tolerated treatment today well, but continues to be frustrated wtih slow progress in reduction of edema   PT Next Visit Plan Continue complete decongestive therapy. emphasize exercise component  Begin with teaching stretches and core exercises  ??? Flexitouch trial???        Problem List Patient Active Problem List   Diagnosis Date Noted  . 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 Hot flashes due to tamoxifen 07/12/2013  . 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 Seroma 01/19/2013  . Anxiety 01/10/2013  . History of radiation therapy   . History of breast cancer, left, T1, N0, December 2002. 02/12/2012   Maudry Diego, PT 02/12/2015 5:33 PM  Norwood Levo 02/12/2015, 5:33 PM  [place], [place], [address] Phone: [phone]   Fax:  [phone]

## 2015-02-17 ENCOUNTER — Other Ambulatory Visit: Payer: Self-pay | Admitting: Oncology

## 2015-02-20 ENCOUNTER — Ambulatory Visit: Payer: 59 | Admitting: Physical Therapy

## 2015-02-21 ENCOUNTER — Ambulatory Visit: Payer: 59 | Attending: Oncology | Admitting: Physical Therapy

## 2015-02-21 DIAGNOSIS — I890 Lymphedema, not elsewhere classified: Secondary | ICD-10-CM | POA: Insufficient documentation

## 2015-02-21 NOTE — Therapy (Signed)
[place], [place], [address] Phone: [phone]   Fax:  [phone]  Physical Therapy Treatment  Patient Details  Name: Christina Blackwell MRN: [id number] Date of Birth: Nov 26, 1966 Referring Provider:  Chauncey Cruel, MD  Encounter Date: 02/21/2015      PT End of Session - 02/21/15 1709    Visit Number 7   Number of Visits 13   Date for PT Re-Evaluation 03/23/15   PT Start Time 1632   PT Stop Time 1704   PT Time Calculation (min) 32 min   Activity Tolerance Patient tolerated treatment well   Behavior During Therapy [facility] for tasks assessed/performed      Past Medical History  Diagnosis Date  . History of bilateral breast implants   . GERD (gastroesophageal reflux disease)   . Complication of anesthesia   . PONV (postoperative nausea and vomiting)   . History of radiation therapy 03/30/02-05/16/02    left breast   . History of chemotherapy     cyclosphosphamide/ doxorubicin  . Breast cancer 2002    left   . Breast cancer 2012    right  . Anxiety   . Depression   . History of chemotherapy 4th:June 13,2013    taxol  . History of radiation therapy 06/22/12 -08/06/12    right breast  . Seroma 12/2012 u/s xray    right breast/aspiration 01/19/13    Past Surgical History  Procedure Laterality Date  . Portacath placement    . Portacath removal    . Tonsillectomy and adenoidectomy    . Breast lumpectomy  11/2001    left  . Sentinel lymph node biopsy  11/2001    left axillary  . Breast enhancement surgery  2003    bilateral  . Breast lumpectomy w/ needle localization  02/05/2012    right/ slnbx  . Mirena iud      still in place as of 06/09/12  . Axillary lymph node dissection  03/02/2012    Procedure: AXILLARY LYMPH NODE DISSECTION;  Surgeon: Shann Medal, MD;  Location: [facility];  Service: General;  Laterality: Right;    There were no vitals taken for this visit.  Visit  Diagnosis:  Lymphedema of upper extremity      Subjective Assessment - 02/21/15 1633    Symptoms "Went for a follow-up at [facility] with the surgeon last week.  He still feels like the 2-year mark will tell more (I'm at one year)."  Pt. feels the arm has gotten somewhat better, because now she can feel her elbow bone.   Currently in Pain? No/denies                    [facility] Adult PT Treatment/Exercise - 02/21/15 0001    Manual Therapy   Manual Lymphatic Drainage (MLD) In supine, short neck, left axilla and anterior interaxillary anastomosis, right groin and axillo-inguinal anastomosis, and right UE from fingers to shoulder.   Compression Bandaging Lotion applied, then stockinette, Elastomull to five fingers, ARtiflex x 1, and 1-6 cm., 1-10 cm., and 1-12 cm. short stretch bandage from right hand to shoulder.                   Short Term Clinic Goals - 02/12/15 1731    CC Short Term Goal  #1   Title Pt. will have a reduction of at least 1 cm. in circumference at 15 cm. proximal to ulnar styloid of  right arm.   Status On-going             Long Term Clinic Goals - 02/21/15 1712    CC Long Term Goal  #2   Status Achieved            Plan - 02/21/15 1710    Clinical Impression Statement Pt. was measured for daytime sleeve (Jobst Bella Strong and/or Elvarex soft) and glove; also for Comfort Reidsleeve for nighttime.  Pt. feels benefit of therapy, though she can't come more often than once a week.  She is able to feel her olecranon whereas she couldn't until recently.  Wants to do exercise as well.   Pt will benefit from skilled therapeutic intervention in order to improve on the following deficits Increased edema;Decreased knowledge of precautions;Decreased knowledge of use of DME   Rehab Potential Good   PT Frequency 1x / week   PT Duration 6 weeks   PT Treatment/Interventions Manual lymph drainage;Compression bandaging;Patient/family education   PT Next Visit  Plan Continue complete decongestive therapy.  Begin strength ABC training.   PT Home Exercise Plan Continue to try to bandage at home.   Consulted and Agree with Plan of Care Patient        Problem List Patient Active Problem List   Diagnosis Date Noted  . 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 Hot flashes due to tamoxifen 07/12/2013  . 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 Seroma 01/19/2013  . Anxiety 01/10/2013  . History of radiation therapy   . History of breast cancer, left, T1, N0, December 2002. 02/12/2012    Corri Delapaz 02/21/2015, 5:13 PM  [place], [place], [address] Phone: [phone]   Fax:  [place], PT 02/21/2015 5:13 PM

## 2015-02-27 ENCOUNTER — Ambulatory Visit: Payer: 59 | Admitting: Physical Therapy

## 2015-02-27 DIAGNOSIS — I890 Lymphedema, not elsewhere classified: Secondary | ICD-10-CM

## 2015-02-27 NOTE — Therapy (Addendum)
[place], [place], [address] Phone: [phone]   Fax:  [phone]  Physical Therapy Treatment  Patient Details  Name: Christina Blackwell MRN: [id number] Date of Birth: 1966/09/11 Referring Provider:  Chauncey Cruel, MD  Encounter Date: 02/27/2015    Past Medical History  Diagnosis Date  . History of bilateral breast implants   . GERD (gastroesophageal reflux disease)   . Complication of anesthesia   . PONV (postoperative nausea and vomiting)   . History of radiation therapy 03/30/02-05/16/02    left breast   . History of chemotherapy     cyclosphosphamide/ doxorubicin  . Breast cancer 2002    left   . Breast cancer 2012    right  . Anxiety   . Depression   . History of chemotherapy 4th:June 13,2013    taxol  . History of radiation therapy 06/22/12 -08/06/12    right breast  . Seroma 12/2012 u/s xray    right breast/aspiration 01/19/13    Past Surgical History  Procedure Laterality Date  . Portacath placement    . Portacath removal    . Tonsillectomy and adenoidectomy    . Breast lumpectomy  11/2001    left  . Sentinel lymph node biopsy  11/2001    left axillary  . Breast enhancement surgery  2003    bilateral  . Breast lumpectomy w/ needle localization  02/05/2012    right/ slnbx  . Mirena iud      still in place as of 06/09/12  . Axillary lymph node dissection  03/02/2012    Procedure: AXILLARY LYMPH NODE DISSECTION;  Surgeon: Shann Medal, MD;  Location: [facility];  Service: General;  Laterality: Right;    There were no vitals filed for this visit.  Visit Diagnosis:  Lymphedema of upper extremity                                Short Term Clinic Goals - 02/27/15 1728    CC Short Term Goal  #1   Status Not Met             Long Term Clinic Goals - 02/27/15 1728    CC Long Term Goal  #1   Title Pt. will have reduction of at  least 2 cm. in circumference of right arm at 15 cm. proximal to ulanr styoid.   Status Partially Met   CC Long Term Goal  #2   Title Pt. will have been set up for measurement of new compression garments for daytime use.   Status Achieved   CC Long Term Goal  #3   Title Pt. will be independent in performing self-manual lymph drainage correctly.   Status Achieved            Problem List Patient Active Problem List   Diagnosis Date Noted  . Iron deficiency anemia 05/05/2015  . Herpes 03/28/2015  . 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 Hot flashes due to tamoxifen 07/12/2013  . 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 Seroma 01/19/2013  . Anxiety 01/10/2013  . History of radiation therapy   . Breast cancer of upper-outer quadrant of left female breast ([place]) 02/12/2012    Gitty Osterlund 02/21/2016, 5:20 PM  [place], [place], [address] Phone: [phone]   Fax:  [place], PT 02/21/2016 5:20 PM  PHYSICAL THERAPY DISCHARGE SUMMARY  Visits from Start of Care: 8  Current functional level related to goals / functional outcomes: Goals partially met as noted above.   Remaining deficits: Unknown.  Patient was to call back for a final visit after receiving her compression garments, with therapist to check those as well as review home exercise program.  She did not schedule that visit.   Education / Equipment: Medical illustrator program, self manual lymph drainage, compression garments. Plan: Patient agrees to discharge.  Patient goals were partially met. Patient is being discharged due to the patient's request.  ?????       Serafina Royals, PT 02/21/2016 5:20 PM

## 2015-03-09 ENCOUNTER — Other Ambulatory Visit: Payer: Self-pay

## 2015-03-09 ENCOUNTER — Ambulatory Visit
Admission: RE | Admit: 2015-03-09 | Discharge: 2015-03-09 | Disposition: A | Discharge status: Home / Self Care | Payer: 59 | Source: Ambulatory Visit

## 2015-03-09 DIAGNOSIS — Z1231 Encounter for screening mammogram for malignant neoplasm of breast: Secondary | ICD-10-CM

## 2015-03-28 DIAGNOSIS — B009 Herpesviral infection, unspecified: Secondary | ICD-10-CM | POA: Insufficient documentation

## 2015-04-26 ENCOUNTER — Other Ambulatory Visit (HOSPITAL_BASED_OUTPATIENT_CLINIC_OR_DEPARTMENT_OTHER): Payer: 59

## 2015-04-26 DIAGNOSIS — C50911 Malignant neoplasm of unspecified site of right female breast: Secondary | ICD-10-CM

## 2015-04-26 DIAGNOSIS — Z853 Personal history of malignant neoplasm of breast: Secondary | ICD-10-CM

## 2015-04-26 LAB — CBC WITH DIFFERENTIAL/PLATELET
BASO%: 0.6 % (ref 0.0–2.0)
BASOS ABS: 0 10*3/uL (ref 0.0–0.1)
EOS ABS: 0.3 10*3/uL (ref 0.0–0.5)
EOS%: 5 % (ref 0.0–7.0)
HCT: 33 % — ABNORMAL LOW (ref 34.8–46.6)
HGB: 10 g/dL — ABNORMAL LOW (ref 11.6–15.9)
LYMPH#: 1.7 10*3/uL (ref 0.9–3.3)
LYMPH%: 30.9 % (ref 14.0–49.7)
MCH: 20.3 pg — ABNORMAL LOW (ref 25.1–34.0)
MCHC: 30.2 g/dL — AB (ref 31.5–36.0)
MCV: 67.3 fL — ABNORMAL LOW (ref 79.5–101.0)
MONO#: 0.5 10*3/uL (ref 0.1–0.9)
MONO%: 9.2 % (ref 0.0–14.0)
NEUT#: 3 10*3/uL (ref 1.5–6.5)
NEUT%: 54.3 % (ref 38.4–76.8)
PLATELETS: 262 10*3/uL (ref 145–400)
RBC: 4.91 10*6/uL (ref 3.70–5.45)
RDW: 19.2 % — ABNORMAL HIGH (ref 11.2–14.5)
WBC: 5.6 10*3/uL (ref 3.9–10.3)

## 2015-04-26 LAB — COMPREHENSIVE METABOLIC PANEL (CC13)
ALT: 27 U/L (ref 0–55)
AST: 20 U/L (ref 5–34)
Albumin: 3.9 g/dL (ref 3.5–5.0)
Alkaline Phosphatase: 95 U/L (ref 40–150)
Anion Gap: 11 mEq/L (ref 3–11)
BUN: 14.6 mg/dL (ref 7.0–26.0)
CALCIUM: 9.2 mg/dL (ref 8.4–10.4)
CHLORIDE: 109 meq/L (ref 98–109)
CO2: 22 mEq/L (ref 22–29)
CREATININE: 0.8 mg/dL (ref 0.6–1.1)
EGFR: 87 mL/min/{1.73_m2} — ABNORMAL LOW (ref >90)
Glucose: 106 mg/dl (ref 70–140)
Potassium: 4.2 mEq/L (ref 3.5–5.1)
Sodium: 142 mEq/L (ref 136–145)
Total Bilirubin: 0.26 mg/dL (ref 0.20–1.20)
Total Protein: 7.2 g/dL (ref 6.4–8.3)

## 2015-04-30 LAB — ESTRADIOL, ULTRA SENS: Estradiol, Ultra Sensitive: 4 pg/mL

## 2015-05-03 ENCOUNTER — Ambulatory Visit (HOSPITAL_BASED_OUTPATIENT_CLINIC_OR_DEPARTMENT_OTHER): Payer: 59 | Admitting: Oncology

## 2015-05-03 VITALS — BP 135/77 | HR 102 | Temp 97.7°F | Resp 18

## 2015-05-03 DIAGNOSIS — M8580 Other specified disorders of bone density and structure, unspecified site: Secondary | ICD-10-CM

## 2015-05-03 DIAGNOSIS — F419 Anxiety disorder, unspecified: Secondary | ICD-10-CM

## 2015-05-03 DIAGNOSIS — I890 Lymphedema, not elsewhere classified: Secondary | ICD-10-CM

## 2015-05-03 DIAGNOSIS — C50912 Malignant neoplasm of unspecified site of left female breast: Secondary | ICD-10-CM | POA: Diagnosis not present

## 2015-05-03 DIAGNOSIS — D509 Iron deficiency anemia, unspecified: Secondary | ICD-10-CM | POA: Diagnosis not present

## 2015-05-03 DIAGNOSIS — G4700 Insomnia, unspecified: Secondary | ICD-10-CM

## 2015-05-03 MED ORDER — GABAPENTIN 300 MG PO CAPS: 300.0000 mg | ORAL_CAPSULE | Freq: Two times a day (BID) | ORAL | Status: DC

## 2015-05-03 MED ORDER — ANASTROZOLE 1 MG PO TABS: ORAL_TABLET | ORAL | Status: DC

## 2015-05-03 MED ORDER — LORAZEPAM 0.5 MG PO TABS: 0.5000 mg | ORAL_TABLET | Freq: Every evening | ORAL | Status: DC | PRN

## 2015-05-03 NOTE — Progress Notes (Signed)
ID: Christina Blackwell   DOB: May 04, 1966  MR#: [id number]  CSN#:[id number]   PCP:  GYN:   SUR:  Stark Klein, MD RADONC:  Tyler Pita, MD OTHER:  Crissie Reese, MD;   Jacinto Reap, MD (Plastic [facility], [place]; [place] [phone]) Josepha Pigg, MD ([facility], [place], [place] # [phone]) Melburn Hake, MD ([facility])  CHIEF COMPLAINT:  Hx of Bilateral Breast Cancers  CURRENT THERAPY: Anti-estrogen therapy  HISTORY OF PRESENT ILLNESS: From the original intake note:  Christina Blackwell is an [place] woman formerly followed here for a left-sided stage I invasive ductal carcinoma, which was treated with adjuvant chemotherapy completed in 2003. She was released from followup 2012.   On 12/28/2011 she had bilateral screening mammography showing a possible area of distortion in the right breast. She was recalled for additional views 01/09/2012 and this confirmed an area of architectural distortion in the lateral aspect of the right breast measuring 2.9 cm. This was not palpable by exam. Biopsy was performed the same day and showed ([id number]) and invasive lobular breast cancer, e-cadherin negative, strongly estrogen receptor positive at 85%, progesterone receptor positive at 98%, with an MIB-1 of 9%, and no HER-2 amplification.  Patient is status post right lumpectomy and x-ray lymph node dissection 03/02/2012 for a T2 N2 invasive lobular breast carcinoma, grade 1. 10 of 10 lymph nodes were positive. Margins were close but negative.   Her subsequent history is as detailed below  INTERVAL HISTORY: Christina Blackwell returns today for followup of her bilateral breast cancers.  She continues on anastrozole, which she obtains had a good price. Hot flashes and vaginal dryness are not major problems. She never developed to the arthralgias/myalgias that some patients can experience on that medication  REVIEW OF SYSTEMS: Christina Blackwell  Still feels somewhat anxious and depressed. She is exercising by walking, but when she gets down she  just doesn't feel like walking and instead she eats more. Then she gains weight and get more depressed. She is very aware of the cycle but does not know quite had a break. She does have some arthritis pain chiefly in her knees. She denies any shortness of breath, palpitations, or dizziness. A detailed review of systems today was otherwise noncontributory.  PAST MEDICAL HISTORY: Past Medical History  Diagnosis Date  . History of bilateral breast implants   . GERD (gastroesophageal reflux disease)   . Complication of anesthesia   . PONV (postoperative nausea and vomiting)   . History of radiation therapy 03/30/02-05/16/02    left breast   . History of chemotherapy     cyclosphosphamide/ doxorubicin  . Breast cancer 2002    left   . Breast cancer 2012    right  . Anxiety   . Depression   . History of chemotherapy 4th:June 13,2013    taxol  . History of radiation therapy 06/22/12 -08/06/12    right breast  . Seroma 12/2012 u/s xray    right breast/aspiration 01/19/13    PAST SURGICAL HISTORY: Past Surgical History  Procedure Laterality Date  . Portacath placement    . Portacath removal    . Tonsillectomy and adenoidectomy    . Breast lumpectomy  11/2001    left  . Sentinel lymph node biopsy  11/2001    left axillary  . Breast enhancement surgery  2003    bilateral  . Breast lumpectomy w/ needle localization  02/05/2012    right/ slnbx  . Mirena iud      still in place as of 06/09/12  .  Axillary lymph node dissection  03/02/2012    Procedure: AXILLARY LYMPH NODE DISSECTION;  Surgeon: Shann Medal, MD;  Location: [facility];  Service: General;  Laterality: Right;    FAMILY HISTORY Family History  Problem Relation Age of Onset  . Cancer Mother     breast  . Cancer Paternal Aunt     breast  . Cancer Cousin     breast ca 75's 1st cousin    Gynecologic history:  Menarche age 30, she is GX P0. She is currently using a Mirena implant  and is considering bilateral  salpingo-oophorectomy.  Social history:  (updated 03/22/2014)  She works for [employer] in [place]. She is divorced and lives by herself, with 2 birds, an [place] and a cockateal. She is not a Ambulance person.   ADVANCED DIRECTIVES: in place   HEALTH MAINTENANCE:  (updated 03/22/2014) History  Substance Use Topics  . Smoking status: Never Smoker   . Smokeless tobacco: Never Used  . Alcohol Use: Yes     Comment: social occasio0nally 1-2      Colonoscopy: Never  PAP: Not on file  Bone density: Never  Lipid panel: Not on file    Allergies  Allergen Reactions  . Rocuronium Anaphylaxis    Current Outpatient Prescriptions  Medication Sig Dispense Refill  . anastrozole (ARIMIDEX) 1 MG tablet TAKE 1 TABLET (1 MG TOTAL) BY MOUTH DAILY. 90 tablet 3  . cetirizine (ZYRTEC) 10 MG tablet Take 10 mg by mouth as needed.    . gabapentin (NEURONTIN) 300 MG capsule TAKE ONE CAPSULE BY MOUTH TWICE A DAY 60 capsule 6  . ibuprofen (ADVIL,MOTRIN) 200 MG tablet Take 200 mg by mouth every 6 (six) hours as needed.    Marland Kitchen LORazepam (ATIVAN) 0.5 MG tablet Take 1 tablet (0.5 mg total) by mouth 2 (two) times daily as needed for anxiety or sleep. (Patient not taking: Reported on 01/02/2015) 45 tablet 0  . Multiple Vitamin (MULTIVITAMIN) tablet Take 1 tablet by mouth daily.    Marland Kitchen omeprazole (PRILOSEC) 20 MG capsule Take 20 mg by mouth daily.     No current facility-administered medications for this visit.   Facility-Administered Medications Ordered in Other Visits  Medication Dose Route Frequency Provider Last Rate Last Dose  . topical emolient (BIAFINE) emulsion   Topical Daily Tyler Pita, MD        OBJECTIVE: Middle-aged white woman  In no acute distress Filed Vitals:   05/03/15 1559  BP: 135/77  Pulse: 102  Temp: 97.7 F (36.5 C)  Resp: 18     There is no weight on file to calculate BMI.    ECOG FS: 1  Filed Weights   Sclerae unicteric, EOMs intact Oropharynx  clear, dentition in good repair No cervical or supraclavicular adenopathy Lungs no rales or rhonchi Heart regular rate and rhythm Abd soft,  Obese,nontender, positive bowel sounds MSK no focal spinal tenderness, no upper extremity lymphedema Neuro: nonfocal, well oriented, appropriate affect Breasts:  The right breast is status post mastectomy with TRAM reconstruction. The left breast is status post lumpectomy and radiation. There is no evidence of recurrence on either side. Both axillae are benign.  LAB RESULTS: Lab Results  Component Value Date   WBC 5.6 04/26/2015   NEUTROABS 3.0 04/26/2015   HGB 10.0* 04/26/2015   HCT 33.0* 04/26/2015   MCV 67.3* 04/26/2015   PLT 262 04/26/2015      Chemistry      Component Value  Date/Time   NA 142 04/26/2015 0814   NA 141 07/24/2014 0818   K 4.2 04/26/2015 0814   K 4.1 07/24/2014 0818   CL 106 07/24/2014 0818   CL 105 04/05/2013 1157   CO2 22 04/26/2015 0814   CO2 21 07/24/2014 0818   BUN 14.6 04/26/2015 0814   BUN 16 07/24/2014 0818   CREATININE 0.8 04/26/2015 0814   CREATININE 0.78 07/24/2014 0818      Component Value Date/Time   CALCIUM 9.2 04/26/2015 0814   CALCIUM 9.4 07/24/2014 0818   ALKPHOS 95 04/26/2015 0814   ALKPHOS 64 07/24/2014 0818   AST 20 04/26/2015 0814   AST 20 07/24/2014 0818   ALT 27 04/26/2015 0814   ALT 16 07/24/2014 0818   BILITOT 0.26 04/26/2015 0814   BILITOT 0.3 07/24/2014 0818     Results for KEEARA, FREES (MRN [id number]) as of 05/05/2015 11:52  Ref. Range 01/02/2014 10:35 03/17/2014 15:53 07/24/2014 08:18 11/03/2014 10:12 04/26/2015 08:13  MCV Latest Ref Range: 79.5-101.0 fL 93.1 83.2 75.0 (L) 69.7 (L) 67.3 (L)    STUDIES: CLINICAL DATA: Subsequent treatment strategy for breast cancer.  EXAM: NUCLEAR MEDICINE PET SKULL BASE TO THIGH  TECHNIQUE: A 0.5 mCi F-18 FDG was injected intravenously. Full-ring PET imaging was performed from the skull base to thigh after the radiotracer. CT data  was obtained and used for attenuation correction and anatomic localization.  FASTING BLOOD GLUCOSE: Value: 121 mg/dl  COMPARISON: 10/04/2013  FINDINGS: [facility] would not push screen captures back to PACS.  NECK  5 mm short axis level 1 left-sided cervical lymph node is stable in size and appearance since the prior PET-CT. This is mildly hypermetabolic on today's study ( SUV max = 2.9. No other unexpected hypermetabolic uptake in the neck.  CHEST  There is some mild hypermetabolism associated with the soft tissue around the right axillary clips. SUV max = 2.8 No other unexpected hypermetabolism in the chest.  ABDOMEN/PELVIS  No abnormal hypermetabolic activity within the liver, pancreas, adrenal glands, or spleen. No hypermetabolic lymph nodes in the abdomen or pelvis.  Surgical clips are seen in the left groin region. No unexpected hypermetabolism.  SKELETON  No focal hypermetabolic activity to suggest skeletal metastasis.  IMPRESSION: Low low uptake is identified in a left cervical lymph node which is unchanged in size or appearance since 10/05/2013. Given the stability this lymph node in the low level activity common is most likely postinfectious or postinflammatory.  Borderline uptake identified in some a amorphous soft tissues associated with right axillary surgical clips. Again, this is probably related to uptake in scar.   Electronically Signed  By: Misty Stanley M.D.  On: 11/06/2014 09:26    ASSESSMENT: 49 y.o.  BRCA 1-2 negative [place] woman:   (1) status post left lumpectomy and sentinel lymph node biopsy December 2002 for a 1.9 cm invasive ductal carcinoma, no lymph node involvement, triple negative, treated with 4 cycles of Cytoxan and Adriamycin followed by radiation, with no evidence of disease recurrence to date.   (2) status post Right lumpectomy and axillary lymph node dissection 03/02/2012 for a T2 N3 invasive  lobular breast cancer, grade 1, estrogen receptor 85% and progesterone receptor 98% positive, with HER-2 nonamplified and an MIB-1 of 9%  (3)  completed 4 cycles of docetaxel/cyclophosphamide 05/27/2012  (4)  Status post radiation therapy,completed late August 2013  (5)  she started on tamoxifen August 2013, switched to anastrozole 09/14/2014  (6)  lymphedema, right upper extremity  (  a) s/p lymph node transfer (from left groin to right axilla) February 2015  (7) seroma cavity Right breast, resolved  (8)  status post right mastectomy with TRAM reconstruction at [facility] in February 2015  (9) osteopenia with a T score of -2.4 on bone density scan 11/03/2014   (10) anemia consistent with iron deficiency, with a significant drop in the MCV   PLAN:  Christina Blackwell is doing fine from a breast cancer point of view, with no evidence of disease recurrence. She is not on vitamin D supplementation and we are starting that.    we discussed the drop in her MCV. This is consistent with arm deficiency anemia. Of course she  Is not menstruating and so we have to be concerned regarding a possible GI bleeding source, although this is clinically silent. In any case she just turned 49 so she is due for her colonoscopy. I'm referring her to GI for further evaluation   once she completes GI evaluation she can start ferrous sulfate. She was not interested in receiving intravenous iron though she knows this is an option.  I again strongly urged her to keep a regular exercise program chiefly walking or other form of weightbearing exercise.  She is going to see me again in November and we will repeat her lab work at that time. Chauncey Cruel, MD     05/03/2015

## 2015-05-04 ENCOUNTER — Telehealth: Payer: Self-pay | Admitting: *Deleted

## 2015-05-04 ENCOUNTER — Telehealth: Payer: Self-pay | Admitting: Oncology

## 2015-05-04 NOTE — Telephone Encounter (Signed)
VM message from pt requesting call back from [place], [place]  Regarding yesterday's visit.

## 2015-05-04 NOTE — Telephone Encounter (Signed)
This RN returned call to given number and obtained identified VM - with additional statement stating " mailbox is full and cannot take additional VM ".

## 2015-05-04 NOTE — Telephone Encounter (Signed)
Left message to confirm appointment for November. Mailed calendar.

## 2015-05-05 DIAGNOSIS — D509 Iron deficiency anemia, unspecified: Secondary | ICD-10-CM | POA: Insufficient documentation

## 2015-05-05 MED ORDER — FERROUS SULFATE 325 (65 FE) MG PO TBEC: 325.0000 mg | DELAYED_RELEASE_TABLET | Freq: Three times a day (TID) | ORAL | Status: DC

## 2015-05-05 MED ORDER — VITAMIN D 1000 UNITS PO TABS: 1000.0000 [IU] | ORAL_TABLET | Freq: Every day | ORAL | Status: AC

## 2015-05-18 ENCOUNTER — Encounter: Payer: Self-pay | Admitting: Obstetrics and Gynecology

## 2015-05-21 ENCOUNTER — Other Ambulatory Visit: Payer: Self-pay | Admitting: *Deleted

## 2015-05-21 ENCOUNTER — Encounter: Payer: Self-pay | Admitting: Oncology

## 2015-08-01 ENCOUNTER — Other Ambulatory Visit: Payer: Self-pay

## 2015-08-02 ENCOUNTER — Encounter: Payer: Self-pay | Admitting: Oncology

## 2015-08-02 LAB — CYTOLOGY - PAP

## 2015-08-08 ENCOUNTER — Other Ambulatory Visit: Payer: Self-pay | Admitting: *Deleted

## 2015-08-08 ENCOUNTER — Telehealth: Payer: Self-pay | Admitting: Oncology

## 2015-08-08 DIAGNOSIS — C50912 Malignant neoplasm of unspecified site of left female breast: Secondary | ICD-10-CM

## 2015-08-08 DIAGNOSIS — D509 Iron deficiency anemia, unspecified: Secondary | ICD-10-CM

## 2015-08-08 NOTE — Telephone Encounter (Signed)
s.w. pt to sched laba dn iron....pt did not want to sched at this time due to work...she will call us back to sched

## 2015-08-09 ENCOUNTER — Telehealth: Payer: Self-pay | Admitting: Oncology

## 2015-08-09 NOTE — Telephone Encounter (Signed)
Patient called to schedule labs & Iv Iron. Patient confirmed all appointment for 09/12 & 09/19. Patient also aware of the wait between lab & Iron

## 2015-08-25 ENCOUNTER — Other Ambulatory Visit: Payer: Self-pay | Admitting: Oncology

## 2015-08-27 ENCOUNTER — Other Ambulatory Visit (HOSPITAL_BASED_OUTPATIENT_CLINIC_OR_DEPARTMENT_OTHER): Payer: 59

## 2015-08-27 ENCOUNTER — Ambulatory Visit (HOSPITAL_BASED_OUTPATIENT_CLINIC_OR_DEPARTMENT_OTHER): Payer: 59

## 2015-08-27 VITALS — BP 139/82 | HR 89 | Temp 98.3°F | Resp 18

## 2015-08-27 DIAGNOSIS — D509 Iron deficiency anemia, unspecified: Secondary | ICD-10-CM

## 2015-08-27 DIAGNOSIS — Z853 Personal history of malignant neoplasm of breast: Secondary | ICD-10-CM

## 2015-08-27 DIAGNOSIS — C50911 Malignant neoplasm of unspecified site of right female breast: Secondary | ICD-10-CM

## 2015-08-27 DIAGNOSIS — C50912 Malignant neoplasm of unspecified site of left female breast: Secondary | ICD-10-CM

## 2015-08-27 LAB — FERRITIN CHCC: Ferritin: 9 ng/ml (ref 9–269)

## 2015-08-27 LAB — COMPREHENSIVE METABOLIC PANEL (CC13)
ALBUMIN: 4.2 g/dL (ref 3.5–5.0)
ALK PHOS: 118 U/L (ref 40–150)
ALT: 34 U/L (ref 0–55)
AST: 21 U/L (ref 5–34)
Anion Gap: 9 mEq/L (ref 3–11)
BUN: 13.1 mg/dL (ref 7.0–26.0)
CALCIUM: 9.7 mg/dL (ref 8.4–10.4)
CO2: 25 mEq/L (ref 22–29)
CREATININE: 0.8 mg/dL (ref 0.6–1.1)
Chloride: 108 mEq/L (ref 98–109)
EGFR: >90 mL/min/{1.73_m2} (ref >90)
Glucose: 81 mg/dl (ref 70–140)
POTASSIUM: 4 meq/L (ref 3.5–5.1)
Sodium: 142 mEq/L (ref 136–145)
Total Bilirubin: 0.27 mg/dL (ref 0.20–1.20)
Total Protein: 7.7 g/dL (ref 6.4–8.3)

## 2015-08-27 LAB — CBC WITH DIFFERENTIAL/PLATELET
BASO%: 0.5 % (ref 0.0–2.0)
Basophils Absolute: 0 10*3/uL (ref 0.0–0.1)
EOS%: 3.4 % (ref 0.0–7.0)
Eosinophils Absolute: 0.2 10*3/uL (ref 0.0–0.5)
HEMATOCRIT: 39 % (ref 34.8–46.6)
HGB: 12.3 g/dL (ref 11.6–15.9)
LYMPH%: 31.5 % (ref 14.0–49.7)
MCH: 23.5 pg — ABNORMAL LOW (ref 25.1–34.0)
MCHC: 31.5 g/dL (ref 31.5–36.0)
MCV: 74.7 fL — ABNORMAL LOW (ref 79.5–101.0)
MONO#: 0.6 10*3/uL (ref 0.1–0.9)
MONO%: 8.4 % (ref 0.0–14.0)
NEUT#: 3.8 10*3/uL (ref 1.5–6.5)
NEUT%: 56.2 % (ref 38.4–76.8)
Platelets: 251 10*3/uL (ref 145–400)
RBC: 5.22 10*6/uL (ref 3.70–5.45)
RDW: 22.1 % — ABNORMAL HIGH (ref 11.2–14.5)
WBC: 6.7 10*3/uL (ref 3.9–10.3)
lymph#: 2.1 10*3/uL (ref 0.9–3.3)

## 2015-08-27 MED ORDER — SODIUM CHLORIDE 0.9 % IV SOLN
INTRAVENOUS | Status: DC
  Administered 2015-08-27: 15:00:00 via INTRAVENOUS

## 2015-08-27 MED ORDER — SODIUM CHLORIDE 0.9 % IV SOLN
510.0000 mg | Freq: Once | INTRAVENOUS | Status: AC
  Administered 2015-08-27: 510 mg via INTRAVENOUS
  Filled 2015-08-27: qty 17

## 2015-08-27 NOTE — Patient Instructions (Addendum)

## 2015-08-31 LAB — ESTRADIOL, ULTRA SENS: Estradiol, Ultra Sensitive: <2 pg/mL

## 2015-09-03 ENCOUNTER — Ambulatory Visit (HOSPITAL_BASED_OUTPATIENT_CLINIC_OR_DEPARTMENT_OTHER): Payer: 59

## 2015-09-03 VITALS — BP 126/81 | HR 80 | Temp 98.7°F | Resp 20

## 2015-09-03 DIAGNOSIS — D509 Iron deficiency anemia, unspecified: Secondary | ICD-10-CM

## 2015-09-03 MED ORDER — SODIUM CHLORIDE 0.9 % IV SOLN
Freq: Once | INTRAVENOUS | Status: AC
  Administered 2015-09-03: 09:00:00 via INTRAVENOUS

## 2015-09-03 MED ORDER — SODIUM CHLORIDE 0.9 % IV SOLN
510.0000 mg | Freq: Once | INTRAVENOUS | Status: AC
  Administered 2015-09-03: 510 mg via INTRAVENOUS
  Filled 2015-09-03: qty 17

## 2015-09-03 NOTE — Patient Instructions (Signed)

## 2015-10-16 ENCOUNTER — Other Ambulatory Visit (HOSPITAL_BASED_OUTPATIENT_CLINIC_OR_DEPARTMENT_OTHER): Payer: 59

## 2015-10-16 DIAGNOSIS — Z853 Personal history of malignant neoplasm of breast: Secondary | ICD-10-CM

## 2015-10-16 DIAGNOSIS — C50911 Malignant neoplasm of unspecified site of right female breast: Secondary | ICD-10-CM

## 2015-10-16 LAB — COMPREHENSIVE METABOLIC PANEL (CC13)
ALBUMIN: 4 g/dL (ref 3.5–5.0)
ALK PHOS: 108 U/L (ref 40–150)
ALT: 49 U/L (ref 0–55)
ANION GAP: 9 meq/L (ref 3–11)
AST: 26 U/L (ref 5–34)
BUN: 14.9 mg/dL (ref 7.0–26.0)
CALCIUM: 9.3 mg/dL (ref 8.4–10.4)
CO2: 23 mEq/L (ref 22–29)
Chloride: 108 mEq/L (ref 98–109)
Creatinine: 0.8 mg/dL (ref 0.6–1.1)
EGFR: 84 mL/min/{1.73_m2} — AB (ref >90)
GLUCOSE: 170 mg/dL — AB (ref 70–140)
Potassium: 4 mEq/L (ref 3.5–5.1)
SODIUM: 140 meq/L (ref 136–145)
TOTAL PROTEIN: 7.1 g/dL (ref 6.4–8.3)

## 2015-10-16 LAB — CBC WITH DIFFERENTIAL/PLATELET
BASO%: 0.3 % (ref 0.0–2.0)
Basophils Absolute: 0 10*3/uL (ref 0.0–0.1)
EOS ABS: 0.2 10*3/uL (ref 0.0–0.5)
EOS%: 2.5 % (ref 0.0–7.0)
HEMATOCRIT: 46.7 % — AB (ref 34.8–46.6)
HEMOGLOBIN: 15.4 g/dL (ref 11.6–15.9)
LYMPH%: 35.1 % (ref 14.0–49.7)
MCH: 27.9 pg (ref 25.1–34.0)
MCHC: 32.9 g/dL (ref 31.5–36.0)
MCV: 85 fL (ref 79.5–101.0)
MONO#: 0.5 10*3/uL (ref 0.1–0.9)
MONO%: 7.8 % (ref 0.0–14.0)
NEUT%: 54.3 % (ref 38.4–76.8)
NEUTROS ABS: 3.5 10*3/uL (ref 1.5–6.5)
PLATELETS: 196 10*3/uL (ref 145–400)
RBC: 5.5 10*6/uL — ABNORMAL HIGH (ref 3.70–5.45)
RDW: 23.2 % — AB (ref 11.2–14.5)
WBC: 6.4 10*3/uL (ref 3.9–10.3)
lymph#: 2.2 10*3/uL (ref 0.9–3.3)

## 2015-10-20 LAB — ESTRADIOL, ULTRA SENS: Estradiol, Ultra Sensitive: 4 pg/mL

## 2015-10-23 ENCOUNTER — Telehealth: Payer: Self-pay | Admitting: Oncology

## 2015-10-23 ENCOUNTER — Ambulatory Visit (HOSPITAL_BASED_OUTPATIENT_CLINIC_OR_DEPARTMENT_OTHER): Payer: 59 | Admitting: Oncology

## 2015-10-23 VITALS — BP 129/75 | HR 80 | Temp 98.4°F | Resp 18 | Ht 65.0 in | Wt 194.9 lb

## 2015-10-23 DIAGNOSIS — C50412 Malignant neoplasm of upper-outer quadrant of left female breast: Secondary | ICD-10-CM | POA: Diagnosis not present

## 2015-10-23 DIAGNOSIS — Z79811 Long term (current) use of aromatase inhibitors: Secondary | ICD-10-CM

## 2015-10-23 DIAGNOSIS — F419 Anxiety disorder, unspecified: Secondary | ICD-10-CM

## 2015-10-23 DIAGNOSIS — G4700 Insomnia, unspecified: Secondary | ICD-10-CM

## 2015-10-23 MED ORDER — GABAPENTIN 300 MG PO CAPS: 300.0000 mg | ORAL_CAPSULE | Freq: Two times a day (BID) | ORAL | Status: DC

## 2015-10-23 MED ORDER — LORAZEPAM 0.5 MG PO TABS: 0.5000 mg | ORAL_TABLET | Freq: Every evening | ORAL | Status: AC | PRN

## 2015-10-23 MED ORDER — ANASTROZOLE 1 MG PO TABS: ORAL_TABLET | ORAL | Status: DC

## 2015-10-23 NOTE — Telephone Encounter (Signed)
Left message on voicemail for patient re appointments for May 2017. Mailed schedule.

## 2015-10-23 NOTE — Progress Notes (Signed)
ID: Christina Blackwell   DOB: [birth year]  MR#: [id number]  BEE#:[id number]   PCP:  GYN:   SUR:  Stark Klein, MD RADONC:  Tyler Pita, MD OTHER:  Crissie Reese, MD;   Jacinto Reap, MD (Plastic Surgery, [place]; [place] [phone]) Josepha Pigg, MD ([facility], [place], [place] # [phone]) Melburn Hake, MD ([facility]) Lizbeth Bark M.D.  CHIEF COMPLAINT:  Hx of Bilateral Breast Cancers  CURRENT THERAPY: Anastrozole  HISTORY OF PRESENT ILLNESS: From the original intake note:  Christina Blackwell is an [place] woman formerly followed here for a left-sided stage I invasive ductal carcinoma, which was treated with adjuvant chemotherapy completed in 2003. She was released from followup 2012.   On 12/28/2011 she had bilateral screening mammography showing a possible area of distortion in the right breast. She was recalled for additional views 01/09/2012 and this confirmed an area of architectural distortion in the lateral aspect of the right breast measuring 2.9 cm. This was not palpable by exam. Biopsy was performed the same day and showed ([id number]) and invasive lobular breast cancer, e-cadherin negative, strongly estrogen receptor positive at 85%, progesterone receptor positive at 98%, with an MIB-1 of 9%, and no HER-2 amplification.  Patient is status post right lumpectomy and x-ray lymph node dissection 03/02/2012 for a T2 N2 invasive lobular breast carcinoma, grade 1. 10 of 10 lymph nodes were positive. Margins were close but negative.   Her subsequent history is as detailed below  INTERVAL HISTORY: Christina Blackwell returns today for followup of her estrogen receptor positive bilateral breast cancers.  She continues on anastrozole, which she generally tolerates well. She does have hot flashes. Vaginal dryness is not an issue. She obtains a drug at a very good price. Since her last visit here she also underwent colonoscopy, which showed only some her more rides and diverticuli.  REVIEW OF SYSTEMS: Christina Blackwell is beating  herself up because of weight gain. She recently bought a bike, which she rides around her neighborhood sometimes. She doesn't the lung to a gym however. She admits to dietary indiscretions. For example the day before she had lab work here, Ambulance person, she really went in to the candidate(she dressed opposite which). She has problems with heartburn. She feels forgetful and anxious but denies depression. She finds were quite stressful. A detailed review of systems today was otherwise stable  PAST MEDICAL HISTORY: Past Medical History  Diagnosis Date  . History of bilateral breast implants   . GERD (gastroesophageal reflux disease)   . Complication of anesthesia   . PONV (postoperative nausea and vomiting)   . History of radiation therapy 03/30/02-05/16/02    left breast   . History of chemotherapy     cyclosphosphamide/ doxorubicin  . Breast cancer 2002    left   . Breast cancer 2012    right  . Anxiety   . Depression   . History of chemotherapy 4th:June 13,2013    taxol  . History of radiation therapy 06/22/12 -08/06/12    right breast  . Seroma 12/2012 u/s xray    right breast/aspiration 01/19/13    PAST SURGICAL HISTORY: Past Surgical History  Procedure Laterality Date  . Portacath placement    . Portacath removal    . Tonsillectomy and adenoidectomy    . Breast lumpectomy  11/2001    left  . Sentinel lymph node biopsy  11/2001    left axillary  . Breast enhancement surgery  2003    bilateral  . Breast lumpectomy w/ needle localization  02/05/2012  right/ slnbx  . Mirena iud      still in place as of 06/09/12  . Axillary lymph node dissection  03/02/2012    Procedure: AXILLARY LYMPH NODE DISSECTION;  Surgeon: Shann Medal, MD;  Location: [facility];  Service: General;  Laterality: Right;    FAMILY HISTORY Family History  Problem Relation Age of Onset  . Cancer Mother     breast  . Cancer Paternal Aunt     breast  . Cancer Cousin     breast ca 64's 1st  cousin    Gynecologic history:  Menarche age 48, she is GX P0. She is currently using a Mirena implant  and is considering bilateral salpingo-oophorectomy.  Social history:  (updated 03/22/2014)  She works for [employer] in [place]. She is divorced and lives by herself, with 2 birds, an [place] and a cockateal. She is not a Ambulance person.   ADVANCED DIRECTIVES: in place   HEALTH MAINTENANCE:  (updated 03/22/2014) Social History  Substance Use Topics  . Smoking status: Never Smoker   . Smokeless tobacco: Never Used  . Alcohol Use: Yes     Comment: social occasio0nally 1-2      Colonoscopy: 07/20/2015  PAP: Not on file  Bone density: Never  Lipid panel: Not on file    Allergies  Allergen Reactions  . Rocuronium Anaphylaxis    Current Outpatient Prescriptions  Medication Sig Dispense Refill  . anastrozole (ARIMIDEX) 1 MG tablet TAKE 1 TABLET (1 MG TOTAL) BY MOUTH DAILY. 90 tablet 3  . cetirizine (ZYRTEC) 10 MG tablet Take 10 mg by mouth as needed.    . cholecalciferol (VITAMIN D) 1000 UNITS tablet Take 1 tablet (1,000 Units total) by mouth daily. 100 tablet 4  . ferrous sulfate 325 (65 FE) MG EC tablet Take 1 tablet (325 mg total) by mouth 3 (three) times daily with meals. 100 tablet 3  . gabapentin (NEURONTIN) 300 MG capsule Take 1 capsule (300 mg total) by mouth 2 (two) times daily. 180 capsule 3  . ibuprofen (ADVIL,MOTRIN) 200 MG tablet Take 200 mg by mouth every 6 (six) hours as needed.    Marland Kitchen LORazepam (ATIVAN) 0.5 MG tablet Take 1 tablet (0.5 mg total) by mouth at bedtime as needed for anxiety or sleep. 30 tablet 0  . Multiple Vitamin (MULTIVITAMIN) tablet Take 1 tablet by mouth daily.    Marland Kitchen omeprazole (PRILOSEC) 20 MG capsule Take 20 mg by mouth daily.     No current facility-administered medications for this visit.    OBJECTIVE: Middle-aged white woman who appears well Filed Vitals:   10/23/15 1546  BP: 129/75  Pulse: 80  Temp: 98.4 F  (36.9 C)  Resp: 18     Body mass index is 32.43 kg/(m^2).    ECOG FS: 1  Filed Weights   10/23/15 1546  Weight: 194 lb 14.4 oz (88.406 kg)   Sclerae unicteric, pupils round and equal Oropharynx clear and moist-- no thrush or other lesions No cervical or supraclavicular adenopathy Lungs no rales or rhonchi Heart regular rate and rhythm Abd soft, nontender, positive bowel sounds MSK no focal spinal tenderness, no upper extremity lymphedema Neuro: nonfocal, well oriented, appropriate affect Breasts: The right breast is status post TRAM reconstruction after mastectomy. The cosmetic result is fair. The left breast is status post lumpectomy and radiation. There is no evidence of recurrence locally and both axillae are benign  LAB RESULTS: Lab Results  Component Value Date  WBC 6.4 10/16/2015   NEUTROABS 3.5 10/16/2015   HGB 15.4 10/16/2015   HCT 46.7* 10/16/2015   MCV 85.0 10/16/2015   PLT 196 10/16/2015      Chemistry      Component Value Date/Time   NA 140 10/16/2015 1556   NA 141 07/24/2014 0818   K 4.0 10/16/2015 1556   K 4.1 07/24/2014 0818   CL 106 07/24/2014 0818   CL 105 04/05/2013 1157   CO2 23 10/16/2015 1556   CO2 21 07/24/2014 0818   BUN 14.9 10/16/2015 1556   BUN 16 07/24/2014 0818   CREATININE 0.8 10/16/2015 1556   CREATININE 0.78 07/24/2014 0818      Component Value Date/Time   CALCIUM 9.3 10/16/2015 1556   CALCIUM 9.4 07/24/2014 0818   ALKPHOS 108 10/16/2015 1556   ALKPHOS 64 07/24/2014 0818   AST 26 10/16/2015 1556   AST 20 07/24/2014 0818   ALT 49 10/16/2015 1556   ALT 16 07/24/2014 0818   BILITOT <0.30 10/16/2015 1556   BILITOT 0.3 07/24/2014 0818      STUDIES:No results found.   ASSESSMENT: 48 y.o.  BRCA 1-2 negative Christina Blackwell woman:   (1) status post left lumpectomy and sentinel lymph node biopsy December 2002 for a 1.9 cm invasive ductal carcinoma, no lymph node involvement, triple negative, treated with 4 cycles of Cytoxan and  Adriamycin followed by radiation, with no evidence of disease recurrence to date.   (2) status post Right lumpectomy and axillary lymph node dissection 03/02/2012 for a T2 N3 invasive lobular breast cancer, grade 1, estrogen receptor 85% and progesterone receptor 98% positive, with HER-2 nonamplified and an MIB-1 of 9%  (3)  completed 4 cycles of docetaxel/cyclophosphamide 05/27/2012  (4)  Status post radiation therapy,completed late August 2013  (5)  she started on tamoxifen August 2013, switched to anastrozole 09/14/2014  (6)  lymphedema, right upper extremity  (a) s/p lymph node transfer (from left groin to right axilla) February 2015  (7) seroma cavity Right breast, resolved  (8)  status post right mastectomy with TRAM reconstruction at [facility] in February 2015  (9) osteopenia with a T score of -2.4 on bone density scan 11/03/2014   (10) iron deficiency anemia, status post Feraheme times 08/17/2015, resolved  (a) negative colonoscopy August 2016   PLAN:  Christina Blackwell is 3 years into her antiestrogen therapy with no evidence of disease recurrence. This is very favorable. The plan is to continue anastrozole to August 2018, but possibly beyond.  She tells me she is going to have further plastic surgery "touch up score" at [facility] in the near future.  We reviewed the fact that her aren't deficiency has resolved and she can stop her oral iron at this Blackwell.  What is bothering her the most distant weight problem. We discussed an exercise program and I gave her a copy of the [employer]. We also discussed diet issues and specifically a low carbohydrate diet.  She is gone to see me again in 6 months, after her routine mammography. From that Blackwell we will start seeing her on a once a year basis.  She knows to call for any problems that may develop before her next visit here. Chauncey Cruel, MD     10/23/2015

## 2016-01-16 ENCOUNTER — Telehealth: Payer: Self-pay | Admitting: Oncology

## 2016-01-16 NOTE — Telephone Encounter (Signed)
Appointments rescheduled per patient

## 2016-02-21 ENCOUNTER — Encounter: Payer: Self-pay | Admitting: Oncology

## 2016-03-25 ENCOUNTER — Other Ambulatory Visit: Payer: Self-pay

## 2016-03-25 DIAGNOSIS — Z9011 Acquired absence of right breast and nipple: Secondary | ICD-10-CM

## 2016-03-25 DIAGNOSIS — Z1231 Encounter for screening mammogram for malignant neoplasm of breast: Secondary | ICD-10-CM

## 2016-03-25 DIAGNOSIS — Z9889 Other specified postprocedural states: Secondary | ICD-10-CM

## 2016-04-23 ENCOUNTER — Other Ambulatory Visit: Payer: 59

## 2016-04-25 ENCOUNTER — Other Ambulatory Visit: Payer: Self-pay | Admitting: *Deleted

## 2016-04-25 DIAGNOSIS — C50412 Malignant neoplasm of upper-outer quadrant of left female breast: Secondary | ICD-10-CM

## 2016-04-28 ENCOUNTER — Ambulatory Visit: Payer: 59 | Admitting: Oncology

## 2016-04-28 ENCOUNTER — Other Ambulatory Visit (HOSPITAL_BASED_OUTPATIENT_CLINIC_OR_DEPARTMENT_OTHER): Payer: 59

## 2016-04-28 DIAGNOSIS — C50412 Malignant neoplasm of upper-outer quadrant of left female breast: Secondary | ICD-10-CM | POA: Diagnosis not present

## 2016-04-28 LAB — CBC WITH DIFFERENTIAL/PLATELET
BASO%: 0.1 % (ref 0.0–2.0)
BASOS ABS: 0 10*3/uL (ref 0.0–0.1)
EOS ABS: 0.3 10*3/uL (ref 0.0–0.5)
EOS%: 4.3 % (ref 0.0–7.0)
HEMATOCRIT: 45 % (ref 34.8–46.6)
HGB: 15.6 g/dL (ref 11.6–15.9)
LYMPH#: 2.2 10*3/uL (ref 0.9–3.3)
LYMPH%: 29.2 % (ref 14.0–49.7)
MCH: 31.1 pg (ref 25.1–34.0)
MCHC: 34.7 g/dL (ref 31.5–36.0)
MCV: 89.6 fL (ref 79.5–101.0)
MONO#: 0.6 10*3/uL (ref 0.1–0.9)
MONO%: 7.5 % (ref 0.0–14.0)
NEUT#: 4.5 10*3/uL (ref 1.5–6.5)
NEUT%: 58.9 % (ref 38.4–76.8)
Platelets: 202 10*3/uL (ref 145–400)
RBC: 5.02 10*6/uL (ref 3.70–5.45)
RDW: 13.3 % (ref 11.2–14.5)
WBC: 7.6 10*3/uL (ref 3.9–10.3)

## 2016-04-28 LAB — COMPREHENSIVE METABOLIC PANEL
ALBUMIN: 4.2 g/dL (ref 3.5–5.0)
ALK PHOS: 108 U/L (ref 40–150)
ALT: 35 U/L (ref 0–55)
ANION GAP: 9 meq/L (ref 3–11)
AST: 17 U/L (ref 5–34)
BUN: 18.4 mg/dL (ref 7.0–26.0)
CALCIUM: 9.5 mg/dL (ref 8.4–10.4)
CHLORIDE: 109 meq/L (ref 98–109)
CO2: 24 mEq/L (ref 22–29)
Creatinine: 0.8 mg/dL (ref 0.6–1.1)
EGFR: 83 mL/min/{1.73_m2} — AB (ref >90)
Glucose: 137 mg/dl (ref 70–140)
POTASSIUM: 3.8 meq/L (ref 3.5–5.1)
Sodium: 141 mEq/L (ref 136–145)
Total Bilirubin: <0.3 mg/dL (ref 0.20–1.20)
Total Protein: 7.4 g/dL (ref 6.4–8.3)

## 2016-05-05 ENCOUNTER — Ambulatory Visit: Payer: 59 | Admitting: Oncology

## 2016-05-07 ENCOUNTER — Ambulatory Visit
Admission: RE | Admit: 2016-05-07 | Discharge: 2016-05-07 | Disposition: A | Discharge status: Home / Self Care | Payer: 59 | Source: Ambulatory Visit

## 2016-05-07 ENCOUNTER — Other Ambulatory Visit: Payer: Self-pay | Admitting: Oncology

## 2016-05-07 ENCOUNTER — Ambulatory Visit (HOSPITAL_BASED_OUTPATIENT_CLINIC_OR_DEPARTMENT_OTHER): Payer: 59 | Admitting: Oncology

## 2016-05-07 VITALS — BP 136/93 | HR 68 | Temp 98.5°F | Resp 18 | Ht 65.0 in | Wt 188.3 lb

## 2016-05-07 DIAGNOSIS — C50911 Malignant neoplasm of unspecified site of right female breast: Secondary | ICD-10-CM | POA: Diagnosis not present

## 2016-05-07 DIAGNOSIS — I890 Lymphedema, not elsewhere classified: Secondary | ICD-10-CM

## 2016-05-07 DIAGNOSIS — Z9011 Acquired absence of right breast and nipple: Secondary | ICD-10-CM

## 2016-05-07 DIAGNOSIS — C50412 Malignant neoplasm of upper-outer quadrant of left female breast: Secondary | ICD-10-CM

## 2016-05-07 DIAGNOSIS — Z853 Personal history of malignant neoplasm of breast: Secondary | ICD-10-CM | POA: Diagnosis not present

## 2016-05-07 DIAGNOSIS — Z1231 Encounter for screening mammogram for malignant neoplasm of breast: Secondary | ICD-10-CM

## 2016-05-07 DIAGNOSIS — M8580 Other specified disorders of bone density and structure, unspecified site: Secondary | ICD-10-CM | POA: Diagnosis not present

## 2016-05-07 DIAGNOSIS — Z9889 Other specified postprocedural states: Secondary | ICD-10-CM

## 2016-05-07 MED ORDER — ANASTROZOLE 1 MG PO TABS: ORAL_TABLET | ORAL | Status: DC

## 2016-05-07 NOTE — Progress Notes (Signed)
ID: Christina Blackwell   DOB: [birth year]  MR#: [id number]  PHX#:[id number]   PCP:  GYN:   SUR:  Christina Klein, MD RADONC:  Christina Pita, MD OTHER:  Christina Reese, MD;   Christina Reap, MD (Plastic Surgery, [place]; [place] [phone]) Christina Pigg, MD ([facility], [place], [place] # [phone]) Christina Hake, MD ([facility]) Christina Blackwell M.D.  CHIEF COMPLAINT:  Hx of Bilateral Breast Cancers  CURRENT THERAPY: Anastrozole  HISTORY OF PRESENT ILLNESS: From the original intake note:  Christina Blackwell is an [place] woman formerly followed here for a left-sided stage I invasive ductal carcinoma, which was treated with adjuvant chemotherapy completed in 2003. She was released from followup 2012.   On 12/28/2011 she had bilateral screening mammography showing a possible area of distortion in the right breast. She was recalled for additional views 01/09/2012 and this confirmed an area of architectural distortion in the lateral aspect of the right breast measuring 2.9 cm. This was not palpable by exam. Biopsy was performed the same day and showed ([id number]) and invasive lobular breast cancer, e-cadherin negative, strongly estrogen receptor positive at 85%, progesterone receptor positive at 98%, with an MIB-1 of 9%, and no HER-2 amplification.  Patient is status post right lumpectomy and x-ray lymph node dissection 03/02/2012 for a T2 N2 invasive lobular breast carcinoma, grade 1. 10 of 10 lymph nodes were positive. Margins were close but negative.   Her subsequent history is as detailed below  INTERVAL HISTORY: Christina Blackwell returns today for followup of her estrogen receptor positive bilateral breast cancers.  She continues on anastrozole. She still has some hot flashes, but they are not a major source of discomfort and in fact she stopped taking the nighttime Neurontin since she didn't think it was worth continuing with that. She has some vaginal dryness issues. She obtains a drug at a very good price.  REVIEW OF  SYSTEMS: Christina Blackwell has been having trouble going through her insurance company to get a replacement for her lymphedema compression sleeve. The place where she used to get it no longer accepts her insurance. She had to go to [place] to obtain these. She is exercising regularly by walking. A detailed review of systems today was otherwise stable  PAST MEDICAL HISTORY: Past Medical History  Diagnosis Date  . History of bilateral breast implants   . GERD (gastroesophageal reflux disease)   . Complication of anesthesia   . PONV (postoperative nausea and vomiting)   . History of radiation therapy 03/30/02-05/16/02    left breast   . History of chemotherapy     cyclosphosphamide/ doxorubicin  . Breast cancer 2002    left   . Breast cancer 2012    right  . Anxiety   . Depression   . History of chemotherapy 4th:June 13,2013    taxol  . History of radiation therapy 06/22/12 -08/06/12    right breast  . Seroma 12/2012 u/s xray    right breast/aspiration 01/19/13    PAST SURGICAL HISTORY: Past Surgical History  Procedure Laterality Date  . Portacath placement    . Portacath removal    . Tonsillectomy and adenoidectomy    . Breast lumpectomy  11/2001    left  . Sentinel lymph node biopsy  11/2001    left axillary  . Breast enhancement surgery  2003    bilateral  . Breast lumpectomy w/ needle localization  02/05/2012    right/ slnbx  . Mirena iud      still in place as of 06/09/12  .  Axillary lymph node dissection  03/02/2012    Procedure: AXILLARY LYMPH NODE DISSECTION;  Surgeon: Shann Medal, MD;  Location: [facility];  Service: General;  Laterality: Right;    FAMILY HISTORY Family History  Problem Relation Age of Onset  . Cancer Mother     breast  . Cancer Paternal Aunt     breast  . Cancer Cousin     breast ca 40's 1st cousin    Gynecologic history:  Menarche age 30, she is GX P0. She is currently using a Mirena implant  and is considering bilateral  salpingo-oophorectomy.  Social history:  (updated 03/22/2014)  She works for [employer] in [place]. She is divorced and lives by herself, with 2 birds, an [place] and a cockateal. She is not a Ambulance person.   ADVANCED DIRECTIVES: in place   HEALTH MAINTENANCE:  (updated 03/22/2014) Social History  Substance Use Topics  . Smoking status: Never Smoker   . Smokeless tobacco: Never Used  . Alcohol Use: Yes     Comment: social occasio0nally 1-2      Colonoscopy: 07/20/2015  PAP: Not on file  Bone density: Never  Lipid panel: Not on file    Allergies  Allergen Reactions  . Rocuronium Anaphylaxis    Current Outpatient Prescriptions  Medication Sig Dispense Refill  . anastrozole (ARIMIDEX) 1 MG tablet TAKE 1 TABLET (1 MG TOTAL) BY MOUTH DAILY. 90 tablet 3  . cetirizine (ZYRTEC) 10 MG tablet Take 10 mg by mouth as needed.    . cholecalciferol (VITAMIN D) 1000 UNITS tablet Take 1 tablet (1,000 Units total) by mouth daily. 100 tablet 4  . gabapentin (NEURONTIN) 300 MG capsule Take 1 capsule (300 mg total) by mouth 2 (two) times daily. 180 capsule 3  . ibuprofen (ADVIL,MOTRIN) 200 MG tablet Take 200 mg by mouth every 6 (six) hours as needed.    Marland Kitchen LORazepam (ATIVAN) 0.5 MG tablet Take 1 tablet (0.5 mg total) by mouth at bedtime as needed for anxiety or sleep. 30 tablet 0  . Multiple Vitamin (MULTIVITAMIN) tablet Take 1 tablet by mouth daily.    Marland Kitchen omeprazole (PRILOSEC) 20 MG capsule Take 20 mg by mouth daily.     No current facility-administered medications for this visit.    OBJECTIVE: Middle-aged white woman In no acute distress  Filed Vitals:   05/07/16 1604  BP: 136/93  Pulse: 68  Temp: 98.5 F (36.9 C)  Resp: 18     Body mass index is 31.33 kg/(m^2).    ECOG FS: 0  Filed Weights   05/07/16 1604  Weight: 188 lb 4.8 oz (85.412 kg)   Sclerae unicteric, EOMs intact Oropharynx clear, dentition in good repair No cervical or supraclavicular  adenopathy Lungs no rales or rhonchi Heart regular rate and rhythm Abd soft, nontender, positive bowel sounds MSK no focal spinal tenderness, no upper extremity lymphedema Neuro: nonfocal, well oriented, appropriate affect Breasts: The right breast is status post mastectomy with TRAM reconstruction. The left breast is status post lumpectomy and radiation. There is no evidence of recurrence on the chest. Both axillae are benign.  LAB RESULTS: Lab Results  Component Value Date   WBC 7.6 04/28/2016   NEUTROABS 4.5 04/28/2016   HGB 15.6 04/28/2016   HCT 45.0 04/28/2016   MCV 89.6 04/28/2016   PLT 202 04/28/2016      Chemistry      Component Value Date/Time   NA 141 04/28/2016 1611   NA 141  07/24/2014 0818   K 3.8 04/28/2016 1611   K 4.1 07/24/2014 0818   CL 106 07/24/2014 0818   CL 105 04/05/2013 1157   CO2 24 04/28/2016 1611   CO2 21 07/24/2014 0818   BUN 18.4 04/28/2016 1611   BUN 16 07/24/2014 0818   CREATININE 0.8 04/28/2016 1611   CREATININE 0.78 07/24/2014 0818      Component Value Date/Time   CALCIUM 9.5 04/28/2016 1611   CALCIUM 9.4 07/24/2014 0818   ALKPHOS 108 04/28/2016 1611   ALKPHOS 64 07/24/2014 0818   AST 17 04/28/2016 1611   AST 20 07/24/2014 0818   ALT 35 04/28/2016 1611   ALT 16 07/24/2014 0818   BILITOT <0.30 04/28/2016 1611   BILITOT 0.3 07/24/2014 0818      STUDIES: She had her annual mammography earlier today. Results are pending  ASSESSMENT: 50 y.o.  BRCA 1-2 negative [place] woman:   (1) status post left lumpectomy and sentinel lymph node biopsy December 2002 for a 1.9 cm invasive ductal carcinoma, no lymph node involvement, triple negative, treated with 4 cycles of Cytoxan and Adriamycin followed by radiation, with no evidence of disease recurrence to date.   (2) status post Right lumpectomy and axillary lymph node dissection 03/02/2012 for a T2 N3 invasive lobular breast cancer, grade 1, estrogen receptor 85% and progesterone receptor  98% positive, with HER-2 nonamplified and an MIB-1 of 9%  (3)  completed 4 cycles of docetaxel/cyclophosphamide 05/27/2012  (4)  Status post radiation therapy,completed late August 2013  (5)  she started on tamoxifen August 2013, switched to anastrozole 09/14/2014  (6)  lymphedema, right upper extremity  (a) s/p lymph node transfer (from left groin to right axilla) February 2015  (7) seroma cavity Right breast, resolved  (8)  status post right mastectomy with TRAM reconstruction at [facility] in February 2015  (9) osteopenia with a T score of -2.4 on bone density scan 11/03/2014   (10) iron deficiency anemia, status post Feraheme times 08/17/2015, resolved  (a) negative colonoscopy August 2016   PLAN:  Christina Blackwell is now 4 years out from definitive surgery for her breast cancer, with no evidence of disease recurrence. This is very favorable.  We discussed the fact that when she sees me next year she will be 5 years out and could consider "graduate in". However my recommendation is that she continue the anastrozole and additional 5 years which would further reduce her risk of recurrence by approximately 3%. This is very much what she intends to do.  We do need to obtain a bone density scan. We have set this up for December. If there is significant bone loss we will consider Prolia.  She is doing well as far as her right upper extremity lymphedema is concerned. She will let us know if she has any difficulty obtaining a replacement for her compression sleeve  Christina Blackwell knows to call for any problems that may develop before her next visit here. Chauncey Cruel, MD     05/07/2016

## 2016-11-10 ENCOUNTER — Ambulatory Visit
Admission: RE | Admit: 2016-11-10 | Discharge: 2016-11-10 | Disposition: A | Discharge status: Home / Self Care | Payer: 59 | Source: Ambulatory Visit | Attending: Oncology | Admitting: Oncology

## 2016-11-10 DIAGNOSIS — C50412 Malignant neoplasm of upper-outer quadrant of left female breast: Secondary | ICD-10-CM

## 2016-11-10 DIAGNOSIS — M8580 Other specified disorders of bone density and structure, unspecified site: Secondary | ICD-10-CM

## 2016-11-20 ENCOUNTER — Encounter: Payer: Self-pay | Admitting: Oncology

## 2016-12-17 DIAGNOSIS — H93299 Other abnormal auditory perceptions, unspecified ear: Secondary | ICD-10-CM | POA: Diagnosis not present

## 2016-12-17 DIAGNOSIS — H6980 Other specified disorders of Eustachian tube, unspecified ear: Secondary | ICD-10-CM | POA: Diagnosis not present

## 2016-12-17 DIAGNOSIS — J301 Allergic rhinitis due to pollen: Secondary | ICD-10-CM | POA: Diagnosis not present

## 2017-01-02 ENCOUNTER — Other Ambulatory Visit: Payer: Self-pay | Admitting: Nurse Practitioner

## 2017-01-16 ENCOUNTER — Telehealth: Payer: Self-pay | Admitting: Oncology

## 2017-01-16 NOTE — Telephone Encounter (Signed)
lvm to inform pt of 2/22 appt at 10 am. Per telephone message pt needed a sooner appt than May 2018

## 2017-01-16 NOTE — Telephone Encounter (Signed)
Patient called about her 2018 appointment and knows it shouldn't be till may but she is concerned that something is going on and needs to be seen sooner if someone would call her back

## 2017-02-02 DIAGNOSIS — K219 Gastro-esophageal reflux disease without esophagitis: Secondary | ICD-10-CM | POA: Diagnosis not present

## 2017-02-03 DIAGNOSIS — I890 Lymphedema, not elsewhere classified: Secondary | ICD-10-CM | POA: Diagnosis not present

## 2017-02-03 DIAGNOSIS — E8989 Other postprocedural endocrine and metabolic complications and disorders: Secondary | ICD-10-CM | POA: Diagnosis not present

## 2017-02-03 DIAGNOSIS — M654 Radial styloid tenosynovitis [de Quervain]: Secondary | ICD-10-CM | POA: Diagnosis not present

## 2017-02-05 ENCOUNTER — Telehealth: Payer: Self-pay | Admitting: *Deleted

## 2017-02-05 ENCOUNTER — Ambulatory Visit (HOSPITAL_BASED_OUTPATIENT_CLINIC_OR_DEPARTMENT_OTHER): Payer: 59 | Admitting: Oncology

## 2017-02-05 ENCOUNTER — Other Ambulatory Visit (HOSPITAL_BASED_OUTPATIENT_CLINIC_OR_DEPARTMENT_OTHER): Payer: 59

## 2017-02-05 VITALS — BP 121/64 | HR 70 | Temp 98.3°F | Resp 18 | Ht 65.0 in | Wt 174.4 lb

## 2017-02-05 DIAGNOSIS — Z79811 Long term (current) use of aromatase inhibitors: Secondary | ICD-10-CM

## 2017-02-05 DIAGNOSIS — Z853 Personal history of malignant neoplasm of breast: Secondary | ICD-10-CM

## 2017-02-05 DIAGNOSIS — M818 Other osteoporosis without current pathological fracture: Secondary | ICD-10-CM

## 2017-02-05 DIAGNOSIS — C50911 Malignant neoplasm of unspecified site of right female breast: Secondary | ICD-10-CM | POA: Diagnosis not present

## 2017-02-05 DIAGNOSIS — C50412 Malignant neoplasm of upper-outer quadrant of left female breast: Secondary | ICD-10-CM

## 2017-02-05 DIAGNOSIS — Z171 Estrogen receptor negative status [ER-]: Secondary | ICD-10-CM | POA: Diagnosis not present

## 2017-02-05 DIAGNOSIS — M8580 Other specified disorders of bone density and structure, unspecified site: Secondary | ICD-10-CM

## 2017-02-05 DIAGNOSIS — M810 Age-related osteoporosis without current pathological fracture: Secondary | ICD-10-CM | POA: Insufficient documentation

## 2017-02-05 DIAGNOSIS — I890 Lymphedema, not elsewhere classified: Secondary | ICD-10-CM | POA: Diagnosis not present

## 2017-02-05 LAB — COMPREHENSIVE METABOLIC PANEL
ALBUMIN: 4.3 g/dL (ref 3.5–5.0)
ALK PHOS: 91 U/L (ref 40–150)
ALT: 20 U/L (ref 0–55)
AST: 11 U/L (ref 5–34)
Anion Gap: 10 mEq/L (ref 3–11)
BUN: 18.8 mg/dL (ref 7.0–26.0)
CALCIUM: 9.8 mg/dL (ref 8.4–10.4)
CO2: 26 mEq/L (ref 22–29)
Chloride: 105 mEq/L (ref 98–109)
Creatinine: 0.8 mg/dL (ref 0.6–1.1)
EGFR: 89 mL/min/{1.73_m2} — AB (ref >90)
Glucose: 83 mg/dl (ref 70–140)
POTASSIUM: 4 meq/L (ref 3.5–5.1)
SODIUM: 141 meq/L (ref 136–145)
Total Bilirubin: 0.38 mg/dL (ref 0.20–1.20)
Total Protein: 7.2 g/dL (ref 6.4–8.3)

## 2017-02-05 LAB — CBC WITH DIFFERENTIAL/PLATELET
BASO%: 0.1 % (ref 0.0–2.0)
BASOS ABS: 0 10*3/uL (ref 0.0–0.1)
EOS ABS: 0.1 10*3/uL (ref 0.0–0.5)
EOS%: 0.9 % (ref 0.0–7.0)
HEMATOCRIT: 43.3 % (ref 34.8–46.6)
HEMOGLOBIN: 14.7 g/dL (ref 11.6–15.9)
LYMPH%: 36.6 % (ref 14.0–49.7)
MCH: 30.2 pg (ref 25.1–34.0)
MCHC: 33.9 g/dL (ref 31.5–36.0)
MCV: 89.1 fL (ref 79.5–101.0)
MONO#: 0.9 10*3/uL (ref 0.1–0.9)
MONO%: 10.1 % (ref 0.0–14.0)
NEUT#: 4.8 10*3/uL (ref 1.5–6.5)
NEUT%: 52.3 % (ref 38.4–76.8)
Platelets: 227 10*3/uL (ref 145–400)
RBC: 4.86 10*6/uL (ref 3.70–5.45)
RDW: 13.7 % (ref 11.2–14.5)
WBC: 9.2 10*3/uL (ref 3.9–10.3)
lymph#: 3.4 10*3/uL — ABNORMAL HIGH (ref 0.9–3.3)

## 2017-02-05 MED ORDER — ANASTROZOLE 1 MG PO TABS: ORAL_TABLET | ORAL | 3 refills | Status: DC

## 2017-02-05 NOTE — Telephone Encounter (Signed)
This RN spoke with pt per her call stating concern " because when I got home I looked on the computer for what the Prolia shot would cost and I am seeing it is like $1600 a shot "  This RN informed pt prior to injection this office contacts her insurance company for prior authorization and then her out of pocket cost can be determined from there.  Appointment for 02/10/2017 requested to be rescheduled to following week to allow time for insurance benefits.  Christina Blackwell appreciated above discussion and will await call from [place] regarding cost.

## 2017-02-05 NOTE — Progress Notes (Signed)
ID: [name]   DOB: 1966-05-29  MR#: [id number]  NOI#:[id number]   PCP:  GYN:   SUR:  Stark Klein, MD [place]:  Tyler Pita, MD OTHER:  Crissie Reese, MD;   Jacinto Reap, MD (Plastic Surgery, [place]; [place] [phone]) Josepha Pigg, MD ([facility], [place], [place] # [phone]) Melburn Hake, MD ([facility]) Lizbeth Bark M.D.  CHIEF COMPLAINT:  Hx of Bilateral Breast Cancers  CURRENT THERAPY: Anastrozole  HISTORY OF PRESENT ILLNESS: From the original intake note:  Classie is an [place] woman formerly followed here for a left-sided stage I invasive ductal carcinoma, which was treated with adjuvant chemotherapy completed in 2003. She was released from followup 2012.   On 12/28/2011 she had bilateral screening mammography showing a possible area of distortion in the right breast. She was recalled for additional views 01/09/2012 and this confirmed an area of architectural distortion in the lateral aspect of the right breast measuring 2.9 cm. This was not palpable by exam. Biopsy was performed the same day and showed ([id number]) and invasive lobular breast cancer, e-cadherin negative, strongly estrogen receptor positive at 85%, progesterone receptor positive at 98%, with an MIB-1 of 9%, and no HER-2 amplification.  Patient is status post right lumpectomy and x-ray lymph node dissection 03/02/2012 for a T2 N2 invasive lobular breast carcinoma, grade 1. 10 of 10 lymph nodes were positive. Margins were close but negative.   Her subsequent history is as detailed below  INTERVAL HISTORY: [name] returns today for follow-up of her locally advanced estrogen receptor positive lobular breast cancer. She continues on anastrozole, which she generally tolerates well. Hot flashes and vaginal dryness are not a major issue. She never developed the arthralgias or myalgias that many patients can experience on this medication. She obtains it at a good price.  Since her last visit here she has noted  something between her to breasts which is of concern to her for possible recurrence.  REVIEW OF SYSTEMS: [name]  Has some low back pain and a little bit of left-sided sciatica whichher primary care physician is treating with muscle relaxants and nonsteroidals. Chiropractor was not very helpful. She has lost a little weight with the help of medication. She is on a walking program but only on good days and her workers very demanding your she gets out of the house of about 5:30 in the morning and doesn't get back until around 6 in the evening. She doesn't have a lot of energy leftover for exercise. She is trying to lose weight. He detailed review of systems today was otherwise noncontributory  PAST MEDICAL HISTORY: Past Medical History:  Diagnosis Date  . Anxiety   . Breast cancer 2002   left   . Breast cancer 2012   right  . Complication of anesthesia   . Depression   . GERD (gastroesophageal reflux disease)   . History of bilateral breast implants   . History of chemotherapy    cyclosphosphamide/ doxorubicin  . History of chemotherapy 4th:June 13,2013   taxol  . History of radiation therapy 03/30/02-05/16/02   left breast   . History of radiation therapy 06/22/12 -08/06/12   right breast  . PONV (postoperative nausea and vomiting)   . Seroma 12/2012 u/s xray   right breast/aspiration 01/19/13    PAST SURGICAL HISTORY: Past Surgical History:  Procedure Laterality Date  . AXILLARY LYMPH NODE DISSECTION  03/02/2012   Procedure: AXILLARY LYMPH NODE DISSECTION;  Surgeon: Shann Medal, MD;  Location: [facility];  Service:  General;  Laterality: Right;  . BREAST ENHANCEMENT SURGERY  2003   bilateral  . BREAST LUMPECTOMY  11/2001   left  . BREAST LUMPECTOMY W/ NEEDLE LOCALIZATION  02/05/2012   right/ slnbx  . mirena IUD     still in place as of 06/09/12  . PORTACATH PLACEMENT    . Portacath removal    . SENTINEL LYMPH NODE BIOPSY  11/2001   left axillary  . TONSILLECTOMY AND  ADENOIDECTOMY      FAMILY HISTORY Family History  Problem Relation Age of Onset  . Cancer Mother     breast  . Cancer Paternal Aunt     breast  . Cancer Cousin     breast ca 37's 1st cousin    Gynecologic history:  Menarche age 18, she is GX P0. She is currently using a Mirena implant  and is considering bilateral salpingo-oophorectomy.  Social history:  (updated 03/22/2014)  She works for [employer] in [place]. She is divorced and lives by herself, with 2 birds, an Horse Cave and a cockateal. She is not a Ambulance person.   ADVANCED DIRECTIVES: in place   HEALTH MAINTENANCE:  (updated 03/22/2014) Social History  Substance Use Topics  . Smoking status: Never Smoker  . Smokeless tobacco: Never Used  . Alcohol use Yes     Comment: social occasio0nally 1-2      Colonoscopy: 07/20/2015  PAP: Not on file  Bone density: Never  Lipid panel: Not on file    Allergies  Allergen Reactions  . Rocuronium Anaphylaxis    Current Outpatient Prescriptions  Medication Sig Dispense Refill  . Lorcaserin HCl (BELVIQ PO) Take by mouth.    . naproxen (NAPROSYN) 250 MG tablet Take by mouth 2 (two) times daily with a meal.    . anastrozole (ARIMIDEX) 1 MG tablet TAKE 1 TABLET (1 MG TOTAL) BY MOUTH DAILY. 90 tablet 3  . cetirizine (ZYRTEC) 10 MG tablet Take 10 mg by mouth as needed.    . cholecalciferol (VITAMIN D) 1000 UNITS tablet Take 1 tablet (1,000 Units total) by mouth daily. 100 tablet 4  . ibuprofen (ADVIL,MOTRIN) 200 MG tablet Take 200 mg by mouth every 6 (six) hours as needed.    Marland Kitchen LORazepam (ATIVAN) 0.5 MG tablet Take 1 tablet (0.5 mg total) by mouth at bedtime as needed for anxiety or sleep. 30 tablet 0  . Multiple Vitamin (MULTIVITAMIN) tablet Take 1 tablet by mouth daily.    Marland Kitchen omeprazole (PRILOSEC) 20 MG capsule Take 20 mg by mouth daily.     No current facility-administered medications for this visit.     OBJECTIVE: Middle-aged white woman who  appears stated age  51:   02/05/17 1106  BP: 121/64  Pulse: 70  Resp: 18  Temp: 98.3 F (36.8 C)     Body mass index is 29.02 kg/m.    ECOG FS: 0  Filed Weights   02/05/17 1106  Weight: 174 lb 6.4 oz (79.1 kg)   Sclerae unicteric, EOMs intact Oropharynx clear and moist No cervical or supraclavicular adenopathy Lungs no rales or rhonchi Heart regular rate and rhythm Abd soft, nontender, positive bowel sounds MSK minimal lumbarspinal tenderness, chronic grade 1 right upper extremity lymphedema Neuro: nonfocal, well oriented, appropriate affect Breasts: there is no evidence of disease recurrence in either breast and both axillae are benign. The mass" she is feeling between her breasts is her xiphoid process.  LAB RESULTS: Lab Results  Component Value Date   WBC  9.2 02/05/2017   NEUTROABS 4.8 02/05/2017   HGB 14.7 02/05/2017   HCT 43.3 02/05/2017   MCV 89.1 02/05/2017   PLT 227 02/05/2017      Chemistry      Component Value Date/Time   NA 141 02/05/2017 1030   K 4.0 02/05/2017 1030   CL 106 07/24/2014 0818   CL 105 04/05/2013 1157   CO2 26 02/05/2017 1030   BUN 18.8 02/05/2017 1030   CREATININE 0.8 02/05/2017 1030      Component Value Date/Time   CALCIUM 9.8 02/05/2017 1030   ALKPHOS 91 02/05/2017 1030   AST 11 02/05/2017 1030   ALT 20 02/05/2017 1030   BILITOT 0.38 02/05/2017 1030      STUDIES: EXAM: DUAL X-RAY ABSORPTIOMETRY (DXA) FOR BONE MINERAL DENSITY  IMPRESSION: Referring Physician:  Chauncey Cruel  PATIENT: Name: KYANI, SIMKIN Patient ID: [id number] Birth Date: [birth year] Height: 65.0 in. Sex: Female Measured: 11/10/2016 Weight: 178.0 lbs. Indications: Anastrazole, Breast Cancer History, Caucasian, Estrogen Deficient, Low Calcium Intake (269.3), Postmenopausal, Secondary Osteoporosis Fractures: None Treatments: None  ASSESSMENT: The BMD measured at AP Spine L1-L4 is 0.889 g/cm2 with a T-score of -2.5. This patient is  considered osteoporotic according to [facility]) criteria. There has been a statistically significant decrease in BMD of Left hip and no statistically significant changes in the Lumbar spine since prior exam dated 11/03/2014  ASSESSMENT: 51 y.o.  BRCA 1-2 negative [place] woman:   (1) status post left lumpectomy and sentinel lymph node biopsy December 2002 for a 1.9 cm invasive ductal carcinoma, no lymph node involvement, triple negative, treated with 4 cycles of Cytoxan and Adriamycin followed by radiation, with no evidence of disease recurrence to date.   (2) status post Right lumpectomy and axillary lymph node dissection 03/02/2012 for a T2 N3 invasive lobular breast cancer, grade 1, estrogen receptor 85% and progesterone receptor 98% positive, with HER-2 nonamplified and an MIB-1 of 9%  (3)  completed 4 cycles of docetaxel/cyclophosphamide 05/27/2012  (4)  Status post radiation therapy,completed late August 2013  (5)  she started on tamoxifen August 2013, switched to anastrozole 09/14/2014  (6)  lymphedema, right upper extremity  (a) s/p lymph node transfer (from left groin to right axilla) February 2015  (7) seroma cavity Right breast, resolved  (8)  status post right mastectomy with [facility] at [facility] in February 2015  (9) osteopenia with a T score of -2.4 on bone density scan 11/03/2014  (a) bone density1/27/2017 shows osteoporosis with a T score of -2.5.   (10) iron deficiency anemia, status post Feraheme times 08/17/2015, resolved  (a) negative colonoscopy August 2016   PLAN: [name] is now 5 years out from definitive surgery for herlocally advanced lobular breast cancer with no evidence of disease recurrence. This is very favorable.  She is tolerating the anastrozole well.The recent data suggests that 2 additional years of anastrozole, which will take [place] to October 2020, is as good as 5 additional years. In her case I strongly suggested she  continue at least for 2 more years beyond the 5 years she will complete in October of this year. She is in agreement with this.  Unfortunately anastrozole can worsen bone density and she now  Has crossed the threshold into osteoporosis.she is already on a walking program and vitamin D supplementation. We discussed multiple pharmacologic alterntives including oral bisphosphonates, zolendronate, and denosumab/Prolia. fter much discussion she decided she would like to try the Prolia and we will  set her up for that next week and then 6 months later. We discussed the possible side effects toxicities and complications including hypocalcemia, bony aches and pains, and the rare occurrence of osteonecrosis of the jaw.  I reassured her that what she is feeling between her breasts is the xiphoid process of her breast bone.  She will see me again with her second Prolia dose, which will be in August. I will see her again in a yearly basis thereafter.    02/05/2017

## 2017-02-06 ENCOUNTER — Encounter: Payer: Self-pay | Admitting: Oncology

## 2017-02-06 NOTE — Progress Notes (Signed)
Pt called inquiring about assistance for Prolia.  I informed her there is a foundation that offers copay assistance w/ the Prolia First Step program and explained her out of pocket cost will only be $25.  I will enroll pt on 02/16/17 when she signs the app.

## 2017-02-09 ENCOUNTER — Ambulatory Visit: Payer: 59

## 2017-02-16 ENCOUNTER — Ambulatory Visit (HOSPITAL_BASED_OUTPATIENT_CLINIC_OR_DEPARTMENT_OTHER): Payer: Self-pay

## 2017-02-16 VITALS — BP 151/81 | HR 80 | Temp 97.9°F | Resp 18

## 2017-02-16 DIAGNOSIS — M818 Other osteoporosis without current pathological fracture: Secondary | ICD-10-CM

## 2017-02-16 MED ORDER — DENOSUMAB 60 MG/ML ~~LOC~~ SOLN
60.0000 mg | Freq: Once | SUBCUTANEOUS | Status: DC
  Filled 2017-02-16: qty 1

## 2017-02-17 ENCOUNTER — Encounter: Payer: Self-pay | Admitting: Oncology

## 2017-02-17 NOTE — Progress Notes (Signed)
Enrolled pt in the Prolia First Step program.  Faxed signed form and activated card today.

## 2017-02-23 DIAGNOSIS — M533 Sacrococcygeal disorders, not elsewhere classified: Secondary | ICD-10-CM | POA: Diagnosis not present

## 2017-02-23 DIAGNOSIS — M7062 Trochanteric bursitis, left hip: Secondary | ICD-10-CM | POA: Diagnosis not present

## 2017-02-23 DIAGNOSIS — M545 Low back pain: Secondary | ICD-10-CM | POA: Diagnosis not present

## 2017-03-04 DIAGNOSIS — R262 Difficulty in walking, not elsewhere classified: Secondary | ICD-10-CM | POA: Diagnosis not present

## 2017-03-04 DIAGNOSIS — M545 Low back pain: Secondary | ICD-10-CM | POA: Diagnosis not present

## 2017-03-04 DIAGNOSIS — M6281 Muscle weakness (generalized): Secondary | ICD-10-CM | POA: Diagnosis not present

## 2017-03-05 DIAGNOSIS — R262 Difficulty in walking, not elsewhere classified: Secondary | ICD-10-CM | POA: Diagnosis not present

## 2017-03-05 DIAGNOSIS — M25552 Pain in left hip: Secondary | ICD-10-CM | POA: Diagnosis not present

## 2017-03-05 DIAGNOSIS — M6281 Muscle weakness (generalized): Secondary | ICD-10-CM | POA: Diagnosis not present

## 2017-03-08 ENCOUNTER — Other Ambulatory Visit: Payer: Self-pay | Admitting: Oncology

## 2017-03-12 DIAGNOSIS — R262 Difficulty in walking, not elsewhere classified: Secondary | ICD-10-CM | POA: Diagnosis not present

## 2017-03-12 DIAGNOSIS — M25552 Pain in left hip: Secondary | ICD-10-CM | POA: Diagnosis not present

## 2017-03-12 DIAGNOSIS — M6281 Muscle weakness (generalized): Secondary | ICD-10-CM | POA: Diagnosis not present

## 2017-03-13 DIAGNOSIS — M6281 Muscle weakness (generalized): Secondary | ICD-10-CM | POA: Diagnosis not present

## 2017-03-13 DIAGNOSIS — M25552 Pain in left hip: Secondary | ICD-10-CM | POA: Diagnosis not present

## 2017-03-13 DIAGNOSIS — R262 Difficulty in walking, not elsewhere classified: Secondary | ICD-10-CM | POA: Diagnosis not present

## 2017-03-16 DIAGNOSIS — M6281 Muscle weakness (generalized): Secondary | ICD-10-CM | POA: Diagnosis not present

## 2017-03-16 DIAGNOSIS — M25552 Pain in left hip: Secondary | ICD-10-CM | POA: Diagnosis not present

## 2017-03-16 DIAGNOSIS — R262 Difficulty in walking, not elsewhere classified: Secondary | ICD-10-CM | POA: Diagnosis not present

## 2017-03-25 DIAGNOSIS — M25552 Pain in left hip: Secondary | ICD-10-CM | POA: Diagnosis not present

## 2017-03-25 DIAGNOSIS — R262 Difficulty in walking, not elsewhere classified: Secondary | ICD-10-CM | POA: Diagnosis not present

## 2017-03-25 DIAGNOSIS — M6281 Muscle weakness (generalized): Secondary | ICD-10-CM | POA: Diagnosis not present

## 2017-03-31 DIAGNOSIS — M25552 Pain in left hip: Secondary | ICD-10-CM | POA: Diagnosis not present

## 2017-03-31 DIAGNOSIS — M6281 Muscle weakness (generalized): Secondary | ICD-10-CM | POA: Diagnosis not present

## 2017-03-31 DIAGNOSIS — R262 Difficulty in walking, not elsewhere classified: Secondary | ICD-10-CM | POA: Diagnosis not present

## 2017-04-03 DIAGNOSIS — R262 Difficulty in walking, not elsewhere classified: Secondary | ICD-10-CM | POA: Diagnosis not present

## 2017-04-03 DIAGNOSIS — M6281 Muscle weakness (generalized): Secondary | ICD-10-CM | POA: Diagnosis not present

## 2017-04-03 DIAGNOSIS — M25552 Pain in left hip: Secondary | ICD-10-CM | POA: Diagnosis not present

## 2017-04-09 DIAGNOSIS — M6281 Muscle weakness (generalized): Secondary | ICD-10-CM | POA: Diagnosis not present

## 2017-04-09 DIAGNOSIS — M25552 Pain in left hip: Secondary | ICD-10-CM | POA: Diagnosis not present

## 2017-04-09 DIAGNOSIS — R262 Difficulty in walking, not elsewhere classified: Secondary | ICD-10-CM | POA: Diagnosis not present

## 2017-04-10 DIAGNOSIS — M25552 Pain in left hip: Secondary | ICD-10-CM | POA: Diagnosis not present

## 2017-04-10 DIAGNOSIS — R262 Difficulty in walking, not elsewhere classified: Secondary | ICD-10-CM | POA: Diagnosis not present

## 2017-04-10 DIAGNOSIS — M6281 Muscle weakness (generalized): Secondary | ICD-10-CM | POA: Diagnosis not present

## 2017-04-14 DIAGNOSIS — R262 Difficulty in walking, not elsewhere classified: Secondary | ICD-10-CM | POA: Diagnosis not present

## 2017-04-14 DIAGNOSIS — M25552 Pain in left hip: Secondary | ICD-10-CM | POA: Diagnosis not present

## 2017-04-14 DIAGNOSIS — M6281 Muscle weakness (generalized): Secondary | ICD-10-CM | POA: Diagnosis not present

## 2017-04-16 DIAGNOSIS — M533 Sacrococcygeal disorders, not elsewhere classified: Secondary | ICD-10-CM | POA: Diagnosis not present

## 2017-04-16 DIAGNOSIS — M5416 Radiculopathy, lumbar region: Secondary | ICD-10-CM | POA: Diagnosis not present

## 2017-04-20 ENCOUNTER — Encounter: Payer: Self-pay | Admitting: Oncology

## 2017-04-21 ENCOUNTER — Telehealth: Payer: Self-pay

## 2017-04-21 NOTE — Telephone Encounter (Signed)
Pt sent email regarding her ongoing back pain.  This RN spoke with Dr Laurena Bering office in [place] and confirmed that pt had a lumbar xray done on 5/3 and that pt is scheduled for an MRI on 5/10.  Per Dr Jana Hakim have pt see him next week.  Pt made aware of these details and msg sent to scheduling.

## 2017-04-22 ENCOUNTER — Telehealth: Payer: Self-pay | Admitting: Oncology

## 2017-04-22 NOTE — Telephone Encounter (Signed)
sw pt to confirm 5/16 appt at 1200 per LOs

## 2017-04-23 ENCOUNTER — Other Ambulatory Visit: Payer: Self-pay | Admitting: Oncology

## 2017-04-23 DIAGNOSIS — M5116 Intervertebral disc disorders with radiculopathy, lumbar region: Secondary | ICD-10-CM | POA: Diagnosis not present

## 2017-04-23 DIAGNOSIS — M5416 Radiculopathy, lumbar region: Secondary | ICD-10-CM | POA: Diagnosis not present

## 2017-04-27 ENCOUNTER — Other Ambulatory Visit: Payer: Self-pay | Admitting: Oncology

## 2017-04-29 ENCOUNTER — Ambulatory Visit (HOSPITAL_BASED_OUTPATIENT_CLINIC_OR_DEPARTMENT_OTHER): Payer: 59 | Admitting: Oncology

## 2017-04-29 VITALS — BP 131/79 | HR 91 | Temp 97.8°F | Resp 20 | Ht 65.0 in | Wt 172.3 lb

## 2017-04-29 DIAGNOSIS — Z79811 Long term (current) use of aromatase inhibitors: Secondary | ICD-10-CM | POA: Diagnosis not present

## 2017-04-29 DIAGNOSIS — Z171 Estrogen receptor negative status [ER-]: Principal | ICD-10-CM

## 2017-04-29 DIAGNOSIS — M818 Other osteoporosis without current pathological fracture: Secondary | ICD-10-CM | POA: Diagnosis not present

## 2017-04-29 DIAGNOSIS — C50412 Malignant neoplasm of upper-outer quadrant of left female breast: Secondary | ICD-10-CM

## 2017-04-29 MED ORDER — TRAMADOL HCL 50 MG PO TABS: 50.0000 mg | ORAL_TABLET | Freq: Four times a day (QID) | ORAL | 0 refills | Status: AC | PRN

## 2017-04-29 NOTE — Progress Notes (Signed)
ID: [name]   DOB: August 01, 1966  MR#: [id number]  WER#:[id number]   PCP:  GYN:   SUR:  Almond Lint, MD RADONC:  Margaretmary Dys, MD OTHER:  Etter Sjogren, MD;   Celene Kras, MD (Plastic Surgery, [place]; [place] [phone]) Laren Everts, MD (Onc Surgery, [place], [place] # [phone]) Jackelyn Knife, MD ([facility]) Leary Roca M.D.  CHIEF COMPLAINT:  Hx of Bilateral Breast Cancers  CURRENT THERAPY: [Anastrozole]  HISTORY OF PRESENT ILLNESS: From the original intake note:  Kasara is an [place] woman formerly followed here for a left-sided stage I invasive ductal carcinoma, which was treated with adjuvant chemotherapy completed in 2003. She was released from followup 2012.   On 12/28/2011 she had bilateral screening mammography showing a possible area of distortion in the right breast. She was recalled for additional views 01/09/2012 and this confirmed an area of architectural distortion in the lateral aspect of the right breast measuring 2.9 cm. This was not palpable by exam. Biopsy was performed the same day and showed ([id number]) and invasive lobular breast cancer, e-cadherin negative, strongly estrogen receptor positive at 85%, progesterone receptor positive at 98%, with an MIB-1 of 9%, and no HER-2 amplification.  Patient is status post right lumpectomy and x-ray lymph node dissection 03/02/2012 for a T2 N2 invasive lobular breast carcinoma, grade 1. 10 of 10 lymph nodes were positive. Margins were close but negative.   Her subsequent history is as detailed below  INTERVAL HISTORY: [name] returns today for follow-up of her estrogen receptor positive breast cancer. As far as that is concerned everything is fine. She continues on anastrozole. She tolerates that well. Hot flashes and vaginal dryness are not a major issue. She never developed the arthralgias or myalgias that many patients can experience on this medication. She obtains it at a good price.  The problem is that she started  developing significant back pain sometime in December 2017. She's been evaluated for this by orthopedics, and she was referred to physical therapy which she did for 6 weeks, with no benefit. She had an MRI of the lumbar spine at [facility] which only shows mild arthritis, no significant stenosis or impingement and no evidence of disease recurrence.  Nevertheless she is desperate. The pain is only getting worse. She can't sleep, she can barely walk, she has to roll over to change position in bed. She is not able to work very well because of the pain. It is mostly in the lower back but also the hips, upper back, and the right side. She also complains of tingling in her fingers at times. They have been no change in bowel or bladder habits, no fever, no rash, no bleeding.  REVIEW OF SYSTEMS: A detailed review of systems today was otherwise noncontributory  PAST MEDICAL HISTORY: Past Medical History:  Diagnosis Date  . Anxiety   . Breast cancer 2002   left   . Breast cancer 2012   right  . Complication of anesthesia   . Depression   . GERD (gastroesophageal reflux disease)   . History of bilateral breast implants   . History of chemotherapy    cyclosphosphamide/ doxorubicin  . History of chemotherapy 4th:June 13,2013   taxol  . History of radiation therapy 03/30/02-05/16/02   left breast   . History of radiation therapy 06/22/12 -08/06/12   right breast  . PONV (postoperative nausea and vomiting)   . Seroma 12/2012 u/s xray   right breast/aspiration 01/19/13    PAST SURGICAL HISTORY: Past Surgical History:  Procedure Laterality Date  . AXILLARY LYMPH NODE DISSECTION  03/02/2012   Procedure: AXILLARY LYMPH NODE DISSECTION;  Surgeon: Shann Medal, MD;  Location: [facility];  Service: General;  Laterality: Right;  . BREAST ENHANCEMENT SURGERY  2003   bilateral  . BREAST LUMPECTOMY  11/2001   left  . BREAST LUMPECTOMY W/ NEEDLE LOCALIZATION  02/05/2012   right/ slnbx  . mirena IUD      still in place as of 06/09/12  . PORTACATH PLACEMENT    . Portacath removal    . SENTINEL LYMPH NODE BIOPSY  11/2001   left axillary  . TONSILLECTOMY AND ADENOIDECTOMY      FAMILY HISTORY Family History  Problem Relation Age of Onset  . Cancer Mother        breast  . Cancer Paternal Aunt        breast  . Cancer Cousin        breast ca 1's 1st cousin    Gynecologic history:  Menarche age 70, she is GX P0. She is currently using a Mirena implant  and is considering bilateral salpingo-oophorectomy.  Social history:  (updated 03/22/2014)  She works for [employer] in [place]. She is divorced and lives by herself, with 2 birds, an [place] and a cockateal. She is not a Ambulance person.   ADVANCED DIRECTIVES: in place   HEALTH MAINTENANCE:  (updated 03/22/2014) Social History  Substance Use Topics  . Smoking status: Never Smoker  . Smokeless tobacco: Never Used  . Alcohol use Yes     Comment: social occasio0nally 1-2      Colonoscopy: 07/20/2015  PAP: Not on file  Bone density: Never  Lipid panel: Not on file    Allergies  Allergen Reactions  . Rocuronium Anaphylaxis    Current Outpatient Prescriptions  Medication Sig Dispense Refill  . anastrozole (ARIMIDEX) 1 MG tablet TAKE 1 TABLET (1 MG TOTAL) BY MOUTH DAILY. 90 tablet 2  . cetirizine (ZYRTEC) 10 MG tablet Take 10 mg by mouth as needed.    . cholecalciferol (VITAMIN D) 1000 UNITS tablet Take 1 tablet (1,000 Units total) by mouth daily. 100 tablet 4  . ibuprofen (ADVIL,MOTRIN) 200 MG tablet Take 200 mg by mouth every 6 (six) hours as needed.    Marland Kitchen LORazepam (ATIVAN) 0.5 MG tablet Take 1 tablet (0.5 mg total) by mouth at bedtime as needed for anxiety or sleep. 30 tablet 0  . Lorcaserin HCl (BELVIQ PO) Take by mouth.    . Multiple Vitamin (MULTIVITAMIN) tablet Take 1 tablet by mouth daily.    . naproxen (NAPROSYN) 250 MG tablet Take by mouth 2 (two) times daily with a meal.    . omeprazole  (PRILOSEC) 20 MG capsule Take 20 mg by mouth daily.    . traMADol (ULTRAM) 50 MG tablet Take 1 tablet (50 mg total) by mouth every 6 (six) hours as needed. 60 tablet 0   No current facility-administered medications for this visit.     OBJECTIVE: Middle-aged white woman   Vitals:   04/29/17 1200  BP: 131/79  Pulse: 91  Resp: 20  Temp: 97.8 F (36.6 C)     Body mass index is 28.67 kg/m.    ECOG FS: 0  Filed Weights   04/29/17 1200  Weight: 172 lb 4.8 oz (78.2 kg)   Sclerae unicteric, pupils round and equal Oropharynx clear and moist No cervical or supraclavicular adenopathy Lungs no rales or rhonchi Heart regular rate and  rhythm Abd soft, nontender, positive bowel sounds MSK mild diffuse spinal tenderness, no upper extremity lymphedema Neuro: nonfocal, well oriented, appropriate affect Breasts: Deferred  LAB RESULTS: Lab Results  Component Value Date   WBC 9.2 02/05/2017   NEUTROABS 4.8 02/05/2017   HGB 14.7 02/05/2017   HCT 43.3 02/05/2017   MCV 89.1 02/05/2017   PLT 227 02/05/2017      Chemistry      Component Value Date/Time   NA 141 02/05/2017 1030   K 4.0 02/05/2017 1030   CL 106 07/24/2014 0818   CL 105 04/05/2013 1157   CO2 26 02/05/2017 1030   BUN 18.8 02/05/2017 1030   CREATININE 0.8 02/05/2017 1030      Component Value Date/Time   CALCIUM 9.8 02/05/2017 1030   ALKPHOS 91 02/05/2017 1030   AST 11 02/05/2017 1030   ALT 20 02/05/2017 1030   BILITOT 0.38 02/05/2017 1030      STUDIES: She had an MRI of the lumbar spine at [facility] on 04/23/2017 which was negative. I'm copying it below.  LUMBAR SPINE MRI WITHOUT CONTRAST  CLINICAL INFORMATION: SCIATICA, AND/OR RADICULOPATHY, <6WKS, , M54.16 Radiculopathy, lumbar region.  PROCEDURE: Routine lumbar spine MRI without contrast.  COMPARISON: None  Technique: Multiplanar, multiecho MR imaging of the lumbar spine was performed, including T1-weighted and fluid sensitive  sequences.  FINDINGS:  Normal lumbar lordosis. The lumbar vertebral body heights are maintained. The conus medullaris is positioned at the L1 level. L2 inferior endplate Schmorl's node. Otherwise, no signal abnormalities within the bone marrow of the lumbosacral segments.  Numbering of the lumbar segments is predicated upon the presence of 5 nonrib-bearing lumbar type vertebrae. The most superior nonrib-bearing vertebral body is labeled as L1.  T12-L1: Disc height preserved. No facet hypertrophy. No spinal canal or neuroforaminal stenosis.  L1-L2: Disc height preserved.No facet hypertrophy.No spinal canal or neuroforaminal stenosis.   L2-L3: Minimal circumferential disc bulge, asymmetric to the left, without significant impress upon the ventral thecal sac. No facet hypertrophy.No spinal canal or neuroforaminal stenosis.   L3-L4: Disc height preserved.No facet hypertrophy.No spinal canal or neuroforaminal stenosis.   L4-L5: Disc height preserved.No facet hypertrophy.No spinal canal or neuroforaminal stenosis.   L5-S1: Disc height preserved.No facet hypertrophy.No spinal canal or neuroforaminal stenosis.   Sacroiliac joints: Mild degenerative changes bilaterally.  Incidentally noted circumaortic left renal vein. Incidentally noted punctate right interpolar renal cyst.  IMPRESSION:  1. No spinal canal or neuroforaminal stenosis.   Electronically Reviewed ZO:XWRUEA Park Pope, MD, [facility] Electronically Reviewed on:04/23/2017 3:04 PM  I have reviewed the images and concur with the above findings.  Electronically Signed VW:[id number] Michela Pitcher, MD, [facility] Electronically Signed on:04/23/2017 5:40 PM  ASSESSMENT: 51 y.o.  BRCA 1-2 negative [place] woman:   (1) status post left lumpectomy and sentinel lymph node biopsy December 2002 for a 1.9 cm invasive ductal carcinoma, no lymph node involvement, triple negative, treated with 4 cycles  of Cytoxan and Adriamycin followed by radiation, with no evidence of disease recurrence to date.   (2) status post Right lumpectomy and axillary lymph node dissection 03/02/2012 for a T2 N3 invasive lobular breast cancer, grade 1, estrogen receptor 85% and progesterone receptor 98% positive, with HER-2 nonamplified and an MIB-1 of 9%  (3)  completed 4 cycles of docetaxel/cyclophosphamide 05/27/2012  (4)  Status post radiation therapy,completed late August 2013  (5)  she started on tamoxifen August 2013, switched to anastrozole 09/14/2014  (6)  lymphedema, right upper extremity  (a) s/p lymph node  transfer (from left groin to right axilla) February 2015  (7) seroma cavity Right breast, resolved  (8)  status post right mastectomy with TRAM reconstruction at [facility] in 01/25/2014 with benign pathology (SP [phone])  (9) osteopenia with a T score of -2.4 on bone density scan 11/03/2014  (a) bone density11/27/2017 shows osteoporosis with a T score of -2.5.   (10) iron deficiency anemia, status post Feraheme times 08/17/2015, resolved  (a) negative colonoscopy August 2016  (11) significant back pain was evaluated with MRI of the lumbar region at [facility] on 04/23/2017, showing no evidence of malignancy and no spinal canal or neuroforaminal stenosis.   PLAN: [name] is becoming disabled because of her pain. She is in and wish, unable to think well, finding it difficult to work. She already went through 6 weeks of rehabilitation which she does not feel helped anything. The pain is worse.  We reviewed the lumbar MRI obtained at [facility]. This shows only age appropriate arthritis. I think the pain she has may be related to arthritis, may be related to her prior breast surgeries, but what it does not seem to be related to his recurrent breast cancer.  I looked in the controlled substances database. She is on no narcotics. She has been receiving lorazepam 30 tablets monthly through her [facility].  I suggested Cymbalta but she tells me she has tried Wellbutrin, Effexor, Celexa, Zoloft, and BuSpar, and all of them make her "crazy", unable to think straight, and unable to work.  I wrote her for tramadol, 50 mg to take 4 times a day as needed, 60 tablets with no refills. I also suggested she continue to take her Motrin or Naprosyn, either, up to 3 times a day with food. She understands the tramadol already includes Tylenol so she will not double up on that.  I set her up for an MRI of the thoracic spine. She understands I expect this to be similar to her lumbar MRI, namely no evidence of cancer, some arthritis.  I'm making a referral for her with Dr. Primus Bravo at the pain [facility] in [place] for further evaluation and treatment.  Assuming the thoracic MRI is as I expect, she will return to see me in 2 months just to make sure she is feeling better. Otherwise we will resume yearly follow-up next February  I am stopping her anastrozole for now in case that is contributing to her problem.    04/29/2017

## 2017-05-01 ENCOUNTER — Emergency Department (HOSPITAL_COMMUNITY)
Admission: EM | Admit: 2017-05-01 | Discharge: 2017-05-02 | Disposition: A | Discharge status: Home / Self Care | Payer: 59 | Attending: Emergency Medicine | Admitting: Emergency Medicine

## 2017-05-01 ENCOUNTER — Other Ambulatory Visit: Payer: Self-pay | Admitting: *Deleted

## 2017-05-01 ENCOUNTER — Telehealth: Payer: Self-pay | Admitting: Medical Oncology

## 2017-05-01 DIAGNOSIS — M546 Pain in thoracic spine: Secondary | ICD-10-CM | POA: Insufficient documentation

## 2017-05-01 DIAGNOSIS — R200 Anesthesia of skin: Secondary | ICD-10-CM | POA: Insufficient documentation

## 2017-05-01 DIAGNOSIS — M549 Dorsalgia, unspecified: Secondary | ICD-10-CM

## 2017-05-01 DIAGNOSIS — M545 Low back pain: Secondary | ICD-10-CM | POA: Insufficient documentation

## 2017-05-01 DIAGNOSIS — Z853 Personal history of malignant neoplasm of breast: Secondary | ICD-10-CM | POA: Insufficient documentation

## 2017-05-01 DIAGNOSIS — R202 Paresthesia of skin: Secondary | ICD-10-CM | POA: Diagnosis not present

## 2017-05-01 DIAGNOSIS — M5030 Other cervical disc degeneration, unspecified cervical region: Secondary | ICD-10-CM | POA: Diagnosis not present

## 2017-05-01 LAB — I-STAT CHEM 8, ED
BUN: 21 mg/dL — AB (ref 6–20)
CREATININE: 0.7 mg/dL (ref 0.44–1.00)
Calcium, Ion: 1.13 mmol/L — ABNORMAL LOW (ref 1.15–1.40)
Chloride: 104 mmol/L (ref 101–111)
GLUCOSE: 90 mg/dL (ref 65–99)
HCT: 50 % — ABNORMAL HIGH (ref 36.0–46.0)
HEMOGLOBIN: 17 g/dL — AB (ref 12.0–15.0)
POTASSIUM: 4.6 mmol/L (ref 3.5–5.1)
Sodium: 139 mmol/L (ref 135–145)
TCO2: 30 mmol/L (ref 0–100)

## 2017-05-01 MED ORDER — HYDROMORPHONE HCL 1 MG/ML IJ SOLN
1.0000 mg | Freq: Once | INTRAMUSCULAR | Status: AC
  Administered 2017-05-01: 1 mg via INTRAVENOUS
  Filled 2017-05-01: qty 1

## 2017-05-01 MED ORDER — HYDROMORPHONE HCL 1 MG/ML IJ SOLN
1.0000 mg | Freq: Once | INTRAMUSCULAR | Status: AC
  Administered 2017-05-02: 1 mg via INTRAVENOUS
  Filled 2017-05-01: qty 1

## 2017-05-01 NOTE — Telephone Encounter (Signed)
She looked online and [facility] is in network and appt are in the next 2 weeks. Can you refer her to one of the providers at [facility]?

## 2017-05-01 NOTE — Telephone Encounter (Signed)
This RN called pt per her concerns - she states pain has not been controlled with tramadol prescribed by MD " the pain is so bad I could not go into work and can barely get out of bed "  She states pain is interfering with ambulation.  This RN discussed concerns per above and need for acute evaluation which may best be rendered thru the ER.   Pt will proceed to the [facility] ER.  This RN called report to the ER- was requested to verify how late MRI would be available for possible need for pt to go to [facility].  Per MRI - they will be available until 8 pm.  Called and discussed above with pt who states she is on her way now to the ER at [facility].

## 2017-05-01 NOTE — ED Triage Notes (Signed)
Pt has been advised to come in from cancer center call center for evaluation of her severe back pain with numbness to LEs. States she was advised there maybe a need for an MRIto r/o mets to the bone.

## 2017-05-01 NOTE — ED Notes (Addendum)
Pt verbalizes worsening lower back pain since December. Pt continues to verbalize pain radiates to left buttock. Pt denies numbness/tingling to legs. Pt continues to verbalizes tingling to right hand finger tips; pt denies sensation difference between right and left hand/finger tips with compared to one another. Pt hx of right mastectomy. Pt denies GU symptoms. Pt prefers to stand/walk; pain worse with sitting/laying.

## 2017-05-01 NOTE — Telephone Encounter (Signed)
Asking if there is any update on referral. I told her no. I instructed her to go to ED if her pain is intolerable. She is requesting that Dr Jana Hakim call her. Forwarded.

## 2017-05-01 NOTE — Telephone Encounter (Signed)
Dr Primus Bravo is no longer at pain clinic. Next available appt with another provider is July. " I can't wait that long. Should I go to ED?"

## 2017-05-01 NOTE — ED Notes (Signed)
Pt BIB [facility] from [facility] to have MRI preformed. Pt able to ambulate from stretcher to room, she does not want to lay on bed r/t pain in her back. Pt reports she has to get up and down from chair to get comfortable. She states that she has had numbness in her left butt in one spot that does not radiate and tingling in her fingers on the right hand, pt also reports right sided rib pain when coughing or sneezing. Pt has hx of breast cancer twice but is currently not receiving treatment. Has compression sleeve on right arm for lymphedema.

## 2017-05-01 NOTE — Telephone Encounter (Signed)
Askign about pain referral .Information given.

## 2017-05-01 NOTE — ED Provider Notes (Signed)
[facility] DEPT Provider Note   CSN: [id number] Arrival date & time: 05/01/17  1658     History   Chief Complaint Chief Complaint  Patient presents with  . Numbness    HPI Christina Blackwell is a 51 y.o. female.  The history is provided by the patient. No language interpreter was used.    Christina Blackwell is a 51 y.o. female who presents to the Emergency Department complaining of back pain, numbness.  She reports lumbar back pain that started back in December 2017. Pain has been progressively worsening over the last several months. Her pain is throughout midline lumbar spine and radiates to her lower thoracic region as well as diffusely in the low back. Pain is worse with coughing and movement. She has seen orthopedics for this and has gone through physical therapy and had an outpatient MRI of her lumbar spine performed on May 10 that showed no clear source of her symptoms. Over the last few days her pain has progressively worsened and now she has tingling in the digits of her right hand. She denies any neck pain, chest pain, bowel pain, nausea, vomiting, urinary problems. She also has numbness to her left gluteal region that has been ongoing for the last several months.  Past Medical History:  Diagnosis Date  . Anxiety   . Breast cancer 2002   left   . Breast cancer 2012   right  . Complication of anesthesia   . Depression   . GERD (gastroesophageal reflux disease)   . History of bilateral breast implants   . History of chemotherapy    cyclosphosphamide/ doxorubicin  . History of chemotherapy 4th:June 13,2013   taxol  . History of radiation therapy 03/30/02-05/16/02   left breast   . History of radiation therapy 06/22/12 -08/06/12   right breast  . PONV (postoperative nausea and vomiting)   . Seroma 12/2012 u/s xray   right breast/aspiration 01/19/13    Patient Active Problem List   Diagnosis Date Noted  . Osteoporosis 02/05/2017  . Iron deficiency anemia 05/05/2015  .  Herpes 03/28/2015  . 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 Hot flashes due to tamoxifen 07/12/2013  . 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 Seroma 01/19/2013  . Anxiety 01/10/2013  . History of radiation therapy   . Malignant neoplasm of upper-outer quadrant of left breast in female, estrogen receptor negative ([place]) 02/12/2012    Past Surgical History:  Procedure Laterality Date  . AXILLARY LYMPH NODE DISSECTION  03/02/2012   Procedure: AXILLARY LYMPH NODE DISSECTION;  Surgeon: Shann Medal, MD;  Location: [facility];  Service: General;  Laterality: Right;  . BREAST ENHANCEMENT SURGERY  2003   bilateral  . BREAST LUMPECTOMY  11/2001   left  . BREAST LUMPECTOMY W/ NEEDLE LOCALIZATION  02/05/2012   right/ slnbx  . mirena IUD     still in place as of 06/09/12  . PORTACATH PLACEMENT    . Portacath removal    . SENTINEL LYMPH NODE BIOPSY  11/2001   left axillary  . TONSILLECTOMY AND ADENOIDECTOMY      OB History    No data available       Home Medications    Prior to Admission medications   Medication Sig Start Date End Date Taking? Authorizing Provider  acetaminophen (TYLENOL) 500 MG tablet Take 500 mg by mouth every 6 (six) hours as needed for mild pain or moderate pain.   Yes [provider]  buPROPion (  WELLBUTRIN XL) 300 MG 24 hr tablet Take 300 mg by mouth daily. 02/02/17 02/02/18 Yes [provider]  ibuprofen (ADVIL,MOTRIN) 200 MG tablet Take 200 mg by mouth every 6 (six) hours as needed.   Yes [provider]  LORazepam (ATIVAN) 0.5 MG tablet Take 1 tablet (0.5 mg total) by mouth at bedtime as needed for anxiety or sleep. 10/23/15  Yes Magrinat, Virgie Dad, MD  Lorcaserin HCl ER (BELVIQ XR) 20 MG TB24 Take 20 mg by mouth daily. 02/02/17  Yes [provider]  Multiple Vitamin (MULTIVITAMIN) tablet Take 1 tablet by mouth daily.   Yes [provider]  omeprazole (PRILOSEC) 20 MG capsule Take 20 mg by mouth daily.   Yes [provider]  traMADol (ULTRAM) 50 MG tablet Take 1 tablet (50 mg total) by mouth every 6 (six) hours as needed. 04/29/17  Yes Magrinat, Virgie Dad, MD  anastrozole (ARIMIDEX) 1 MG tablet TAKE 1 TABLET (1 MG TOTAL) BY MOUTH DAILY. 03/09/17   Magrinat, Virgie Dad, MD  cholecalciferol (VITAMIN D) 1000 UNITS tablet Take 1 tablet (1,000 Units total) by mouth daily. Patient not taking: Reported on 05/01/2017 05/05/15   Magrinat, Virgie Dad, MD    Family History Family History  Problem Relation Age of Onset  . Cancer Mother        breast  . Cancer Paternal Aunt        breast  . Cancer Cousin        breast ca 82's 1st cousin    Social History Social History  Substance Use Topics  . Smoking status: Never Smoker  . Smokeless tobacco: Never Used  . Alcohol use Yes     Comment: social occasio0nally 1-2      Allergies   Rocuronium   Review of Systems Review of Systems  All other systems reviewed and are negative.    Physical Exam Updated Vital Signs BP (!) 137/94 (BP Location: Left Arm)   Pulse 91   Temp 97.8 F (36.6 C)   Resp 16   Ht 5\' 4"  (1.626 m)   Wt 170 lb (77.1 kg)   SpO2 96%   BMI 29.18 kg/m   Physical Exam  Constitutional: She is oriented to person, place, and time. She appears well-developed and well-nourished.  Uncomfortable appearing  HENT:  Head: Normocephalic and atraumatic.  Cardiovascular: Normal rate and regular rhythm.   No murmur heard. Pulmonary/Chest: Effort normal and breath sounds normal. No respiratory distress.  Abdominal: Soft. There is no tenderness. There is no rebound and no guarding.  Musculoskeletal: She exhibits no edema.  Significant tenderness to palpation throughout the lower thoracic and diffuse lumbar region without any discrete bony tenderness.  Neurological: She is alert and oriented to person, place, and time.  5 out of 5 strength in all 4 extremities. Decreased sensation to light touch throughout the right hand. Normal gait.  Skin:  Skin is warm and dry.  Psychiatric: She has a normal mood and affect. Her behavior is normal.  Nursing note and vitals reviewed.    ED Treatments / Results  Labs (all labs ordered are listed, but only abnormal results are displayed) Labs Reviewed  I-STAT CHEM 8, ED - Abnormal; Notable for the following:       Result Value   BUN 21 (*)    Calcium, Ion 1.13 (*)    Hemoglobin 17.0 (*)    HCT 50.0 (*)    All other components within normal limits  EKG  EKG Interpretation None       Radiology No results found.  Procedures Procedures (including critical care time)  Medications Ordered in ED Medications  HYDROmorphone (DILAUDID) injection 1 mg (1 mg Intravenous Given 05/01/17 2018)     Initial Impression / Assessment and Plan / ED Course  I have reviewed the triage vital signs and the nursing notes.  Pertinent labs & imaging results that were available during my care of the patient were reviewed by me and considered in my medical decision making (see chart for details).     Patient with history of cancer here for progressive low back pain with paresthesias to the right hand. She has seen her oncologist for the symptoms with plan to obtain  thoracic MRI to rule out metastatic process. She has not yet received her outpatient imaging. Will attempt to obtain MRI thoracic as well as cervical spine given her paresthesias in her right hand. Unable to get MRI at this facility this evening. Plan to transfer [facility] for [facility]. Discussed with Dr. Laverta Baltimore who accepted the patient in transfer. D/w patient importance of transfer for further imaging or workup and she is agreement with plan.  Final Clinical Impressions(s) / ED Diagnoses   Final diagnoses:  Back pain    New Prescriptions New Prescriptions   No medications on file     Quintella Reichert, MD 05/01/17 2042

## 2017-05-02 ENCOUNTER — Other Ambulatory Visit: Payer: Self-pay | Admitting: Oncology

## 2017-05-02 ENCOUNTER — Encounter: Payer: Self-pay | Admitting: Oncology

## 2017-05-02 ENCOUNTER — Emergency Department (HOSPITAL_COMMUNITY): Payer: 59

## 2017-05-02 DIAGNOSIS — M5030 Other cervical disc degeneration, unspecified cervical region: Secondary | ICD-10-CM | POA: Diagnosis not present

## 2017-05-02 DIAGNOSIS — M549 Dorsalgia, unspecified: Secondary | ICD-10-CM | POA: Diagnosis not present

## 2017-05-02 MED ORDER — GADOBENATE DIMEGLUMINE 529 MG/ML IV SOLN
15.0000 mL | Freq: Once | INTRAVENOUS | Status: AC | PRN
  Administered 2017-05-02: 15 mL via INTRAVENOUS

## 2017-05-02 MED ORDER — ONDANSETRON 4 MG PO TBDP
4.0000 mg | ORAL_TABLET | Freq: Once | ORAL | Status: AC
  Administered 2017-05-02: 4 mg via ORAL
  Filled 2017-05-02: qty 1

## 2017-05-02 MED ORDER — PANTOPRAZOLE SODIUM 40 MG PO TBEC
40.0000 mg | DELAYED_RELEASE_TABLET | Freq: Every day | ORAL | Status: DC
  Administered 2017-05-02: 40 mg via ORAL
  Filled 2017-05-02: qty 1

## 2017-05-02 MED ORDER — HYDROCODONE-ACETAMINOPHEN 5-325 MG PO TABS: 1.0000 | ORAL_TABLET | Freq: Four times a day (QID) | ORAL | 0 refills | Status: AC | PRN

## 2017-05-02 MED ORDER — GI COCKTAIL ~~LOC~~
30.0000 mL | Freq: Once | ORAL | Status: AC
  Administered 2017-05-02: 30 mL via ORAL
  Filled 2017-05-02: qty 30

## 2017-05-02 MED ORDER — HYDROCODONE-ACETAMINOPHEN 5-325 MG PO TABS
2.0000 | ORAL_TABLET | Freq: Once | ORAL | Status: AC
  Administered 2017-05-02: 2 via ORAL
  Filled 2017-05-02: qty 2

## 2017-05-02 MED ORDER — METHOCARBAMOL 500 MG PO TABS: 500.0000 mg | ORAL_TABLET | Freq: Two times a day (BID) | ORAL | 0 refills | Status: AC

## 2017-05-02 NOTE — ED Notes (Signed)
Pt transported to MRI via wheelchair.

## 2017-05-02 NOTE — ED Notes (Signed)
Pt back from MRI. Pain returned at this time.  6/10.  Pt inquiring about leaving/discontinuing IV.

## 2017-05-02 NOTE — ED Notes (Signed)
Pt started vomiting when given Vicodin.  Jarrett Soho, [place] aware.  Zofran ODT ordered

## 2017-05-02 NOTE — ED Provider Notes (Signed)
[facility] DEPT Provider Note   CSN: [id number] Arrival date & time: 05/01/17  1658     History   Chief Complaint Chief Complaint  Patient presents with  . Numbness    HPI Christina Blackwell is a 51 y.o. female with a hx of Breast cancer 2, depression, GERD presents to the Emergency Department complaining of gradual, persistent, progressively worsening back pain onset several months ago. Patient reports persistent worsening. She has been evaluated by her oncologist for this within normal lumbar MRI. She reports approximately 2 weeks ago she began to develop paresthesias of the right hand. She reports they've been progressive along with the pain in her back. She reports today when she got out of bed the pain in her thoracic spine was so bad she had difficulty standing. She reports discussing the situation with her oncologist and then deciding to present to the emergency room for further evaluation. Patient reports that when she coughs or sneezes she has severe pain in her upper back. She denies loss of bowel or bladder control, gait disturbance, weakness in her legs. She reports some associated paresthesias and numbness of her buttocks left greater than right present for several months. She reports that lying flat worsens the symptoms. Slow and gentle movement improves them but nothing makes the pain go away. Patient reports no weakness in her upper extremities. She denies headache, vision changes, neck pain, speech difficulty.   The history is provided by the patient and medical records. No language interpreter was used.    Past Medical History:  Diagnosis Date  . Anxiety   . Breast cancer 2002   left   . Breast cancer 2012   right  . Complication of anesthesia   . Depression   . GERD (gastroesophageal reflux disease)   . History of bilateral breast implants   . History of chemotherapy    cyclosphosphamide/ doxorubicin  . History of chemotherapy 4th:June 13,2013   taxol  .  History of radiation therapy 03/30/02-05/16/02   left breast   . History of radiation therapy 06/22/12 -08/06/12   right breast  . PONV (postoperative nausea and vomiting)   . Seroma 12/2012 u/s xray   right breast/aspiration 01/19/13    Patient Active Problem List   Diagnosis Date Noted  . Osteoporosis 02/05/2017  . Iron deficiency anemia 05/05/2015  . Herpes 03/28/2015  . 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 Hot flashes due to tamoxifen 07/12/2013  . 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 Seroma 01/19/2013  . Anxiety 01/10/2013  . History of radiation therapy   . Malignant neoplasm of upper-outer quadrant of left breast in female, estrogen receptor negative ([facility]) 02/12/2012    Past Surgical History:  Procedure Laterality Date  . AXILLARY LYMPH NODE DISSECTION  03/02/2012   Procedure: AXILLARY LYMPH NODE DISSECTION;  Surgeon: Shann Medal, MD;  Location: [facility];  Service: General;  Laterality: Right;  . BREAST ENHANCEMENT SURGERY  2003   bilateral  . BREAST LUMPECTOMY  11/2001   left  . BREAST LUMPECTOMY W/ NEEDLE LOCALIZATION  02/05/2012   right/ slnbx  . mirena IUD     still in place as of 06/09/12  . PORTACATH PLACEMENT    . Portacath removal    . SENTINEL LYMPH NODE BIOPSY  11/2001   left axillary  . TONSILLECTOMY AND ADENOIDECTOMY      OB History    No data available       Home Medications    Prior  to Admission medications   Medication Sig Start Date End Date Taking? Authorizing Provider  acetaminophen (TYLENOL) 500 MG tablet Take 500 mg by mouth every 6 (six) hours as needed for mild pain or moderate pain.   Yes [provider]  buPROPion (WELLBUTRIN XL) 300 MG 24 hr tablet Take 300 mg by mouth daily. 02/02/17 02/02/18 Yes [provider]  ibuprofen (ADVIL,MOTRIN) 200 MG tablet Take 200 mg by mouth every 6 (six) hours as needed.   Yes [provider]  LORazepam (ATIVAN) 0.5 MG tablet Take 1 tablet (0.5 mg total) by mouth at  bedtime as needed for anxiety or sleep. 10/23/15  Yes Magrinat, Virgie Dad, MD  Lorcaserin HCl ER (BELVIQ XR) 20 MG TB24 Take 20 mg by mouth daily. 02/02/17  Yes [provider]  Multiple Vitamin (MULTIVITAMIN) tablet Take 1 tablet by mouth daily.   Yes [provider]  omeprazole (PRILOSEC) 20 MG capsule Take 20 mg by mouth daily.   Yes [provider]  traMADol (ULTRAM) 50 MG tablet Take 1 tablet (50 mg total) by mouth every 6 (six) hours as needed. 04/29/17  Yes Magrinat, Virgie Dad, MD  anastrozole (ARIMIDEX) 1 MG tablet TAKE 1 TABLET (1 MG TOTAL) BY MOUTH DAILY. 03/09/17   Magrinat, Virgie Dad, MD  cholecalciferol (VITAMIN D) 1000 UNITS tablet Take 1 tablet (1,000 Units total) by mouth daily. Patient not taking: Reported on 05/01/2017 05/05/15   Magrinat, Virgie Dad, MD  HYDROcodone-acetaminophen (NORCO/VICODIN) 5-325 MG tablet Take 1-2 tablets by mouth every 6 (six) hours as needed for moderate pain or severe pain. 05/02/17   Giomar Gusler, Jarrett Soho, PA-C  methocarbamol (ROBAXIN) 500 MG tablet Take 1 tablet (500 mg total) by mouth 2 (two) times daily. 05/02/17   Fletcher Ostermiller, Jarrett Soho, PA-C    Family History Family History  Problem Relation Age of Onset  . Cancer Mother        breast  . Cancer Paternal Aunt        breast  . Cancer Cousin        breast ca 38's 1st cousin    Social History Social History  Substance Use Topics  . Smoking status: Never Smoker  . Smokeless tobacco: Never Used  . Alcohol use Yes     Comment: social occasio0nally 1-2      Allergies   Rocuronium   Review of Systems Review of Systems  Constitutional: Negative for chills and fever.  Eyes: Negative for visual disturbance.  Respiratory: Negative for shortness of breath.   Cardiovascular: Negative for chest pain.  Gastrointestinal: Negative for abdominal pain, nausea and vomiting.  Musculoskeletal: Positive for arthralgias and back pain.  Skin: Negative for rash and wound.  Neurological:  Positive for numbness. Negative for weakness and headaches.  Psychiatric/Behavioral: The patient is nervous/anxious.   All other systems reviewed and are negative.    Physical Exam Updated Vital Signs BP (!) 129/94 (BP Location: Left Arm)   Pulse 86   Temp 97.6 F (36.4 C)   Resp 20   Ht 5\' 4"  (1.626 m)   Wt 170 lb (77.1 kg)   SpO2 100%   BMI 29.18 kg/m   Physical Exam  Constitutional: She appears well-developed and well-nourished. No distress.  HENT:  Head: Normocephalic.  Eyes: Conjunctivae are normal. No scleral icterus.  Neck: Normal range of motion.  Cardiovascular: Normal rate and intact distal pulses.   Pulmonary/Chest: Effort normal.  Musculoskeletal: Normal range of motion.  Compression sleeve to the right arm TTP  to the right SI joint No midline or paraspinal tenderness  Neurological: She is alert.  Mental Status:  Alert, oriented, thought content appropriate, able to give a coherent history. Speech fluent without evidence of aphasia. Able to follow 2 step commands without difficulty.  Cranial Nerves:  II:  pupils equal, round, reactive to light III,IV, VI: ptosis not present, extra-ocular motions intact bilaterally  V,VII: smile symmetric, facial light touch sensation equal VIII: hearing grossly normal to voice  X: uvula elevates symmetrically  XI: bilateral shoulder shrug symmetric and strong XII: midline tongue extension without fassiculations Motor:  Normal tone. 5/5 in upper and lower extremities bilaterally including strong and equal grip strength and dorsiflexion/plantar flexion Sensory: light touch decreased in the right hand; normal in the LUE and BLLE.  Gait: normal gait and balance  Skin: Skin is warm and dry.  Psychiatric: Her mood appears anxious.  Nursing note and vitals reviewed.    ED Treatments / Results  Labs (all labs ordered are listed, but only abnormal results are displayed) Labs Reviewed  I-STAT CHEM 8, ED - Abnormal; Notable  for the following:       Result Value   BUN 21 (*)    Calcium, Ion 1.13 (*)    Hemoglobin 17.0 (*)    HCT 50.0 (*)    All other components within normal limits     Radiology Mr Cervical Spine W Or Wo Contrast  Result Date: 05/02/2017 CLINICAL DATA:  Worsening back pain radiating to the left buttocks and the right hand. History of breast cancer. EXAM: MRI CERVICAL SPINE WITH AND WITHOUT CONTRAST MRI THORACIC SPINE WITH AND WITHOUT CONTRAST TECHNIQUE: Multiplanar and multiecho pulse sequences of the cervical and thoracic spine were obtained with and without intravenous contrast. CONTRAST: 15 ML MULTIHANCE COMPARISON:  None. FINDINGS: MRI CERVICAL SPINE FINDINGS Alignment: No subluxation. Mild reversal of the normal cervical lordosis. Vertebrae: No fracture, evidence of discitis, or bone lesion. Cord: Normal signal and morphology. Posterior Fossa, vertebral arteries, paraspinal tissues: Negative. Disc levels: C1-C2: Normal. C2-C3: Normal disc space and facets. No spinal canal or neuroforaminal stenosis. C3-C4: Small central disc protrusion effaces the ventral thecal sac and indents the spinal cord. No spinal canal stenosis. No neural foraminal stenosis. C4-C5: Small central disc protrusion narrows the ventral thecal sac. No spinal canal or neural foraminal stenosis. C5-C6: Disc desiccation without spinal canal or neuroforaminal stenosis. C6-C7: Disc desiccation without spinal canal or neuroforaminal stenosis. C7-T1: Normal disc space and facets. No spinal canal or neuroforaminal stenosis. MRI THORACIC SPINE FINDINGS Alignment:  Normal Vertebrae: There are hemangiomata within the T5 and T9 vertebral bodies. No low T1 weighted signal lesions. No contrast-enhancing lesions. Cord:  Normal Paraspinal and other soft tissues: Negative. Disc levels: No disc herniation, spinal canal stenosis or neural foraminal narrowing. IMPRESSION: 1. No acute abnormality of the cervical or thoracic spine. No spinal canal or  neural foraminal stenosis. 2. No contrast-enhancing lesions. 3. Mild midcervical degenerative disc disease without stenosis. Electronically Signed   By: Ulyses Jarred M.D.   On: 05/02/2017 02:35   Mr Thoracic Spine W Wo Contrast  Result Date: 05/02/2017 CLINICAL DATA:  Worsening back pain radiating to the left buttocks and the right hand. History of breast cancer. EXAM: MRI CERVICAL SPINE WITH AND WITHOUT CONTRAST MRI THORACIC SPINE WITH AND WITHOUT CONTRAST TECHNIQUE: Multiplanar and multiecho pulse sequences of the cervical and thoracic spine were obtained with and without intravenous contrast. CONTRAST: 15 ML MULTIHANCE COMPARISON:  None. FINDINGS: MRI CERVICAL SPINE  FINDINGS Alignment: No subluxation. Mild reversal of the normal cervical lordosis. Vertebrae: No fracture, evidence of discitis, or bone lesion. Cord: Normal signal and morphology. Posterior Fossa, vertebral arteries, paraspinal tissues: Negative. Disc levels: C1-C2: Normal. C2-C3: Normal disc space and facets. No spinal canal or neuroforaminal stenosis. C3-C4: Small central disc protrusion effaces the ventral thecal sac and indents the spinal cord. No spinal canal stenosis. No neural foraminal stenosis. C4-C5: Small central disc protrusion narrows the ventral thecal sac. No spinal canal or neural foraminal stenosis. C5-C6: Disc desiccation without spinal canal or neuroforaminal stenosis. C6-C7: Disc desiccation without spinal canal or neuroforaminal stenosis. C7-T1: Normal disc space and facets. No spinal canal or neuroforaminal stenosis. MRI THORACIC SPINE FINDINGS Alignment:  Normal Vertebrae: There are hemangiomata within the T5 and T9 vertebral bodies. No low T1 weighted signal lesions. No contrast-enhancing lesions. Cord:  Normal Paraspinal and other soft tissues: Negative. Disc levels: No disc herniation, spinal canal stenosis or neural foraminal narrowing. IMPRESSION: 1. No acute abnormality of the cervical or thoracic spine. No spinal  canal or neural foraminal stenosis. 2. No contrast-enhancing lesions. 3. Mild midcervical degenerative disc disease without stenosis. Electronically Signed   By: Ulyses Jarred M.D.   On: 05/02/2017 02:35    Procedures Procedures (including critical care time)  Medications Ordered in ED Medications  pantoprazole (PROTONIX) EC tablet 40 mg (40 mg Oral Given 05/02/17 0225)  HYDROcodone-acetaminophen (NORCO/VICODIN) 5-325 MG per tablet 2 tablet (not administered)  HYDROmorphone (DILAUDID) injection 1 mg (1 mg Intravenous Given 05/01/17 2018)  HYDROmorphone (DILAUDID) injection 1 mg (1 mg Intravenous Given 05/02/17 0006)  gi cocktail (Maalox,Lidocaine,Donnatal) (30 mLs Oral Given 05/02/17 0013)  gadobenate dimeglumine (MULTIHANCE) injection 15 mL (15 mLs Intravenous Contrast Given 05/02/17 0208)     Initial Impression / Assessment and Plan / ED Course  I have reviewed the triage vital signs and the nursing notes.  Pertinent labs & imaging results that were available during my care of the patient were reviewed by me and considered in my medical decision making (see chart for details).     Pt with persistent back pain, but stable neuro exam.  Pt ambulatory without difficulty.  No clinical evidence of cauda equina and MRI from 5/10 without mets.  MRI of c-spine and t-spine tonight without cord compression, fracture of mets.  Unknown etiology of pt's pain.  No clinical evidence of central process etiology, but pt was offered MRI of his brain but declines MRI of her brain tonight.  Outpatient work-up is appropriate.  Will d/c home with pain control and neuro f/u.  Pt and husband state understanding and are in agreement with the plan.  The patient was discussed with and seen by Dr. Laverta Baltimore who agrees with the treatment plan.   Final Clinical Impressions(s) / ED Diagnoses   Final diagnoses:  Back pain  Paresthesia    New Prescriptions New Prescriptions   HYDROCODONE-ACETAMINOPHEN (NORCO/VICODIN)  5-325 MG TABLET    Take 1-2 tablets by mouth every 6 (six) hours as needed for moderate pain or severe pain.   METHOCARBAMOL (ROBAXIN) 500 MG TABLET    Take 1 tablet (500 mg total) by mouth 2 (two) times daily.     Abigail Butts, Hershal Coria 05/02/17 [id number]    Margette Fast, MD 05/02/17 [id number]

## 2017-05-02 NOTE — Discharge Instructions (Signed)
1. Medications: robaxin, vicodin, usual home medications 2. Treatment: rest, drink plenty of fluids, gentle stretching as discussed, alternate ice and heat 3. Follow Up: Please followup with your primary doctor and neurology in 3 days for discussion of your diagnoses and further evaluation after today's visit; if you do not have a primary care doctor use the resource guide provided to find one;  Return to the ER for worsening back pain, difficulty walking, loss of bowel or bladder control or other concerning symptoms

## 2017-05-04 DIAGNOSIS — M5416 Radiculopathy, lumbar region: Secondary | ICD-10-CM | POA: Diagnosis not present

## 2017-05-04 DIAGNOSIS — M9903 Segmental and somatic dysfunction of lumbar region: Secondary | ICD-10-CM | POA: Diagnosis not present

## 2017-05-05 DIAGNOSIS — M9903 Segmental and somatic dysfunction of lumbar region: Secondary | ICD-10-CM | POA: Diagnosis not present

## 2017-05-05 DIAGNOSIS — M5416 Radiculopathy, lumbar region: Secondary | ICD-10-CM | POA: Diagnosis not present

## 2017-05-06 DIAGNOSIS — M5416 Radiculopathy, lumbar region: Secondary | ICD-10-CM | POA: Diagnosis not present

## 2017-05-06 DIAGNOSIS — M9903 Segmental and somatic dysfunction of lumbar region: Secondary | ICD-10-CM | POA: Diagnosis not present

## 2017-05-08 DIAGNOSIS — M5416 Radiculopathy, lumbar region: Secondary | ICD-10-CM | POA: Diagnosis not present

## 2017-05-08 DIAGNOSIS — M9903 Segmental and somatic dysfunction of lumbar region: Secondary | ICD-10-CM | POA: Diagnosis not present

## 2017-05-12 DIAGNOSIS — M7061 Trochanteric bursitis, right hip: Secondary | ICD-10-CM | POA: Diagnosis not present

## 2017-05-12 DIAGNOSIS — R030 Elevated blood-pressure reading, without diagnosis of hypertension: Secondary | ICD-10-CM | POA: Diagnosis not present

## 2017-05-12 DIAGNOSIS — M7062 Trochanteric bursitis, left hip: Secondary | ICD-10-CM | POA: Diagnosis not present

## 2017-05-12 DIAGNOSIS — M545 Low back pain: Secondary | ICD-10-CM | POA: Diagnosis not present

## 2017-05-12 DIAGNOSIS — M5416 Radiculopathy, lumbar region: Secondary | ICD-10-CM | POA: Diagnosis not present

## 2017-05-12 DIAGNOSIS — M9903 Segmental and somatic dysfunction of lumbar region: Secondary | ICD-10-CM | POA: Diagnosis not present

## 2017-05-13 DIAGNOSIS — M5416 Radiculopathy, lumbar region: Secondary | ICD-10-CM | POA: Diagnosis not present

## 2017-05-13 DIAGNOSIS — M545 Low back pain: Secondary | ICD-10-CM | POA: Diagnosis not present

## 2017-05-13 DIAGNOSIS — M25551 Pain in right hip: Secondary | ICD-10-CM | POA: Diagnosis not present

## 2017-05-13 DIAGNOSIS — M9903 Segmental and somatic dysfunction of lumbar region: Secondary | ICD-10-CM | POA: Diagnosis not present

## 2017-05-14 DIAGNOSIS — M9903 Segmental and somatic dysfunction of lumbar region: Secondary | ICD-10-CM | POA: Diagnosis not present

## 2017-05-14 DIAGNOSIS — M5416 Radiculopathy, lumbar region: Secondary | ICD-10-CM | POA: Diagnosis not present

## 2017-05-16 DIAGNOSIS — M9903 Segmental and somatic dysfunction of lumbar region: Secondary | ICD-10-CM | POA: Diagnosis not present

## 2017-05-16 DIAGNOSIS — M5416 Radiculopathy, lumbar region: Secondary | ICD-10-CM | POA: Diagnosis not present

## 2017-05-18 DIAGNOSIS — M5416 Radiculopathy, lumbar region: Secondary | ICD-10-CM | POA: Diagnosis not present

## 2017-05-18 DIAGNOSIS — M9903 Segmental and somatic dysfunction of lumbar region: Secondary | ICD-10-CM | POA: Diagnosis not present

## 2017-05-19 ENCOUNTER — Other Ambulatory Visit: Payer: Self-pay | Admitting: Oncology

## 2017-05-19 DIAGNOSIS — M9903 Segmental and somatic dysfunction of lumbar region: Secondary | ICD-10-CM | POA: Diagnosis not present

## 2017-05-19 DIAGNOSIS — M5416 Radiculopathy, lumbar region: Secondary | ICD-10-CM | POA: Diagnosis not present

## 2017-05-19 DIAGNOSIS — Z1231 Encounter for screening mammogram for malignant neoplasm of breast: Secondary | ICD-10-CM

## 2017-05-21 DIAGNOSIS — M5416 Radiculopathy, lumbar region: Secondary | ICD-10-CM | POA: Diagnosis not present

## 2017-05-21 DIAGNOSIS — M9903 Segmental and somatic dysfunction of lumbar region: Secondary | ICD-10-CM | POA: Diagnosis not present

## 2017-05-25 DIAGNOSIS — M5416 Radiculopathy, lumbar region: Secondary | ICD-10-CM | POA: Diagnosis not present

## 2017-05-25 DIAGNOSIS — M9903 Segmental and somatic dysfunction of lumbar region: Secondary | ICD-10-CM | POA: Diagnosis not present

## 2017-05-26 ENCOUNTER — Telehealth: Payer: Self-pay

## 2017-05-26 NOTE — Telephone Encounter (Addendum)
Pt called with continuing back pain. "I am in a huge amount of pain". Hardly able to walk, Hardly able to get out of bed. Asked for a call back. lvm attempting to call back. Note she is having an appt today to establish care with D Emily Filbert at [facility] pt back. She said  Dr Sherley Bounds read her MRI to her and said there was nothing there and nothing he can do.  She will be seeing Dr Sabra Heck tomorrow. She will call again tomorrow after she sees him if he cannot help. She is in a lot of pain and does not know what to do, or who to see.  On her MAR is norco she got in ED on 5/19. She said it did help at that time but they only gave her a little.

## 2017-05-27 DIAGNOSIS — M1990 Unspecified osteoarthritis, unspecified site: Secondary | ICD-10-CM | POA: Diagnosis not present

## 2017-05-27 DIAGNOSIS — M25552 Pain in left hip: Secondary | ICD-10-CM | POA: Diagnosis not present

## 2017-05-27 DIAGNOSIS — M545 Low back pain: Secondary | ICD-10-CM | POA: Diagnosis not present

## 2017-05-27 DIAGNOSIS — M5116 Intervertebral disc disorders with radiculopathy, lumbar region: Secondary | ICD-10-CM | POA: Diagnosis not present

## 2017-05-27 DIAGNOSIS — M25551 Pain in right hip: Secondary | ICD-10-CM | POA: Diagnosis not present

## 2017-05-30 DIAGNOSIS — M9903 Segmental and somatic dysfunction of lumbar region: Secondary | ICD-10-CM | POA: Diagnosis not present

## 2017-05-30 DIAGNOSIS — M5416 Radiculopathy, lumbar region: Secondary | ICD-10-CM | POA: Diagnosis not present

## 2017-06-02 DIAGNOSIS — M9903 Segmental and somatic dysfunction of lumbar region: Secondary | ICD-10-CM | POA: Diagnosis not present

## 2017-06-02 DIAGNOSIS — M5416 Radiculopathy, lumbar region: Secondary | ICD-10-CM | POA: Diagnosis not present

## 2017-06-04 DIAGNOSIS — M5010 Cervical disc disorder with radiculopathy, unspecified cervical region: Secondary | ICD-10-CM | POA: Diagnosis not present

## 2017-06-04 DIAGNOSIS — M5414 Radiculopathy, thoracic region: Secondary | ICD-10-CM | POA: Diagnosis not present

## 2017-06-08 ENCOUNTER — Ambulatory Visit: Payer: 59

## 2017-06-11 DIAGNOSIS — M545 Low back pain: Secondary | ICD-10-CM | POA: Diagnosis not present

## 2017-06-11 DIAGNOSIS — M5020 Other cervical disc displacement, unspecified cervical region: Secondary | ICD-10-CM | POA: Diagnosis not present

## 2017-06-11 DIAGNOSIS — M5126 Other intervertebral disc displacement, lumbar region: Secondary | ICD-10-CM | POA: Diagnosis not present

## 2017-06-16 DIAGNOSIS — M545 Low back pain: Secondary | ICD-10-CM | POA: Diagnosis not present

## 2017-06-16 DIAGNOSIS — M5020 Other cervical disc displacement, unspecified cervical region: Secondary | ICD-10-CM | POA: Diagnosis not present

## 2017-06-16 DIAGNOSIS — M5126 Other intervertebral disc displacement, lumbar region: Secondary | ICD-10-CM | POA: Diagnosis not present

## 2017-06-17 ENCOUNTER — Encounter: Payer: Self-pay | Admitting: *Deleted

## 2017-06-17 ENCOUNTER — Emergency Department
Admission: EM | Admit: 2017-06-17 | Discharge: 2017-06-17 | Disposition: A | Discharge status: Other Acute Inpatient Hospital | Payer: 59 | Attending: Emergency Medicine | Admitting: Emergency Medicine

## 2017-06-17 ENCOUNTER — Emergency Department: Payer: 59

## 2017-06-17 DIAGNOSIS — R930 Abnormal findings on diagnostic imaging of skull and head, not elsewhere classified: Secondary | ICD-10-CM | POA: Diagnosis not present

## 2017-06-17 DIAGNOSIS — Z9989 Dependence on other enabling machines and devices: Secondary | ICD-10-CM | POA: Diagnosis not present

## 2017-06-17 DIAGNOSIS — R410 Disorientation, unspecified: Secondary | ICD-10-CM | POA: Diagnosis not present

## 2017-06-17 DIAGNOSIS — M5440 Lumbago with sciatica, unspecified side: Secondary | ICD-10-CM | POA: Diagnosis not present

## 2017-06-17 DIAGNOSIS — M545 Low back pain: Secondary | ICD-10-CM

## 2017-06-17 DIAGNOSIS — G8929 Other chronic pain: Secondary | ICD-10-CM

## 2017-06-17 DIAGNOSIS — Z79899 Other long term (current) drug therapy: Secondary | ICD-10-CM | POA: Insufficient documentation

## 2017-06-17 DIAGNOSIS — G919 Hydrocephalus, unspecified: Secondary | ICD-10-CM | POA: Diagnosis not present

## 2017-06-17 DIAGNOSIS — R51 Headache: Secondary | ICD-10-CM | POA: Diagnosis not present

## 2017-06-17 DIAGNOSIS — M542 Cervicalgia: Secondary | ICD-10-CM | POA: Diagnosis not present

## 2017-06-17 LAB — COMPREHENSIVE METABOLIC PANEL
ALT: 26 U/L (ref 14–54)
AST: 22 U/L (ref 15–41)
Albumin: 4.8 g/dL (ref 3.5–5.0)
Alkaline Phosphatase: 39 U/L (ref 38–126)
Anion gap: 11 (ref 5–15)
BUN: 16 mg/dL (ref 6–20)
CHLORIDE: 107 mmol/L (ref 101–111)
CO2: 23 mmol/L (ref 22–32)
Calcium: 9.8 mg/dL (ref 8.9–10.3)
Creatinine, Ser: 0.61 mg/dL (ref 0.44–1.00)
GFR calc non Af Amer: >60 mL/min (ref >60)
Glucose, Bld: 151 mg/dL — ABNORMAL HIGH (ref 65–99)
Potassium: 3.4 mmol/L — ABNORMAL LOW (ref 3.5–5.1)
SODIUM: 141 mmol/L (ref 135–145)
Total Bilirubin: 0.8 mg/dL (ref 0.3–1.2)
Total Protein: 7.7 g/dL (ref 6.5–8.1)

## 2017-06-17 LAB — URINALYSIS, COMPLETE (UACMP) WITH MICROSCOPIC
BILIRUBIN URINE: NEGATIVE
Bacteria, UA: NONE SEEN
GLUCOSE, UA: NEGATIVE mg/dL
Ketones, ur: 20 mg/dL — AB
LEUKOCYTES UA: NEGATIVE
Nitrite: NEGATIVE
PH: 7 (ref 5.0–8.0)
Protein, ur: NEGATIVE mg/dL
Specific Gravity, Urine: 1.013 (ref 1.005–1.030)
Squamous Epithelial / LPF: NONE SEEN

## 2017-06-17 LAB — URINE DRUG SCREEN, QUALITATIVE (ARMC ONLY)
Amphetamines, Ur Screen: NOT DETECTED
BARBITURATES, UR SCREEN: NOT DETECTED
Benzodiazepine, Ur Scrn: NOT DETECTED
Cannabinoid 50 Ng, Ur ~~LOC~~: NOT DETECTED
Cocaine Metabolite,Ur ~~LOC~~: NOT DETECTED
MDMA (Ecstasy)Ur Screen: NOT DETECTED
Methadone Scn, Ur: NOT DETECTED
OPIATE, UR SCREEN: POSITIVE — AB
PHENCYCLIDINE (PCP) UR S: NOT DETECTED
Tricyclic, Ur Screen: NOT DETECTED

## 2017-06-17 LAB — CBC
HEMATOCRIT: 47.5 % — AB (ref 35.0–47.0)
HEMOGLOBIN: 16.4 g/dL — AB (ref 12.0–16.0)
MCH: 30.5 pg (ref 26.0–34.0)
MCHC: 34.4 g/dL (ref 32.0–36.0)
MCV: 88.6 fL (ref 80.0–100.0)
Platelets: 264 10*3/uL (ref 150–440)
RBC: 5.37 MIL/uL — AB (ref 3.80–5.20)
RDW: 14.3 % (ref 11.5–14.5)
WBC: 10.5 10*3/uL (ref 3.6–11.0)

## 2017-06-17 LAB — TSH: TSH: 0.714 u[IU]/mL (ref 0.350–4.500)

## 2017-06-17 LAB — GLUCOSE, CAPILLARY: Glucose-Capillary: 139 mg/dL — ABNORMAL HIGH (ref 65–99)

## 2017-06-17 MED ORDER — LORAZEPAM 2 MG/ML IJ SOLN
0.5000 mg | Freq: Once | INTRAMUSCULAR | Status: AC
  Administered 2017-06-17: 0.5 mg via INTRAVENOUS
  Filled 2017-06-17: qty 1

## 2017-06-17 MED ORDER — MORPHINE SULFATE (PF) 4 MG/ML IV SOLN
4.0000 mg | Freq: Once | INTRAVENOUS | Status: AC
  Administered 2017-06-17: 4 mg via INTRAVENOUS
  Filled 2017-06-17: qty 1

## 2017-06-17 MED ORDER — ONDANSETRON HCL 4 MG/2ML IJ SOLN
4.0000 mg | Freq: Once | INTRAMUSCULAR | Status: AC
  Administered 2017-06-17: 4 mg via INTRAVENOUS
  Filled 2017-06-17: qty 2

## 2017-06-17 NOTE — ED Notes (Signed)
Patient transported to CT 

## 2017-06-17 NOTE — ED Provider Notes (Signed)
[facility], [place] Emergency Department Provider Note   ____________________________________________   First MD Initiated Contact with Patient 06/17/17 1639     (approximate)  I have reviewed the triage vital signs and the nursing notes.   HISTORY  Chief Complaint Headache  EM caveat: Limited by confusion  HPI Christina Blackwell is a 51 y.o. female brought here by husband and friend who are with her today reporting worsening confusion.  Yesterday patient's friend noticed that she seemed slightly confused, wasn't sure where she was going while going to see her orthopedist regarding chronic back pain. She seems slightly lost and confused with regard to direction in her own town, and then today she seemed to be getting even more disoriented, complained of nausea and a headache. She continues to complain of severe lower back pain which is been ongoing for some time without change except slight worsening over the last few months  No fevers or chills. The patient at this time reports she is not having a severe headache, but does report a mild feeling of headache across the top of her head. She is disoriented, does not know the month, believes the year is 2019    Past Medical History:  Diagnosis Date  . Anxiety   . Breast cancer ([place]) 2002   left   . Breast cancer ([place]) 2012   right  . Complication of anesthesia   . Depression   . GERD (gastroesophageal reflux disease)   . History of bilateral breast implants   . History of chemotherapy    cyclosphosphamide/ doxorubicin  . History of chemotherapy 4th:June 13,2013   taxol  . History of radiation therapy 03/30/02-05/16/02   left breast   . History of radiation therapy 06/22/12 -08/06/12   right breast  . PONV (postoperative nausea and vomiting)   . Seroma 12/2012 u/s xray   right breast/aspiration 01/19/13    Patient Active Problem List   Diagnosis Date Noted  . Osteoporosis 02/05/2017  . Iron deficiency anemia  05/05/2015  . Herpes 03/28/2015  . 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 Hot flashes due to tamoxifen 07/12/2013  . 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 Seroma 01/19/2013  . Anxiety 01/10/2013  . History of radiation therapy   . Malignant neoplasm of upper-outer quadrant of left breast in female, estrogen receptor negative ([place]) 02/12/2012    Past Surgical History:  Procedure Laterality Date  . AXILLARY LYMPH NODE DISSECTION  03/02/2012   Procedure: AXILLARY LYMPH NODE DISSECTION;  Surgeon: Shann Medal, MD;  Location: [facility];  Service: General;  Laterality: Right;  . BREAST ENHANCEMENT SURGERY  2003   bilateral  . BREAST LUMPECTOMY  11/2001   left  . BREAST LUMPECTOMY W/ NEEDLE LOCALIZATION  02/05/2012   right/ slnbx  . mirena IUD     still in place as of 06/09/12  . PORTACATH PLACEMENT    . Portacath removal    . SENTINEL LYMPH NODE BIOPSY  11/2001   left axillary  . TONSILLECTOMY AND ADENOIDECTOMY      Prior to Admission medications   Medication Sig Start Date End Date Taking? Authorizing Provider  acetaminophen (TYLENOL) 500 MG tablet Take 500 mg by mouth every 6 (six) hours as needed for mild pain or moderate pain.    [provider]  anastrozole (ARIMIDEX) 1 MG tablet TAKE 1 TABLET (1 MG TOTAL) BY MOUTH DAILY. 03/09/17   Magrinat, Virgie Dad, MD  buPROPion (WELLBUTRIN XL) 300 MG 24 hr  tablet Take 300 mg by mouth daily. 02/02/17 02/02/18  [provider]  cholecalciferol (VITAMIN D) 1000 UNITS tablet Take 1 tablet (1,000 Units total) by mouth daily. Patient not taking: Reported on 05/01/2017 05/05/15   Magrinat, Virgie Dad, MD  HYDROcodone-acetaminophen (NORCO/VICODIN) 5-325 MG tablet Take 1-2 tablets by mouth every 6 (six) hours as needed for moderate pain or severe pain. 05/02/17   Muthersbaugh, Jarrett Soho, PA-C  ibuprofen (ADVIL,MOTRIN) 200 MG tablet Take 200 mg by mouth every 6 (six) hours as needed.    [provider]  LORazepam (ATIVAN) 0.5 MG  tablet Take 1 tablet (0.5 mg total) by mouth at bedtime as needed for anxiety or sleep. 10/23/15   Magrinat, Virgie Dad, MD  Lorcaserin HCl ER (BELVIQ XR) 20 MG TB24 Take 20 mg by mouth daily. 02/02/17   [provider]  methocarbamol (ROBAXIN) 500 MG tablet Take 1 tablet (500 mg total) by mouth 2 (two) times daily. 05/02/17   Muthersbaugh, Jarrett Soho, PA-C  Multiple Vitamin (MULTIVITAMIN) tablet Take 1 tablet by mouth daily.    [provider]  omeprazole (PRILOSEC) 20 MG capsule Take 20 mg by mouth daily.    [provider]  traMADol (ULTRAM) 50 MG tablet Take 1 tablet (50 mg total) by mouth every 6 (six) hours as needed. 04/29/17   Magrinat, Virgie Dad, MD    Allergies Rocuronium  Family History  Problem Relation Age of Onset  . Cancer Mother        breast  . Cancer Paternal Aunt        breast  . Cancer Cousin        breast ca 45's 1st cousin    Social History Social History  Substance Use Topics  . Smoking status: Never Smoker  . Smokeless tobacco: Never Used  . Alcohol use Yes     Comment: social occasio0nally 1-2     Review of Systems - some limitations due to patient disorientation suspected Constitutional: No fever/chills Eyes: No visual changes. ENT: No sore throat. Denies neck pain Cardiovascular: Denies chest pain. Respiratory: Denies shortness of breath. Gastrointestinal: No abdominal pain.  No nausea, no vomiting.  No diarrhea.  No constipation. Genitourinary: Negative for dysuria. Musculoskeletal: Severe unremitting lower back pain that is been present for months without significant change Skin: Negative for rash. Neurological: Negative for focal weakness or numbness. No seizures    ____________________________________________   PHYSICAL EXAM:  VITAL SIGNS: ED Triage Vitals  Enc Vitals Group     BP 06/17/17 1630 (!) 156/100     Pulse Rate 06/17/17 1630 97     Resp 06/17/17 1630 18     Temp 06/17/17 1630 98 F (36.7 C)     Temp  Source 06/17/17 1630 Oral     SpO2 06/17/17 1630 98 %     Weight --      Height --      Head Circumference --      Peak Flow --      Pain Score 06/17/17 1631 10     Pain Loc --      Pain Edu? --      Excl. in [place]? --     Constitutional: Alert and orientedTo self and friends but not to month or year. Well appearing and in no acute distress. Eyes: Conjunctivae are normal. No photophobia Head: Atraumatic. Nose: No congestion/rhinnorhea. Mouth/Throat: Mucous membranes are moist. Neck: No stridor.  No meningismus. Cardiovascular: Normal rate, regular rhythm. Grossly normal heart sounds.  Good peripheral circulation. Respiratory: Normal respiratory effort.  No retractions. Lungs CTAB. Gastrointestinal: Soft and nontender. No distention. Musculoskeletal: Moves all extremities with no gross deficits. Reports normal sensation in upper extremities bilaterally. Neurologic:  Normal speech and language. No gross focal neurologic deficits are appreciated.  Skin:  Skin is warm, dry and intact. No rash noted. Psychiatric: Mood and affect are slightly anxious. Speech and behavior are normal except she wishes to pace back and forth noting her back pain continues to other.  ____________________________________________   LABS (all labs ordered are listed, but only abnormal results are displayed)  Labs Reviewed  COMPREHENSIVE METABOLIC PANEL - Abnormal; Notable for the following:       Result Value   Potassium 3.4 (*)    Glucose, Bld 151 (*)    All other components within normal limits  CBC - Abnormal; Notable for the following:    RBC 5.37 (*)    Hemoglobin 16.4 (*)    HCT 47.5 (*)    All other components within normal limits  URINALYSIS, COMPLETE (UACMP) WITH MICROSCOPIC - Abnormal; Notable for the following:    Color, Urine YELLOW (*)    APPearance HAZY (*)    Hgb urine dipstick SMALL (*)    Ketones, ur 20 (*)    All other components within normal limits  URINE DRUG SCREEN, QUALITATIVE  ([facility] ONLY) - Abnormal; Notable for the following:    Opiate, Ur Screen POSITIVE (*)    All other components within normal limits  GLUCOSE, CAPILLARY - Abnormal; Notable for the following:    Glucose-Capillary 139 (*)    All other components within normal limits  TSH  CBG MONITORING, ED  CBG MONITORING, ED   ____________________________________________  EKG  ED ECG REPORT I, QUALE, MARK, the attending physician, personally viewed and interpreted this ECG.  Date: 06/17/2017 EKG Time: 1710 Rate: 95 Rhythm: normal sinus rhythm QRS Axis: normal Intervals: normal ST/T Wave abnormalities: normal Narrative Interpretation: unremarkable  ____________________________________________  RADIOLOGY  Ct Head Wo Contrast  Result Date: 06/17/2017 CLINICAL DATA:  Sudden onset headache. EXAM: CT HEAD WITHOUT CONTRAST TECHNIQUE: Contiguous axial images were obtained from the base of the skull through the vertex without intravenous contrast. COMPARISON:  None. FINDINGS: Brain: No evidence for acute hemorrhage. No mass-effect or abnormal extra-axial fluid collection. No evidence for acute ischemia. Patchy low attenuation in the deep hemispheric and periventricular white matter is nonspecific, but likely reflects chronic microvascular ischemic demyelination. Lateral ventricles are prominent bilaterally as are the third and fourth ventricles. Vascular: No hyperdense vessel or unexpected calcification. Skull: No evidence for fracture. No worrisome lytic or sclerotic lesion. Sinuses/Orbits: The visualized paranasal sinuses and mastoid air cells are clear. Visualized portions of the globes and intraorbital fat are unremarkable. Other: None. IMPRESSION: 1. Prominence of the ventricles may be related to central atrophy, but the ventricular distension appears out of proportion to the degree of parenchymal atrophy. As such, communicating hydrocephalus cannot be excluded on this exam. Periventricular hypoattenuation  raises the question of transependymal CSF flow. Consider MRI of the brain without and with contrast to further evaluate. Electronically Signed   By: Misty Stanley M.D.   On: 06/17/2017 17:00    ____________________________________________   PROCEDURES  Procedure(s) performed: None  Procedures  Critical Care performed: No  ____________________________________________   INITIAL IMPRESSION / ASSESSMENT AND PLAN / ED COURSE  Pertinent labs & imaging results that were available during my care of the patient were reviewed by me and considered in my  medical decision making (see chart for details).  Patient presents for increasing confusion now associated vomiting. She reports severe lower back pain which is chronic, does not appear to be an acute change regarding this. She has no focal findings on exam other than confusion and some disorientation.   No meningismus. No neck pain. No fever. No signs or symptoms of obvious infectious etiology noted.  Spoke with Dr. Cari Caraway (Neurosurgery) regarding presentation and head CT imaging. He reports if no obvious cause such as UTI, metabolic, etc then he would recommend transfer to [facility] for further work-up.   ----------------------------------------- 7:10 PM on 06/17/2017 -----------------------------------------  Spoke with Dr. Lacinda Axon, patient accepted in transfer by Dr. Cathleen Fears to [facility] emergency room where neurosurgery team will evaluate her for concerns of possible headache and abnormal head CT for which I am not yet certain as to etiology though Dr. Lacinda Axon advised patient will need further workup, given history of cancer also would recommend she have an MRI and that they would be able to provide measurement of CSF pressure/LP at [facility] if needed as well.  I spoke to the patient about obtaining a spinal tap to exclude meningitis at this time prior to transfer. Discussed risks and benefits, and after discussion patient reports she has  chronic severe lower back pain and would strongly prefer to have this evaluated at [facility] by neurosurgery prior to any decision to consider a spinal tap. I think this is reasonable. At the present time she has no obvious signs or symptoms of meningitis such as nuchal rigidity, meningismus, fever, elevated white count. She reports she is not having any neck pain at this time, and presently her headache is improved but back pain continues to bother her.  Discussed with patient, she is agreeable with plan for transfer.      ____________________________________________   FINAL CLINICAL IMPRESSION(S) / ED DIAGNOSES  Final diagnoses:  Confusion  Abnormal CT of the head  Chronic low back pain, unspecified back pain laterality, with sciatica presence unspecified      NEW MEDICATIONS STARTED DURING THIS VISIT:  New Prescriptions   No medications on file     Note:  This document was prepared using Dragon voice recognition software and may include unintentional dictation errors.     Delman Kitten, MD 06/17/17 [phone]

## 2017-06-17 NOTE — ED Notes (Signed)
Patient actively vomiting at this time.  MD notified.  Patient changed into new gown with new linens.

## 2017-06-17 NOTE — ED Notes (Signed)
Pt was transferred via EMS to [facility] before signing for discharge

## 2017-06-17 NOTE — ED Triage Notes (Signed)
Pt to ED reporting sudden onset of head pain and neck pain. No light sensitivity. No sensitivity to noise. No slurred speech or neuro deficits noted. Pt is anxious in triage room and unable to sit still. PT is disoriented to time and situation. Friend reports pts headache began yesterday but pt does not remember events from yesterday. Pt verbalized it is 2004 and pt also called friends in the room by the wrong name.   Pt deneis other symptoms. NO fevers, cough, SOB, Chest pain, Abd pain ,changes in vision , dizziness or lightheadedness

## 2017-06-17 NOTE — ED Provider Notes (Signed)
-----------------------------------------   8:10 PM on 06/17/2017 -----------------------------------------  Resting comfortably. Patient reports she is comfortable and resting now. Vital signs stable, awake and alert and appears in no distress.  EMS reports transport team should be here within a few minutes. ALS to [facility] ER   Delman Kitten, MD 06/17/17 2011

## 2017-06-18 ENCOUNTER — Ambulatory Visit: Payer: 59 | Admitting: Oncology

## 2017-06-18 DIAGNOSIS — Z923 Personal history of irradiation: Secondary | ICD-10-CM | POA: Diagnosis not present

## 2017-06-18 DIAGNOSIS — G919 Hydrocephalus, unspecified: Secondary | ICD-10-CM | POA: Diagnosis not present

## 2017-06-18 DIAGNOSIS — M6281 Muscle weakness (generalized): Secondary | ICD-10-CM | POA: Diagnosis not present

## 2017-06-18 DIAGNOSIS — C50911 Malignant neoplasm of unspecified site of right female breast: Secondary | ICD-10-CM | POA: Diagnosis not present

## 2017-06-18 DIAGNOSIS — Z853 Personal history of malignant neoplasm of breast: Secondary | ICD-10-CM | POA: Diagnosis not present

## 2017-06-18 DIAGNOSIS — C7949 Secondary malignant neoplasm of other parts of nervous system: Secondary | ICD-10-CM | POA: Diagnosis not present

## 2017-06-18 DIAGNOSIS — G910 Communicating hydrocephalus: Secondary | ICD-10-CM | POA: Diagnosis not present

## 2017-06-18 DIAGNOSIS — Z01818 Encounter for other preprocedural examination: Secondary | ICD-10-CM | POA: Diagnosis not present

## 2017-06-18 DIAGNOSIS — M549 Dorsalgia, unspecified: Secondary | ICD-10-CM | POA: Diagnosis not present

## 2017-06-18 DIAGNOSIS — E871 Hypo-osmolality and hyponatremia: Secondary | ICD-10-CM | POA: Diagnosis not present

## 2017-06-18 DIAGNOSIS — Z982 Presence of cerebrospinal fluid drainage device: Secondary | ICD-10-CM | POA: Diagnosis not present

## 2017-06-18 DIAGNOSIS — R222 Localized swelling, mass and lump, trunk: Secondary | ICD-10-CM | POA: Diagnosis not present

## 2017-06-18 DIAGNOSIS — G039 Meningitis, unspecified: Secondary | ICD-10-CM | POA: Diagnosis not present

## 2017-06-18 DIAGNOSIS — R262 Difficulty in walking, not elsewhere classified: Secondary | ICD-10-CM | POA: Diagnosis not present

## 2017-06-18 DIAGNOSIS — G911 Obstructive hydrocephalus: Secondary | ICD-10-CM | POA: Diagnosis not present

## 2017-06-18 DIAGNOSIS — Z9011 Acquired absence of right breast and nipple: Secondary | ICD-10-CM | POA: Diagnosis not present

## 2017-06-23 ENCOUNTER — Ambulatory Visit: Payer: 59

## 2017-07-01 DIAGNOSIS — R262 Difficulty in walking, not elsewhere classified: Secondary | ICD-10-CM | POA: Diagnosis not present

## 2017-07-01 DIAGNOSIS — G911 Obstructive hydrocephalus: Secondary | ICD-10-CM | POA: Diagnosis not present

## 2017-07-01 DIAGNOSIS — C701 Malignant neoplasm of spinal meninges: Secondary | ICD-10-CM | POA: Diagnosis not present

## 2017-07-01 DIAGNOSIS — C7949 Secondary malignant neoplasm of other parts of nervous system: Secondary | ICD-10-CM | POA: Diagnosis not present

## 2017-07-01 DIAGNOSIS — C50912 Malignant neoplasm of unspecified site of left female breast: Secondary | ICD-10-CM | POA: Diagnosis not present

## 2017-07-01 DIAGNOSIS — M6281 Muscle weakness (generalized): Secondary | ICD-10-CM | POA: Diagnosis not present

## 2017-07-01 DIAGNOSIS — Z923 Personal history of irradiation: Secondary | ICD-10-CM | POA: Diagnosis not present

## 2017-07-01 DIAGNOSIS — Z170 Estrogen receptor positive status [ER+]: Secondary | ICD-10-CM | POA: Diagnosis not present

## 2017-07-01 DIAGNOSIS — C50911 Malignant neoplasm of unspecified site of right female breast: Secondary | ICD-10-CM | POA: Diagnosis not present

## 2017-07-04 DIAGNOSIS — M6281 Muscle weakness (generalized): Secondary | ICD-10-CM | POA: Diagnosis not present

## 2017-07-04 DIAGNOSIS — C50911 Malignant neoplasm of unspecified site of right female breast: Secondary | ICD-10-CM | POA: Diagnosis not present

## 2017-07-04 DIAGNOSIS — C50912 Malignant neoplasm of unspecified site of left female breast: Secondary | ICD-10-CM | POA: Diagnosis not present

## 2017-07-08 DIAGNOSIS — Z170 Estrogen receptor positive status [ER+]: Secondary | ICD-10-CM | POA: Diagnosis not present

## 2017-07-08 DIAGNOSIS — C7949 Secondary malignant neoplasm of other parts of nervous system: Secondary | ICD-10-CM | POA: Diagnosis not present

## 2017-07-08 DIAGNOSIS — C50911 Malignant neoplasm of unspecified site of right female breast: Secondary | ICD-10-CM | POA: Diagnosis not present

## 2017-07-09 DIAGNOSIS — C50911 Malignant neoplasm of unspecified site of right female breast: Secondary | ICD-10-CM | POA: Diagnosis not present

## 2017-07-09 DIAGNOSIS — C701 Malignant neoplasm of spinal meninges: Secondary | ICD-10-CM | POA: Diagnosis not present

## 2017-07-09 DIAGNOSIS — C50912 Malignant neoplasm of unspecified site of left female breast: Secondary | ICD-10-CM | POA: Diagnosis not present

## 2017-07-16 DIAGNOSIS — M6281 Muscle weakness (generalized): Secondary | ICD-10-CM | POA: Diagnosis not present

## 2017-07-16 DIAGNOSIS — C50912 Malignant neoplasm of unspecified site of left female breast: Secondary | ICD-10-CM | POA: Diagnosis not present

## 2017-07-16 DIAGNOSIS — C50911 Malignant neoplasm of unspecified site of right female breast: Secondary | ICD-10-CM | POA: Diagnosis not present

## 2017-07-22 DIAGNOSIS — C50911 Malignant neoplasm of unspecified site of right female breast: Secondary | ICD-10-CM | POA: Diagnosis not present

## 2017-07-22 DIAGNOSIS — C7949 Secondary malignant neoplasm of other parts of nervous system: Secondary | ICD-10-CM | POA: Diagnosis not present

## 2017-07-22 DIAGNOSIS — Z170 Estrogen receptor positive status [ER+]: Secondary | ICD-10-CM | POA: Diagnosis not present

## 2017-07-23 DIAGNOSIS — C799 Secondary malignant neoplasm of unspecified site: Secondary | ICD-10-CM | POA: Diagnosis not present

## 2017-07-23 DIAGNOSIS — C50911 Malignant neoplasm of unspecified site of right female breast: Secondary | ICD-10-CM | POA: Diagnosis not present

## 2017-07-24 DIAGNOSIS — C50911 Malignant neoplasm of unspecified site of right female breast: Secondary | ICD-10-CM | POA: Diagnosis not present

## 2017-07-24 DIAGNOSIS — C799 Secondary malignant neoplasm of unspecified site: Secondary | ICD-10-CM | POA: Diagnosis not present

## 2017-07-28 DIAGNOSIS — C799 Secondary malignant neoplasm of unspecified site: Secondary | ICD-10-CM | POA: Diagnosis not present

## 2017-07-28 DIAGNOSIS — C50911 Malignant neoplasm of unspecified site of right female breast: Secondary | ICD-10-CM | POA: Diagnosis not present

## 2017-07-29 DIAGNOSIS — C50911 Malignant neoplasm of unspecified site of right female breast: Secondary | ICD-10-CM | POA: Diagnosis not present

## 2017-07-29 DIAGNOSIS — C799 Secondary malignant neoplasm of unspecified site: Secondary | ICD-10-CM | POA: Diagnosis not present

## 2017-07-30 DIAGNOSIS — C799 Secondary malignant neoplasm of unspecified site: Secondary | ICD-10-CM | POA: Diagnosis not present

## 2017-07-30 DIAGNOSIS — C50911 Malignant neoplasm of unspecified site of right female breast: Secondary | ICD-10-CM | POA: Diagnosis not present

## 2017-08-03 ENCOUNTER — Other Ambulatory Visit: Payer: 59

## 2017-08-03 ENCOUNTER — Ambulatory Visit: Payer: 59 | Admitting: Oncology

## 2017-08-03 ENCOUNTER — Ambulatory Visit: Payer: 59

## 2017-08-04 DIAGNOSIS — C799 Secondary malignant neoplasm of unspecified site: Secondary | ICD-10-CM | POA: Diagnosis not present

## 2017-08-04 DIAGNOSIS — C50911 Malignant neoplasm of unspecified site of right female breast: Secondary | ICD-10-CM | POA: Diagnosis not present

## 2017-08-07 DIAGNOSIS — C50911 Malignant neoplasm of unspecified site of right female breast: Secondary | ICD-10-CM | POA: Diagnosis not present

## 2017-08-07 DIAGNOSIS — C799 Secondary malignant neoplasm of unspecified site: Secondary | ICD-10-CM | POA: Diagnosis not present

## 2017-08-10 DIAGNOSIS — C50911 Malignant neoplasm of unspecified site of right female breast: Secondary | ICD-10-CM | POA: Diagnosis not present

## 2017-08-10 DIAGNOSIS — C799 Secondary malignant neoplasm of unspecified site: Secondary | ICD-10-CM | POA: Diagnosis not present

## 2017-08-12 DIAGNOSIS — C50911 Malignant neoplasm of unspecified site of right female breast: Secondary | ICD-10-CM | POA: Diagnosis not present

## 2017-08-12 DIAGNOSIS — C799 Secondary malignant neoplasm of unspecified site: Secondary | ICD-10-CM | POA: Diagnosis not present

## 2017-08-13 DIAGNOSIS — C799 Secondary malignant neoplasm of unspecified site: Secondary | ICD-10-CM | POA: Diagnosis not present

## 2017-08-13 DIAGNOSIS — C50911 Malignant neoplasm of unspecified site of right female breast: Secondary | ICD-10-CM | POA: Diagnosis not present

## 2017-08-19 DIAGNOSIS — C50911 Malignant neoplasm of unspecified site of right female breast: Secondary | ICD-10-CM | POA: Diagnosis not present

## 2017-08-19 DIAGNOSIS — C7949 Secondary malignant neoplasm of other parts of nervous system: Secondary | ICD-10-CM | POA: Diagnosis not present

## 2017-08-19 DIAGNOSIS — Z170 Estrogen receptor positive status [ER+]: Secondary | ICD-10-CM | POA: Diagnosis not present

## 2017-08-20 DIAGNOSIS — C799 Secondary malignant neoplasm of unspecified site: Secondary | ICD-10-CM | POA: Diagnosis not present

## 2017-08-20 DIAGNOSIS — C50911 Malignant neoplasm of unspecified site of right female breast: Secondary | ICD-10-CM | POA: Diagnosis not present

## 2017-08-25 DIAGNOSIS — C799 Secondary malignant neoplasm of unspecified site: Secondary | ICD-10-CM | POA: Diagnosis not present

## 2017-08-25 DIAGNOSIS — C50911 Malignant neoplasm of unspecified site of right female breast: Secondary | ICD-10-CM | POA: Diagnosis not present

## 2017-09-02 DIAGNOSIS — C799 Secondary malignant neoplasm of unspecified site: Secondary | ICD-10-CM | POA: Diagnosis not present

## 2017-09-02 DIAGNOSIS — C50911 Malignant neoplasm of unspecified site of right female breast: Secondary | ICD-10-CM | POA: Diagnosis not present

## 2017-09-16 DIAGNOSIS — C50911 Malignant neoplasm of unspecified site of right female breast: Secondary | ICD-10-CM | POA: Diagnosis not present

## 2017-09-16 DIAGNOSIS — Z170 Estrogen receptor positive status [ER+]: Secondary | ICD-10-CM | POA: Diagnosis not present

## 2017-09-16 DIAGNOSIS — C7949 Secondary malignant neoplasm of other parts of nervous system: Secondary | ICD-10-CM | POA: Diagnosis not present

## 2017-10-02 NOTE — Telephone Encounter (Signed)
No entry 

## 2017-10-14 DIAGNOSIS — Z170 Estrogen receptor positive status [ER+]: Secondary | ICD-10-CM | POA: Diagnosis not present

## 2017-10-14 DIAGNOSIS — C50911 Malignant neoplasm of unspecified site of right female breast: Secondary | ICD-10-CM | POA: Diagnosis not present

## 2017-10-14 DIAGNOSIS — C7949 Secondary malignant neoplasm of other parts of nervous system: Secondary | ICD-10-CM | POA: Diagnosis not present

## 2017-11-11 DIAGNOSIS — K521 Toxic gastroenteritis and colitis: Secondary | ICD-10-CM | POA: Diagnosis not present

## 2017-11-11 DIAGNOSIS — C7949 Secondary malignant neoplasm of other parts of nervous system: Secondary | ICD-10-CM | POA: Diagnosis not present

## 2017-11-11 DIAGNOSIS — Z170 Estrogen receptor positive status [ER+]: Secondary | ICD-10-CM | POA: Diagnosis not present

## 2017-11-11 DIAGNOSIS — C50911 Malignant neoplasm of unspecified site of right female breast: Secondary | ICD-10-CM | POA: Diagnosis not present

## 2017-12-09 DIAGNOSIS — Z170 Estrogen receptor positive status [ER+]: Secondary | ICD-10-CM | POA: Diagnosis not present

## 2017-12-09 DIAGNOSIS — C50911 Malignant neoplasm of unspecified site of right female breast: Secondary | ICD-10-CM | POA: Diagnosis not present

## 2017-12-09 DIAGNOSIS — C7949 Secondary malignant neoplasm of other parts of nervous system: Secondary | ICD-10-CM | POA: Diagnosis not present

## 2018-01-06 DIAGNOSIS — K521 Toxic gastroenteritis and colitis: Secondary | ICD-10-CM | POA: Diagnosis not present

## 2018-01-06 DIAGNOSIS — Z170 Estrogen receptor positive status [ER+]: Secondary | ICD-10-CM | POA: Diagnosis not present

## 2018-01-06 DIAGNOSIS — C50911 Malignant neoplasm of unspecified site of right female breast: Secondary | ICD-10-CM | POA: Diagnosis not present

## 2018-01-06 DIAGNOSIS — D1809 Hemangioma of other sites: Secondary | ICD-10-CM | POA: Diagnosis not present

## 2018-01-06 DIAGNOSIS — C7949 Secondary malignant neoplasm of other parts of nervous system: Secondary | ICD-10-CM | POA: Diagnosis not present

## 2018-02-03 DIAGNOSIS — C7949 Secondary malignant neoplasm of other parts of nervous system: Secondary | ICD-10-CM | POA: Diagnosis not present

## 2018-03-03 DIAGNOSIS — C50411 Malignant neoplasm of upper-outer quadrant of right female breast: Secondary | ICD-10-CM | POA: Diagnosis not present

## 2018-03-03 DIAGNOSIS — C50412 Malignant neoplasm of upper-outer quadrant of left female breast: Secondary | ICD-10-CM | POA: Diagnosis not present

## 2018-03-03 DIAGNOSIS — Z170 Estrogen receptor positive status [ER+]: Secondary | ICD-10-CM | POA: Diagnosis not present

## 2018-03-03 DIAGNOSIS — C7949 Secondary malignant neoplasm of other parts of nervous system: Secondary | ICD-10-CM | POA: Diagnosis not present

## 2018-03-30 DIAGNOSIS — C7949 Secondary malignant neoplasm of other parts of nervous system: Secondary | ICD-10-CM | POA: Diagnosis not present

## 2018-03-30 DIAGNOSIS — C7951 Secondary malignant neoplasm of bone: Secondary | ICD-10-CM | POA: Diagnosis not present

## 2018-04-29 DIAGNOSIS — C7949 Secondary malignant neoplasm of other parts of nervous system: Secondary | ICD-10-CM | POA: Diagnosis not present

## 2018-05-26 DIAGNOSIS — C7949 Secondary malignant neoplasm of other parts of nervous system: Secondary | ICD-10-CM | POA: Diagnosis not present

## 2018-06-23 DIAGNOSIS — C7949 Secondary malignant neoplasm of other parts of nervous system: Secondary | ICD-10-CM | POA: Diagnosis not present

## 2018-07-30 DIAGNOSIS — C50911 Malignant neoplasm of unspecified site of right female breast: Secondary | ICD-10-CM | POA: Diagnosis not present

## 2018-07-30 DIAGNOSIS — C801 Malignant (primary) neoplasm, unspecified: Secondary | ICD-10-CM | POA: Diagnosis not present

## 2018-07-30 DIAGNOSIS — C7949 Secondary malignant neoplasm of other parts of nervous system: Secondary | ICD-10-CM | POA: Diagnosis not present

## 2018-07-30 DIAGNOSIS — C50411 Malignant neoplasm of upper-outer quadrant of right female breast: Secondary | ICD-10-CM | POA: Diagnosis not present

## 2018-07-30 DIAGNOSIS — Z170 Estrogen receptor positive status [ER+]: Secondary | ICD-10-CM | POA: Diagnosis not present

## 2018-08-25 DIAGNOSIS — C7949 Secondary malignant neoplasm of other parts of nervous system: Secondary | ICD-10-CM | POA: Diagnosis not present

## 2018-09-17 IMAGING — MR MR THORACIC SPINE WO/W CM
6 of 13 series · 19 of 48 positions shown · IV contrast (multihance)
Comparison: None.

CLINICAL DATA: Worsening back pain radiating to the left buttocks
and the right hand. History of breast cancer.

EXAM:
MRI CERVICAL SPINE WITH AND WITHOUT CONTRAST
MRI THORACIC SPINE WITH AND WITHOUT CONTRAST
TECHNIQUE: Multiplanar and multiecho pulse sequences of the cervical and
thoracic spine were obtained with and without intravenous contrast.
CONTRAST:
15 ML MULTIHANCE

[Series 3: T1 · sagittal · 3.0mm · 0.90mm/px · 4 of 15 slices shown (1 of 3)]
[im 1/15]
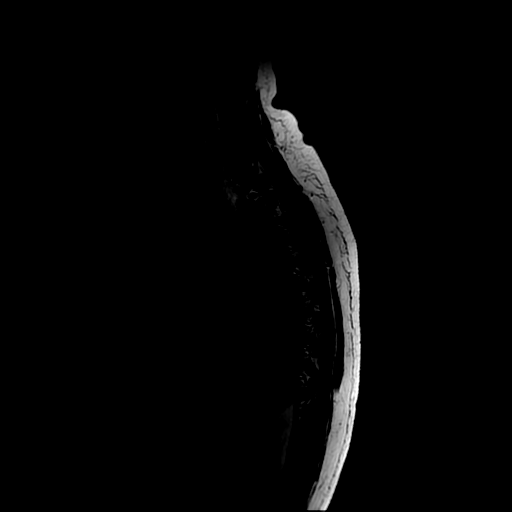
[im 5/15]
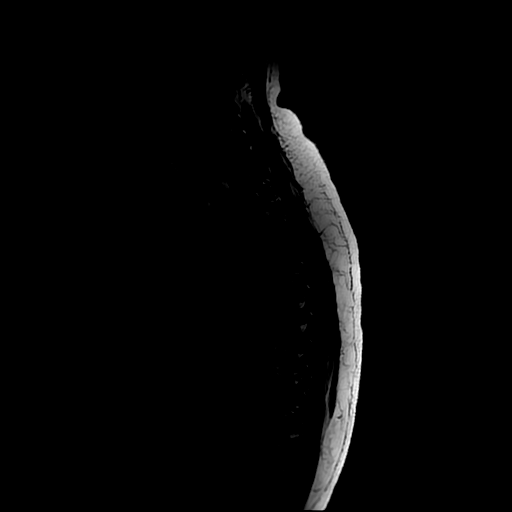
[im 10/15]
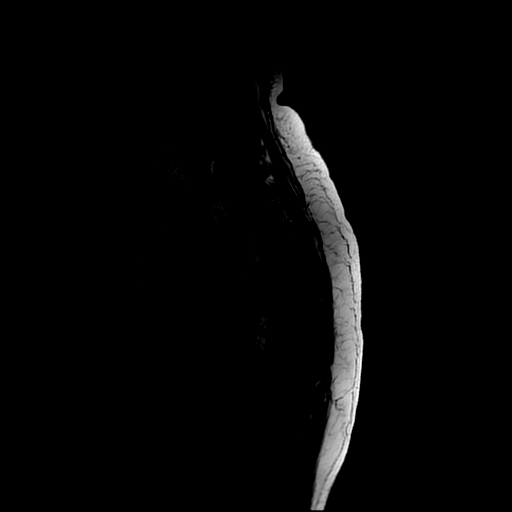
[im 15/15]
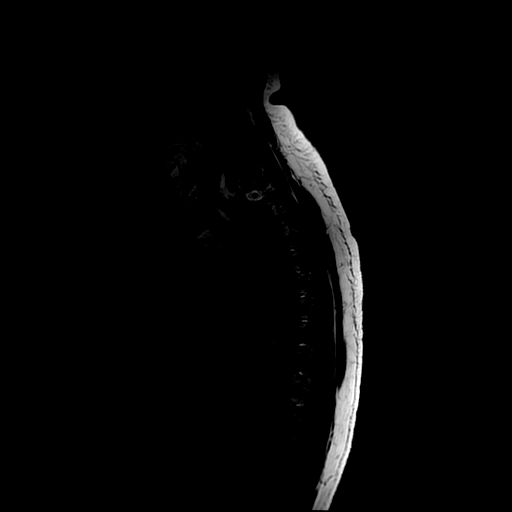

[Series 4: T2 · sagittal · 3.0mm · 0.59mm/px · 2 of 13 slices shown (1 of 3)]
[im 1/13]
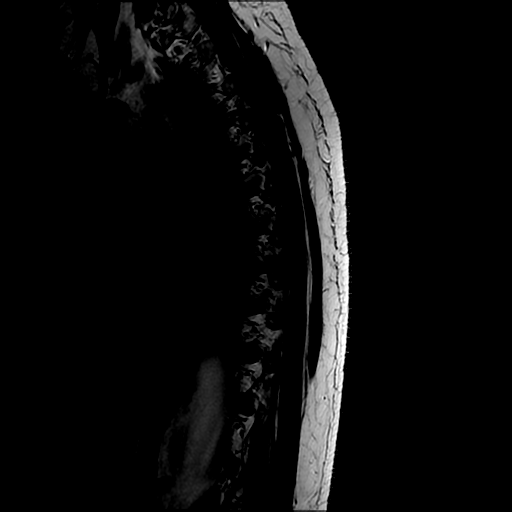
[im 13/13]
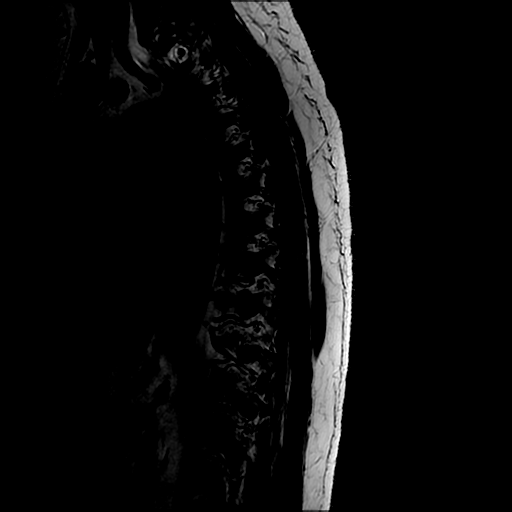

[Series 6: T1 · sagittal · 3.0mm · 0.59mm/px · 2 of 13 slices shown (2 of 3)]
[im 1/13]
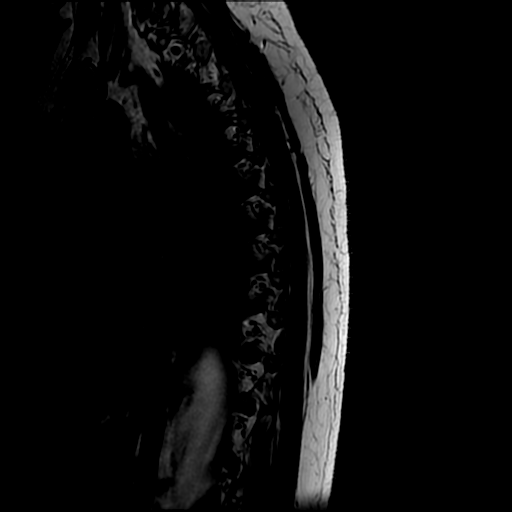
[im 13/13]
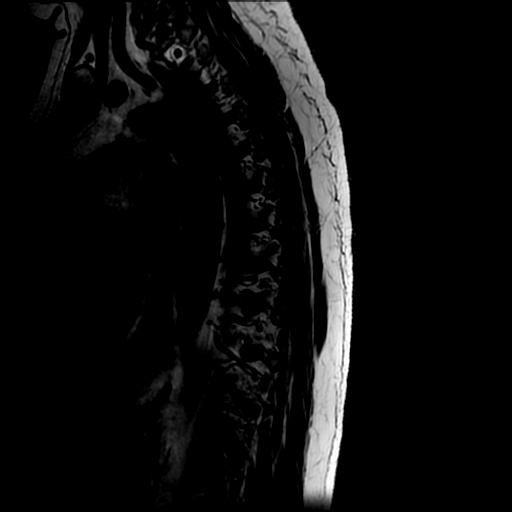

[Series 7: T2 · axial · 4.0mm · 0.39mm/px · z∈[-78,+28]mm · 4 of 22 slices shown (2 of 3)]
[im 1/22]
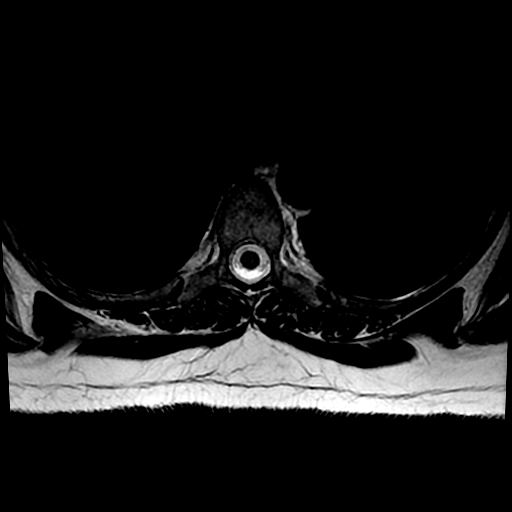
[im 8/22]
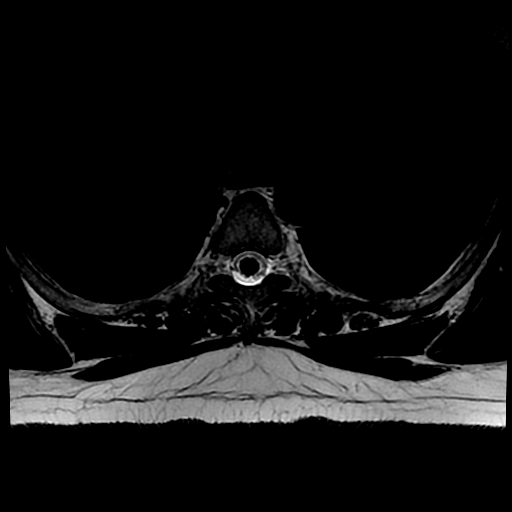
[im 15/22]
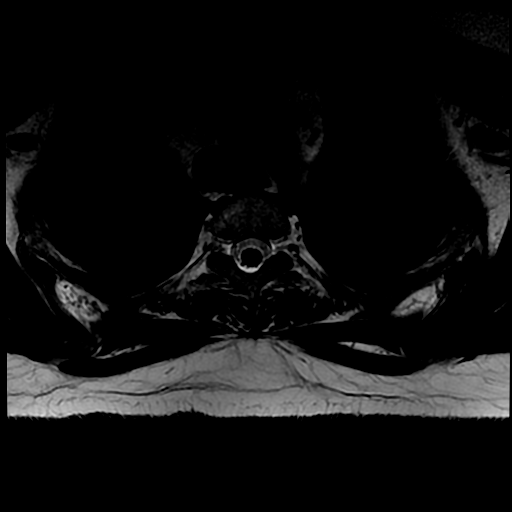
[im 22/22]
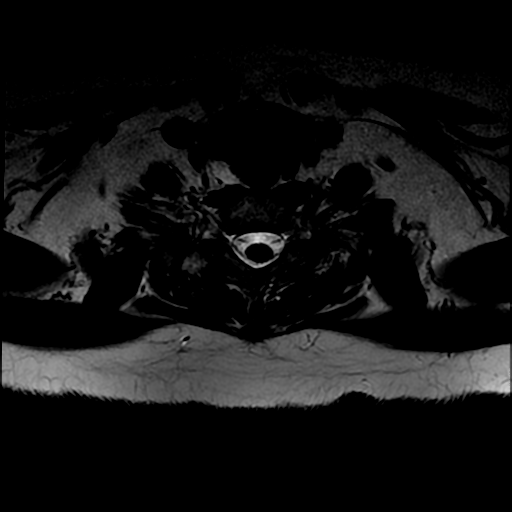

[Series 9: T1 · axial · non-contrast · 4.0mm · 0.39mm/px · z∈[-78,-43]mm · 2 of 22 slices shown (3 of 3)]
[im 1/22]
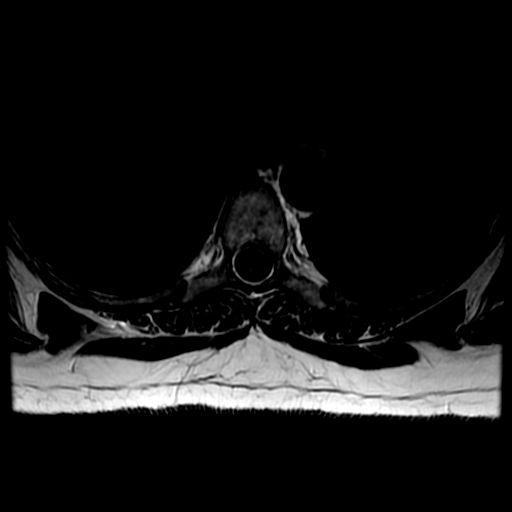
[im 8/22]
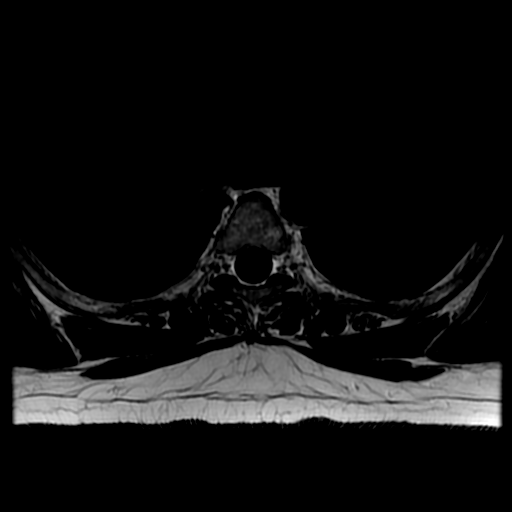

[Series 10: T2 · axial · 4.0mm · 0.39mm/px · z∈[-199,-30]mm · 5 of 32 slices shown (3 of 3)]
[im 1/32]
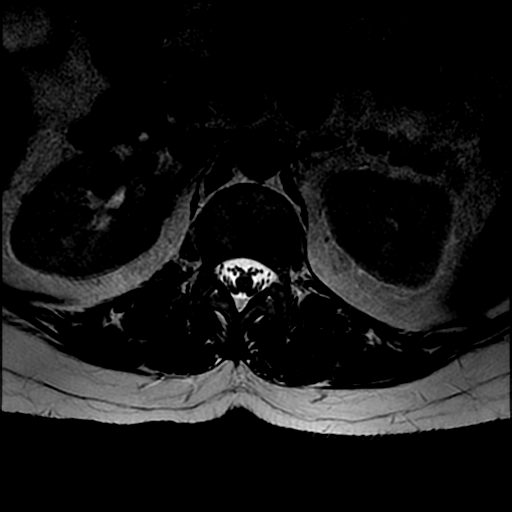
[im 8/32]
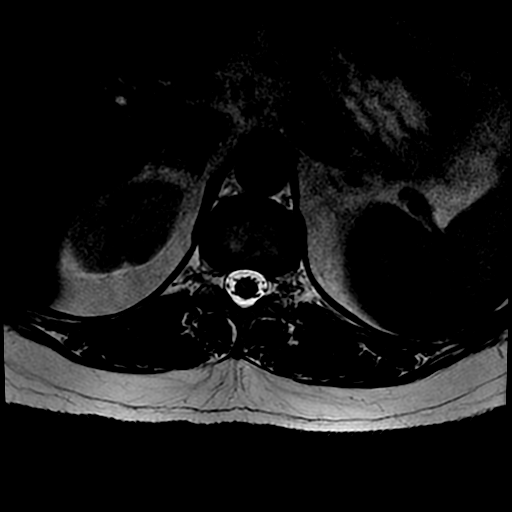
[im 16/32]
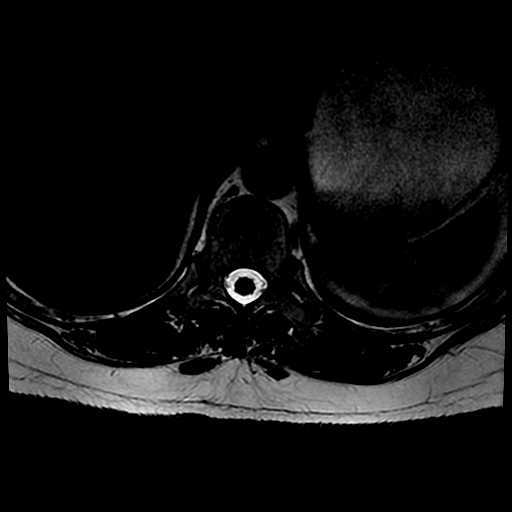
[im 24/32]
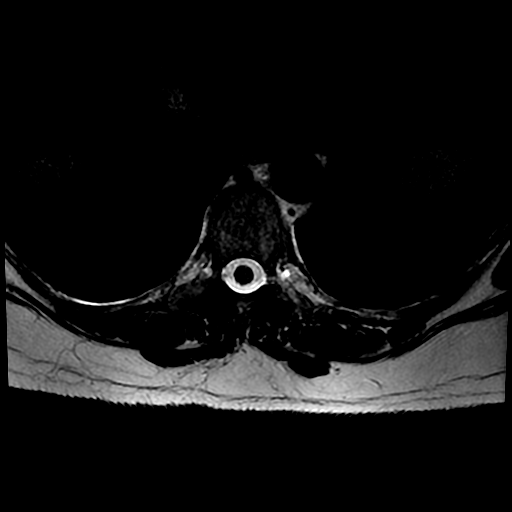
[im 32/32]
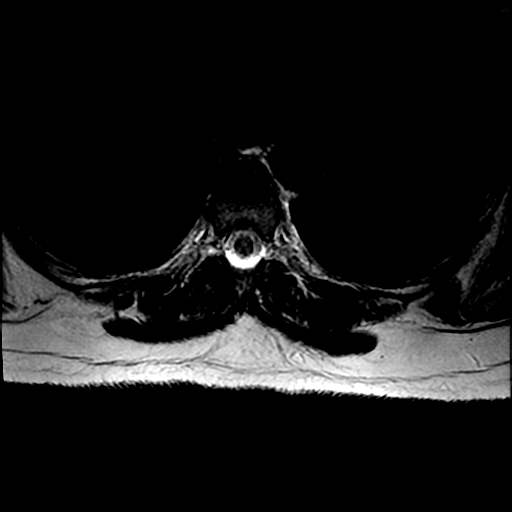

[19 of 48 positions shown; findings below may reference images not displayed]

FINDINGS: MRI CERVICAL SPINE FINDINGS

Alignment: No subluxation. Mild reversal of the normal cervical
lordosis.

Vertebrae: No fracture, evidence of discitis, or bone lesion.

Cord: Normal signal and morphology.

Posterior Fossa, vertebral arteries, paraspinal tissues: Negative.

Disc levels:

C1-C2: Normal.

C2-C3: Normal disc space and facets. No spinal canal or
neuroforaminal stenosis.

C3-C4: Small central disc protrusion effaces the ventral thecal sac
and indents the spinal cord. No spinal canal stenosis. No neural
foraminal stenosis.

C4-C5: Small central disc protrusion narrows the ventral thecal sac.
No spinal canal or neural foraminal stenosis.

C5-C6: Disc desiccation without spinal canal or neuroforaminal
stenosis.

C6-C7: Disc desiccation without spinal canal or neuroforaminal
stenosis.

C7-T1: Normal disc space and facets. No spinal canal or
neuroforaminal stenosis.

MRI THORACIC SPINE FINDINGS

Alignment:  Normal

Vertebrae: There are hemangiomata within the T5 and T9 vertebral
bodies. No low T1 weighted signal lesions. No contrast-enhancing
lesions.

Cord:  Normal

Paraspinal and other soft tissues: Negative.

Disc levels:

No disc herniation, spinal canal stenosis or neural foraminal
narrowing.
IMPRESSION: 1. No acute abnormality of the cervical or thoracic spine. No spinal
canal or neural foraminal stenosis.
2. No contrast-enhancing lesions.
3. Mild midcervical degenerative disc disease without stenosis.

## 2018-09-17 IMAGING — MR MR CERVICAL SPINE WO/W CM
4 of 8 series · 19 of 48 positions shown · IV contrast (multihance)
Comparison: None.

CLINICAL DATA: Worsening back pain radiating to the left buttocks
and the right hand. History of breast cancer.

EXAM:
MRI CERVICAL SPINE WITH AND WITHOUT CONTRAST
MRI THORACIC SPINE WITH AND WITHOUT CONTRAST
TECHNIQUE: Multiplanar and multiecho pulse sequences of the cervical and
thoracic spine were obtained with and without intravenous contrast.
CONTRAST:
15 ML MULTIHANCE

[Series 2: T2 · sagittal · 3.0mm · 0.43mm/px · 3 of 17 slices shown (1 of 2)]
[im 1/17]
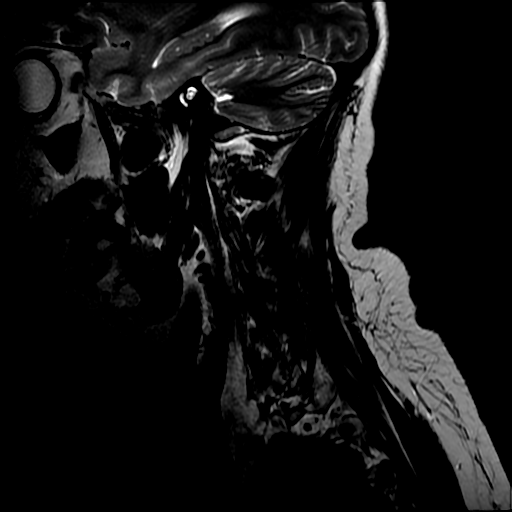
[im 9/17]
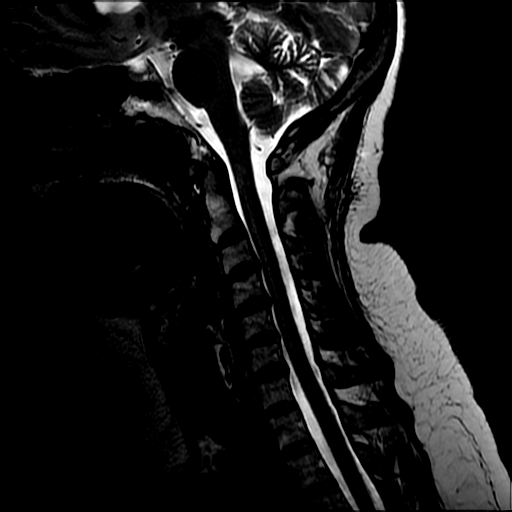
[im 17/17]
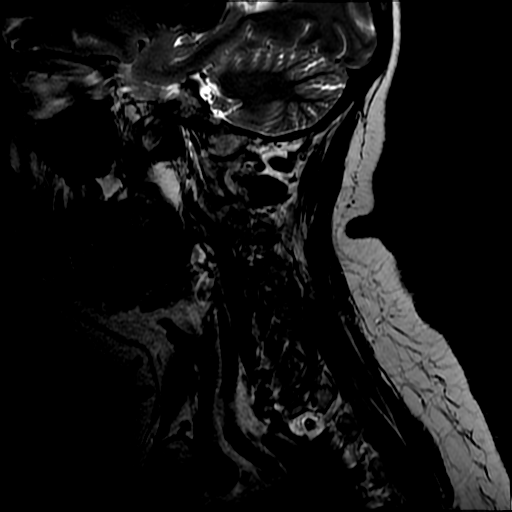

[Series 3: T1 fat-sat post-contrast · sagittal · 3.0mm · 0.43mm/px · 4 of 17 slices shown]
[im 1/17]
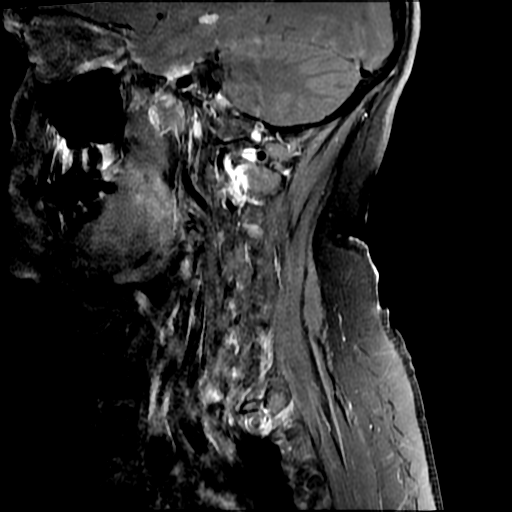
[im 6/17]
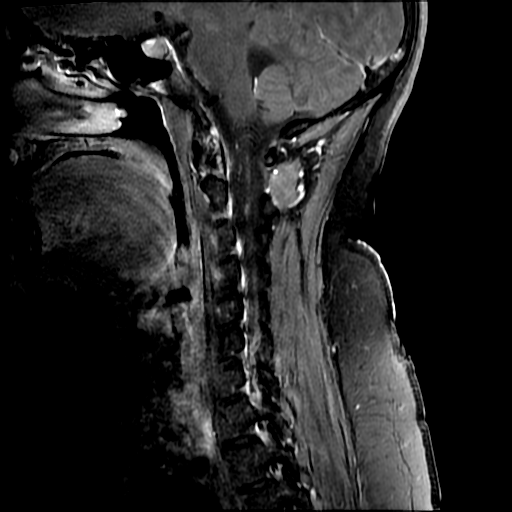
[im 11/17]
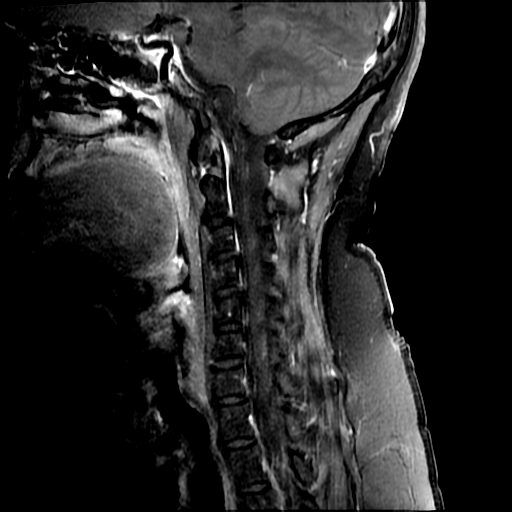
[im 17/17]
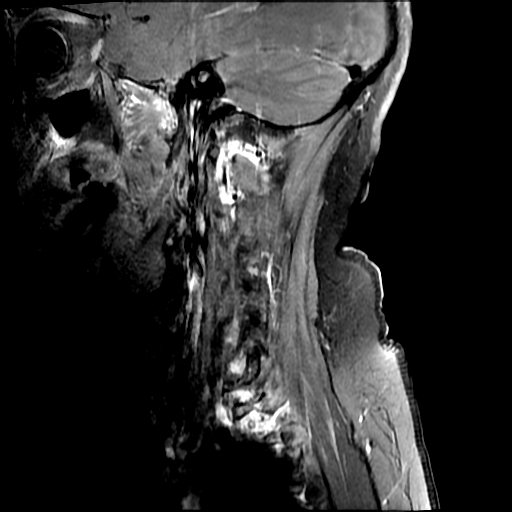

[Series 6: T2 · axial · 3.0mm · 0.35mm/px · z∈[-92,+13]mm · 8 of 33 slices shown (2 of 2)]
[im 1/33]
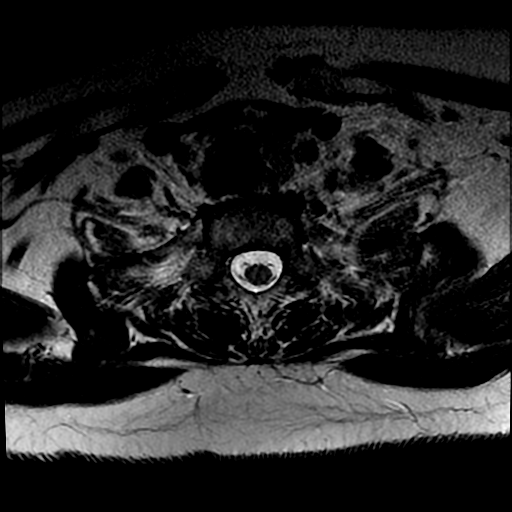
[im 5/33]
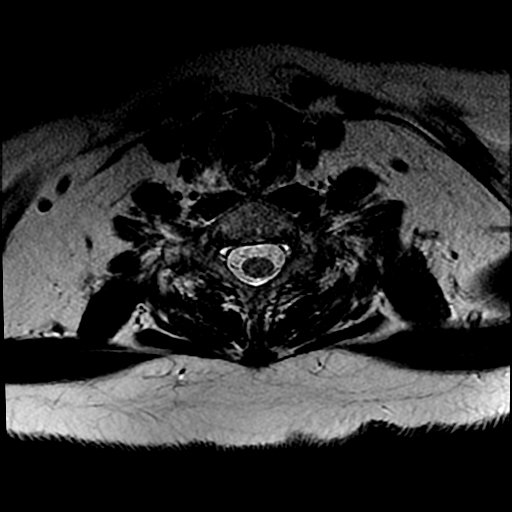
[im 10/33]
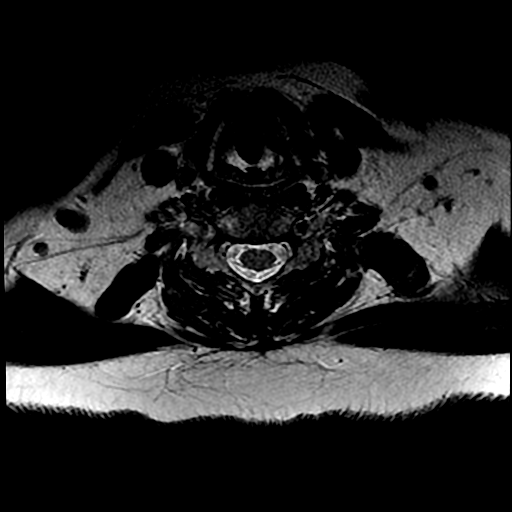
[im 14/33]
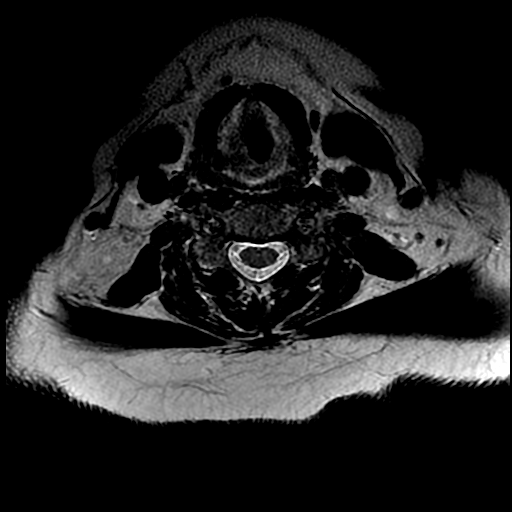
[im 19/33]
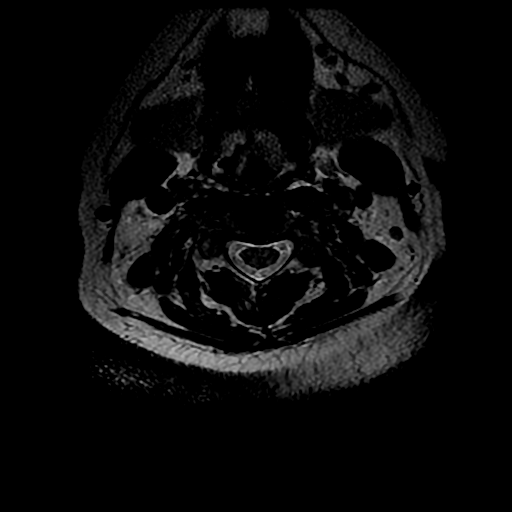
[im 23/33]
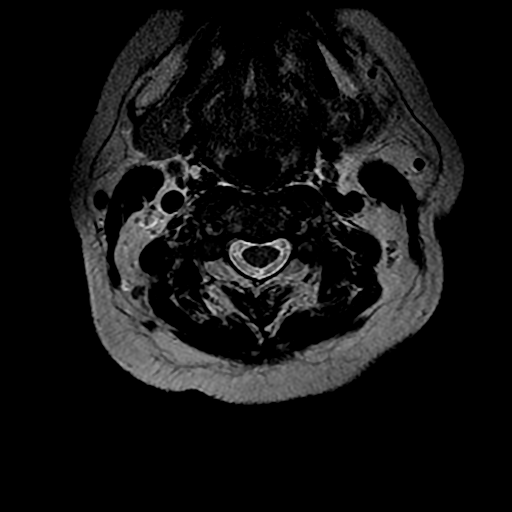
[im 28/33]
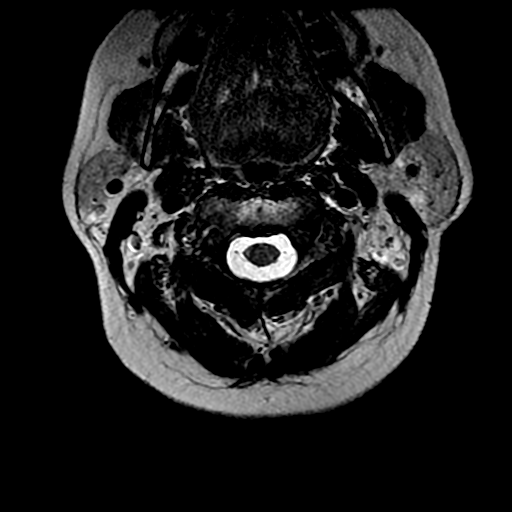
[im 33/33]
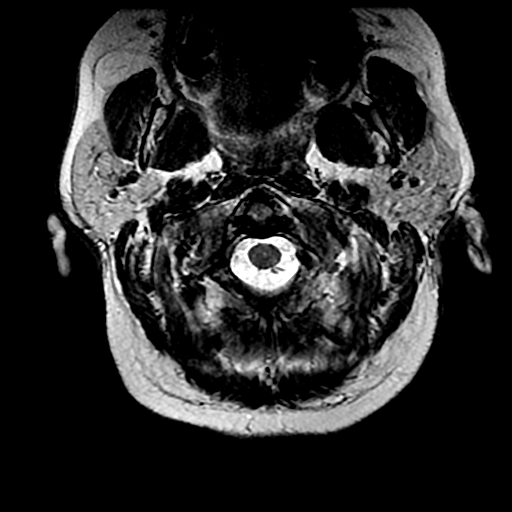

[Series 7: T1 · axial · non-contrast · 3.0mm · 0.35mm/px · z∈[-92,-3]mm · 4 of 33 slices shown]
[im 1/33]
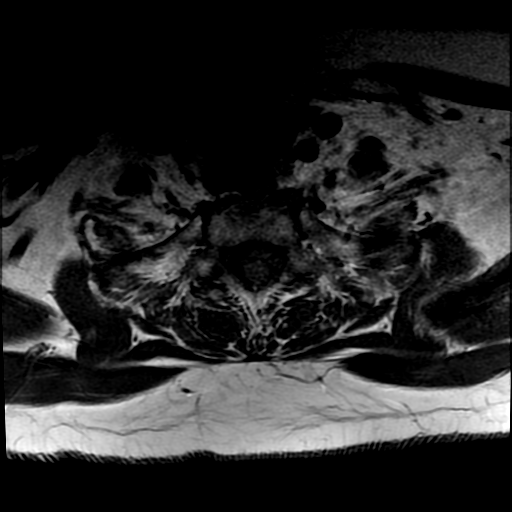
[im 5/33]
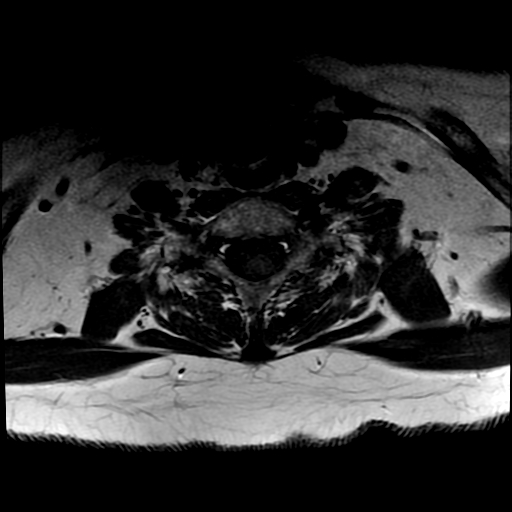
[im 19/33]
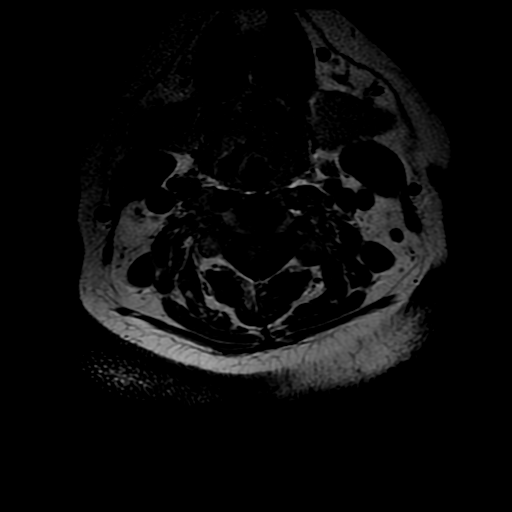
[im 28/33]
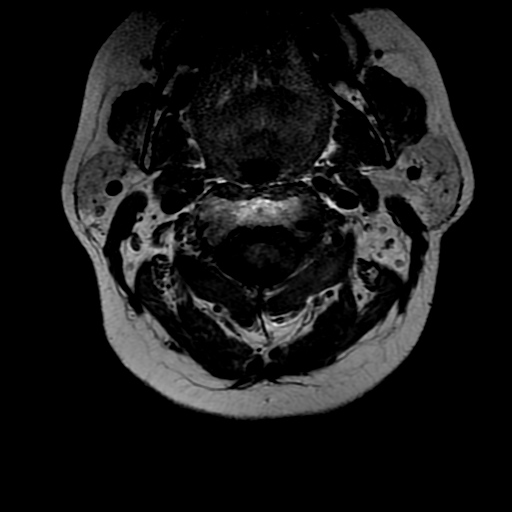

[19 of 48 positions shown; findings below may reference images not displayed]

FINDINGS: MRI CERVICAL SPINE FINDINGS

Alignment: No subluxation. Mild reversal of the normal cervical
lordosis.

Vertebrae: No fracture, evidence of discitis, or bone lesion.

Cord: Normal signal and morphology.

Posterior Fossa, vertebral arteries, paraspinal tissues: Negative.

Disc levels:

C1-C2: Normal.

C2-C3: Normal disc space and facets. No spinal canal or
neuroforaminal stenosis.

C3-C4: Small central disc protrusion effaces the ventral thecal sac
and indents the spinal cord. No spinal canal stenosis. No neural
foraminal stenosis.

C4-C5: Small central disc protrusion narrows the ventral thecal sac.
No spinal canal or neural foraminal stenosis.

C5-C6: Disc desiccation without spinal canal or neuroforaminal
stenosis.

C6-C7: Disc desiccation without spinal canal or neuroforaminal
stenosis.

C7-T1: Normal disc space and facets. No spinal canal or
neuroforaminal stenosis.

MRI THORACIC SPINE FINDINGS

Alignment:  Normal

Vertebrae: There are hemangiomata within the T5 and T9 vertebral
bodies. No low T1 weighted signal lesions. No contrast-enhancing
lesions.

Cord:  Normal

Paraspinal and other soft tissues: Negative.

Disc levels:

No disc herniation, spinal canal stenosis or neural foraminal
narrowing.
IMPRESSION: 1. No acute abnormality of the cervical or thoracic spine. No spinal
canal or neural foraminal stenosis.
2. No contrast-enhancing lesions.
3. Mild midcervical degenerative disc disease without stenosis.

## 2018-09-22 DIAGNOSIS — C7949 Secondary malignant neoplasm of other parts of nervous system: Secondary | ICD-10-CM | POA: Diagnosis not present

## 2018-09-22 DIAGNOSIS — Z23 Encounter for immunization: Secondary | ICD-10-CM | POA: Diagnosis not present

## 2018-10-21 DIAGNOSIS — C7949 Secondary malignant neoplasm of other parts of nervous system: Secondary | ICD-10-CM | POA: Diagnosis not present

## 2018-10-29 DIAGNOSIS — H04561 Stenosis of right lacrimal punctum: Secondary | ICD-10-CM | POA: Diagnosis not present

## 2018-11-15 DIAGNOSIS — C7949 Secondary malignant neoplasm of other parts of nervous system: Secondary | ICD-10-CM | POA: Diagnosis not present

## 2018-11-15 DIAGNOSIS — C801 Malignant (primary) neoplasm, unspecified: Secondary | ICD-10-CM | POA: Diagnosis not present

## 2018-11-15 DIAGNOSIS — H04123 Dry eye syndrome of bilateral lacrimal glands: Secondary | ICD-10-CM | POA: Diagnosis not present

## 2018-11-15 DIAGNOSIS — Z23 Encounter for immunization: Secondary | ICD-10-CM | POA: Diagnosis not present

## 2018-11-16 DIAGNOSIS — Z23 Encounter for immunization: Secondary | ICD-10-CM | POA: Diagnosis not present

## 2018-11-16 DIAGNOSIS — Z5111 Encounter for antineoplastic chemotherapy: Secondary | ICD-10-CM | POA: Diagnosis not present

## 2018-11-16 DIAGNOSIS — C7949 Secondary malignant neoplasm of other parts of nervous system: Secondary | ICD-10-CM | POA: Diagnosis not present

## 2018-11-16 DIAGNOSIS — C50411 Malignant neoplasm of upper-outer quadrant of right female breast: Secondary | ICD-10-CM | POA: Diagnosis not present

## 2018-11-16 DIAGNOSIS — C801 Malignant (primary) neoplasm, unspecified: Secondary | ICD-10-CM | POA: Diagnosis not present

## 2018-12-14 DIAGNOSIS — C7949 Secondary malignant neoplasm of other parts of nervous system: Secondary | ICD-10-CM | POA: Diagnosis not present

## 2019-01-11 DIAGNOSIS — C7949 Secondary malignant neoplasm of other parts of nervous system: Secondary | ICD-10-CM | POA: Diagnosis not present

## 2019-01-17 DIAGNOSIS — J029 Acute pharyngitis, unspecified: Secondary | ICD-10-CM | POA: Diagnosis not present

## 2019-01-18 DIAGNOSIS — J069 Acute upper respiratory infection, unspecified: Secondary | ICD-10-CM | POA: Diagnosis not present

## 2019-01-31 DIAGNOSIS — H04221 Epiphora due to insufficient drainage, right lacrimal gland: Secondary | ICD-10-CM | POA: Diagnosis not present

## 2019-02-08 DIAGNOSIS — C7949 Secondary malignant neoplasm of other parts of nervous system: Secondary | ICD-10-CM | POA: Diagnosis not present

## 2019-02-24 DIAGNOSIS — Z923 Personal history of irradiation: Secondary | ICD-10-CM | POA: Diagnosis not present

## 2019-02-24 DIAGNOSIS — H04221 Epiphora due to insufficient drainage, right lacrimal gland: Secondary | ICD-10-CM | POA: Diagnosis not present

## 2019-02-24 DIAGNOSIS — C50919 Malignant neoplasm of unspecified site of unspecified female breast: Secondary | ICD-10-CM | POA: Diagnosis not present

## 2019-02-24 DIAGNOSIS — H04541 Stenosis of right lacrimal canaliculi: Secondary | ICD-10-CM | POA: Diagnosis not present

## 2019-03-09 DIAGNOSIS — C7949 Secondary malignant neoplasm of other parts of nervous system: Secondary | ICD-10-CM | POA: Diagnosis not present

## 2019-04-05 DIAGNOSIS — C7949 Secondary malignant neoplasm of other parts of nervous system: Secondary | ICD-10-CM | POA: Diagnosis not present

## 2019-05-03 DIAGNOSIS — C7949 Secondary malignant neoplasm of other parts of nervous system: Secondary | ICD-10-CM | POA: Diagnosis not present

## 2019-05-04 DIAGNOSIS — H04541 Stenosis of right lacrimal canaliculi: Secondary | ICD-10-CM | POA: Diagnosis not present

## 2019-05-04 DIAGNOSIS — H04223 Epiphora due to insufficient drainage, bilateral lacrimal glands: Secondary | ICD-10-CM | POA: Diagnosis not present

## 2020-04-17 ENCOUNTER — Other Ambulatory Visit: Payer: Self-pay | Admitting: Internal Medicine

## 2020-04-17 DIAGNOSIS — Z1231 Encounter for screening mammogram for malignant neoplasm of breast: Secondary | ICD-10-CM

## 2021-11-14 DEATH — deceased
